# Patient Record
Sex: Male | Born: 1950 | Race: White | Hispanic: No | Marital: Married | Smoking: Former smoker
Health system: Southern US, Community
[De-identification: ages and names within clinical notes are randomized; demographics above are authoritative.]

## PROBLEM LIST (undated history)

## (undated) DIAGNOSIS — I2510 Atherosclerotic heart disease of native coronary artery without angina pectoris: Secondary | ICD-10-CM

## (undated) DIAGNOSIS — E279 Disorder of adrenal gland, unspecified: Secondary | ICD-10-CM

## (undated) DIAGNOSIS — I872 Venous insufficiency (chronic) (peripheral): Secondary | ICD-10-CM

## (undated) DIAGNOSIS — J45909 Unspecified asthma, uncomplicated: Secondary | ICD-10-CM

## (undated) DIAGNOSIS — I9589 Other hypotension: Secondary | ICD-10-CM

## (undated) DIAGNOSIS — G4733 Obstructive sleep apnea (adult) (pediatric): Secondary | ICD-10-CM

## (undated) DIAGNOSIS — K759 Inflammatory liver disease, unspecified: Secondary | ICD-10-CM

## (undated) DIAGNOSIS — J449 Chronic obstructive pulmonary disease, unspecified: Secondary | ICD-10-CM

## (undated) DIAGNOSIS — K279 Peptic ulcer, site unspecified, unspecified as acute or chronic, without hemorrhage or perforation: Secondary | ICD-10-CM

## (undated) DIAGNOSIS — E785 Hyperlipidemia, unspecified: Secondary | ICD-10-CM

## (undated) DIAGNOSIS — I219 Acute myocardial infarction, unspecified: Secondary | ICD-10-CM

## (undated) DIAGNOSIS — F5101 Primary insomnia: Secondary | ICD-10-CM

## (undated) DIAGNOSIS — I110 Hypertensive heart disease with heart failure: Secondary | ICD-10-CM

## (undated) DIAGNOSIS — R001 Bradycardia, unspecified: Secondary | ICD-10-CM

## (undated) DIAGNOSIS — E119 Type 2 diabetes mellitus without complications: Secondary | ICD-10-CM

## (undated) HISTORY — DX: Atherosclerotic heart disease of native coronary artery without angina pectoris: I25.10

## (undated) HISTORY — DX: Hypertensive heart disease with heart failure: I11.0

## (undated) HISTORY — DX: Type 2 diabetes mellitus without complications: E11.9

## (undated) HISTORY — PX: CARDIAC CATHETERIZATION: SHX172

## (undated) HISTORY — DX: Chronic obstructive pulmonary disease, unspecified: J44.9

## (undated) HISTORY — DX: Venous insufficiency (chronic) (peripheral): I87.2

## (undated) HISTORY — DX: Obstructive sleep apnea (adult) (pediatric): G47.33

## (undated) HISTORY — PX: CORONARY ARTERY BYPASS GRAFT: SHX141

## (undated) HISTORY — DX: Primary insomnia: F51.01

## (undated) HISTORY — DX: Other hypotension: I95.89

## (undated) HISTORY — DX: Peptic ulcer, site unspecified, unspecified as acute or chronic, without hemorrhage or perforation: K27.9

## (undated) HISTORY — DX: Hyperlipidemia, unspecified: E78.5

## (undated) HISTORY — DX: Disorder of adrenal gland, unspecified: E27.9

## (undated) HISTORY — DX: Acute myocardial infarction, unspecified: I21.9

## (undated) HISTORY — PX: CORONARY ANGIOPLASTY: SHX604

## (undated) HISTORY — PX: CATARACT EXTRACTION, BILATERAL: SHX1313

## (undated) HISTORY — DX: Unspecified asthma, uncomplicated: J45.909

## (undated) HISTORY — DX: Bradycardia, unspecified: R00.1

## (undated) HISTORY — PX: CARPAL TUNNEL RELEASE: SHX101

## (undated) HISTORY — DX: Inflammatory liver disease, unspecified: K75.9

---

## 2014-12-08 DIAGNOSIS — I2510 Atherosclerotic heart disease of native coronary artery without angina pectoris: Secondary | ICD-10-CM

## 2014-12-08 DIAGNOSIS — E785 Hyperlipidemia, unspecified: Secondary | ICD-10-CM

## 2014-12-08 HISTORY — DX: Atherosclerotic heart disease of native coronary artery without angina pectoris: I25.10

## 2014-12-08 HISTORY — DX: Hyperlipidemia, unspecified: E78.5

## 2015-03-03 DIAGNOSIS — I10 Essential (primary) hypertension: Secondary | ICD-10-CM

## 2015-03-03 HISTORY — DX: Essential (primary) hypertension: I10

## 2015-03-12 DIAGNOSIS — F419 Anxiety disorder, unspecified: Secondary | ICD-10-CM | POA: Diagnosis not present

## 2015-03-12 DIAGNOSIS — I110 Hypertensive heart disease with heart failure: Secondary | ICD-10-CM | POA: Diagnosis not present

## 2015-03-12 DIAGNOSIS — E785 Hyperlipidemia, unspecified: Secondary | ICD-10-CM | POA: Diagnosis not present

## 2015-03-12 DIAGNOSIS — R7303 Prediabetes: Secondary | ICD-10-CM | POA: Diagnosis not present

## 2015-03-12 DIAGNOSIS — I509 Heart failure, unspecified: Secondary | ICD-10-CM | POA: Diagnosis not present

## 2015-03-12 DIAGNOSIS — Z79899 Other long term (current) drug therapy: Secondary | ICD-10-CM | POA: Diagnosis not present

## 2015-03-12 DIAGNOSIS — F5105 Insomnia due to other mental disorder: Secondary | ICD-10-CM | POA: Diagnosis not present

## 2015-03-12 DIAGNOSIS — N401 Enlarged prostate with lower urinary tract symptoms: Secondary | ICD-10-CM | POA: Diagnosis not present

## 2015-03-12 DIAGNOSIS — E559 Vitamin D deficiency, unspecified: Secondary | ICD-10-CM | POA: Diagnosis not present

## 2015-06-30 DIAGNOSIS — Z0000 Encounter for general adult medical examination without abnormal findings: Secondary | ICD-10-CM | POA: Diagnosis not present

## 2015-06-30 DIAGNOSIS — Z6841 Body Mass Index (BMI) 40.0 and over, adult: Secondary | ICD-10-CM | POA: Diagnosis not present

## 2015-06-30 DIAGNOSIS — E785 Hyperlipidemia, unspecified: Secondary | ICD-10-CM | POA: Diagnosis not present

## 2015-06-30 DIAGNOSIS — Z1212 Encounter for screening for malignant neoplasm of rectum: Secondary | ICD-10-CM | POA: Diagnosis not present

## 2015-06-30 DIAGNOSIS — I10 Essential (primary) hypertension: Secondary | ICD-10-CM | POA: Diagnosis not present

## 2015-06-30 DIAGNOSIS — Z125 Encounter for screening for malignant neoplasm of prostate: Secondary | ICD-10-CM | POA: Diagnosis not present

## 2015-06-30 DIAGNOSIS — Z1211 Encounter for screening for malignant neoplasm of colon: Secondary | ICD-10-CM | POA: Diagnosis not present

## 2015-06-30 DIAGNOSIS — E559 Vitamin D deficiency, unspecified: Secondary | ICD-10-CM | POA: Diagnosis not present

## 2015-06-30 DIAGNOSIS — R7303 Prediabetes: Secondary | ICD-10-CM | POA: Diagnosis not present

## 2015-07-26 DIAGNOSIS — E785 Hyperlipidemia, unspecified: Secondary | ICD-10-CM | POA: Diagnosis not present

## 2015-07-26 DIAGNOSIS — Z79899 Other long term (current) drug therapy: Secondary | ICD-10-CM | POA: Diagnosis not present

## 2015-07-26 DIAGNOSIS — Z125 Encounter for screening for malignant neoplasm of prostate: Secondary | ICD-10-CM | POA: Diagnosis not present

## 2015-07-26 DIAGNOSIS — I509 Heart failure, unspecified: Secondary | ICD-10-CM | POA: Diagnosis not present

## 2015-07-26 DIAGNOSIS — Z1389 Encounter for screening for other disorder: Secondary | ICD-10-CM | POA: Diagnosis not present

## 2015-07-26 DIAGNOSIS — R7303 Prediabetes: Secondary | ICD-10-CM | POA: Diagnosis not present

## 2015-07-26 DIAGNOSIS — Z0000 Encounter for general adult medical examination without abnormal findings: Secondary | ICD-10-CM | POA: Diagnosis not present

## 2015-07-26 DIAGNOSIS — N401 Enlarged prostate with lower urinary tract symptoms: Secondary | ICD-10-CM | POA: Diagnosis not present

## 2015-07-26 DIAGNOSIS — I110 Hypertensive heart disease with heart failure: Secondary | ICD-10-CM | POA: Diagnosis not present

## 2015-07-26 DIAGNOSIS — Z6841 Body Mass Index (BMI) 40.0 and over, adult: Secondary | ICD-10-CM | POA: Diagnosis not present

## 2015-07-26 DIAGNOSIS — Z136 Encounter for screening for cardiovascular disorders: Secondary | ICD-10-CM | POA: Diagnosis not present

## 2015-07-26 DIAGNOSIS — Z9181 History of falling: Secondary | ICD-10-CM | POA: Diagnosis not present

## 2015-08-03 DIAGNOSIS — Z136 Encounter for screening for cardiovascular disorders: Secondary | ICD-10-CM | POA: Diagnosis not present

## 2015-08-03 DIAGNOSIS — I77811 Abdominal aortic ectasia: Secondary | ICD-10-CM | POA: Diagnosis not present

## 2015-08-03 DIAGNOSIS — Z87891 Personal history of nicotine dependence: Secondary | ICD-10-CM | POA: Diagnosis not present

## 2015-08-03 DIAGNOSIS — I700 Atherosclerosis of aorta: Secondary | ICD-10-CM | POA: Diagnosis not present

## 2015-08-16 DIAGNOSIS — Z961 Presence of intraocular lens: Secondary | ICD-10-CM | POA: Diagnosis not present

## 2015-11-11 DIAGNOSIS — Z6841 Body Mass Index (BMI) 40.0 and over, adult: Secondary | ICD-10-CM | POA: Diagnosis not present

## 2015-11-11 DIAGNOSIS — I2510 Atherosclerotic heart disease of native coronary artery without angina pectoris: Secondary | ICD-10-CM | POA: Diagnosis not present

## 2015-11-11 DIAGNOSIS — I10 Essential (primary) hypertension: Secondary | ICD-10-CM | POA: Diagnosis not present

## 2015-11-11 DIAGNOSIS — E785 Hyperlipidemia, unspecified: Secondary | ICD-10-CM | POA: Diagnosis not present

## 2016-02-03 DIAGNOSIS — I10 Essential (primary) hypertension: Secondary | ICD-10-CM | POA: Diagnosis not present

## 2016-02-03 DIAGNOSIS — Z87898 Personal history of other specified conditions: Secondary | ICD-10-CM | POA: Diagnosis not present

## 2016-02-03 DIAGNOSIS — F5105 Insomnia due to other mental disorder: Secondary | ICD-10-CM | POA: Diagnosis not present

## 2016-02-03 DIAGNOSIS — E785 Hyperlipidemia, unspecified: Secondary | ICD-10-CM | POA: Diagnosis not present

## 2016-02-03 DIAGNOSIS — F419 Anxiety disorder, unspecified: Secondary | ICD-10-CM | POA: Diagnosis not present

## 2016-02-03 DIAGNOSIS — R7303 Prediabetes: Secondary | ICD-10-CM | POA: Diagnosis not present

## 2016-02-03 DIAGNOSIS — N401 Enlarged prostate with lower urinary tract symptoms: Secondary | ICD-10-CM | POA: Diagnosis not present

## 2016-05-18 DIAGNOSIS — I10 Essential (primary) hypertension: Secondary | ICD-10-CM | POA: Diagnosis not present

## 2016-05-18 DIAGNOSIS — I2510 Atherosclerotic heart disease of native coronary artery without angina pectoris: Secondary | ICD-10-CM | POA: Diagnosis not present

## 2016-05-18 DIAGNOSIS — E785 Hyperlipidemia, unspecified: Secondary | ICD-10-CM | POA: Diagnosis not present

## 2016-05-24 DIAGNOSIS — I10 Essential (primary) hypertension: Secondary | ICD-10-CM | POA: Diagnosis not present

## 2016-05-24 DIAGNOSIS — E785 Hyperlipidemia, unspecified: Secondary | ICD-10-CM | POA: Diagnosis not present

## 2016-05-24 DIAGNOSIS — I2510 Atherosclerotic heart disease of native coronary artery without angina pectoris: Secondary | ICD-10-CM | POA: Diagnosis not present

## 2016-06-21 DIAGNOSIS — G4733 Obstructive sleep apnea (adult) (pediatric): Secondary | ICD-10-CM | POA: Diagnosis not present

## 2016-07-19 DIAGNOSIS — J301 Allergic rhinitis due to pollen: Secondary | ICD-10-CM | POA: Diagnosis not present

## 2016-07-19 DIAGNOSIS — G4733 Obstructive sleep apnea (adult) (pediatric): Secondary | ICD-10-CM | POA: Diagnosis not present

## 2016-07-19 DIAGNOSIS — R5383 Other fatigue: Secondary | ICD-10-CM | POA: Diagnosis not present

## 2016-07-19 DIAGNOSIS — J449 Chronic obstructive pulmonary disease, unspecified: Secondary | ICD-10-CM | POA: Diagnosis not present

## 2016-07-27 DIAGNOSIS — Z125 Encounter for screening for malignant neoplasm of prostate: Secondary | ICD-10-CM | POA: Diagnosis not present

## 2016-07-27 DIAGNOSIS — E669 Obesity, unspecified: Secondary | ICD-10-CM | POA: Diagnosis not present

## 2016-07-27 DIAGNOSIS — Z6841 Body Mass Index (BMI) 40.0 and over, adult: Secondary | ICD-10-CM | POA: Diagnosis not present

## 2016-07-27 DIAGNOSIS — I10 Essential (primary) hypertension: Secondary | ICD-10-CM | POA: Diagnosis not present

## 2016-07-27 DIAGNOSIS — E559 Vitamin D deficiency, unspecified: Secondary | ICD-10-CM | POA: Diagnosis not present

## 2016-07-27 DIAGNOSIS — Z136 Encounter for screening for cardiovascular disorders: Secondary | ICD-10-CM | POA: Diagnosis not present

## 2016-07-27 DIAGNOSIS — Z0000 Encounter for general adult medical examination without abnormal findings: Secondary | ICD-10-CM | POA: Diagnosis not present

## 2016-07-27 DIAGNOSIS — I2510 Atherosclerotic heart disease of native coronary artery without angina pectoris: Secondary | ICD-10-CM | POA: Diagnosis not present

## 2016-07-27 DIAGNOSIS — E785 Hyperlipidemia, unspecified: Secondary | ICD-10-CM | POA: Diagnosis not present

## 2016-08-15 DIAGNOSIS — I10 Essential (primary) hypertension: Secondary | ICD-10-CM | POA: Diagnosis not present

## 2016-08-15 DIAGNOSIS — E785 Hyperlipidemia, unspecified: Secondary | ICD-10-CM | POA: Diagnosis not present

## 2016-08-15 DIAGNOSIS — Z6841 Body Mass Index (BMI) 40.0 and over, adult: Secondary | ICD-10-CM | POA: Diagnosis not present

## 2016-08-15 DIAGNOSIS — I2510 Atherosclerotic heart disease of native coronary artery without angina pectoris: Secondary | ICD-10-CM | POA: Diagnosis not present

## 2016-08-23 DIAGNOSIS — G4733 Obstructive sleep apnea (adult) (pediatric): Secondary | ICD-10-CM | POA: Diagnosis not present

## 2016-08-30 DIAGNOSIS — J449 Chronic obstructive pulmonary disease, unspecified: Secondary | ICD-10-CM | POA: Diagnosis not present

## 2016-08-30 DIAGNOSIS — J301 Allergic rhinitis due to pollen: Secondary | ICD-10-CM | POA: Diagnosis not present

## 2016-08-30 DIAGNOSIS — R5383 Other fatigue: Secondary | ICD-10-CM | POA: Diagnosis not present

## 2016-08-30 DIAGNOSIS — G4733 Obstructive sleep apnea (adult) (pediatric): Secondary | ICD-10-CM | POA: Diagnosis not present

## 2016-09-04 DIAGNOSIS — F5105 Insomnia due to other mental disorder: Secondary | ICD-10-CM | POA: Diagnosis not present

## 2016-09-04 DIAGNOSIS — Z79899 Other long term (current) drug therapy: Secondary | ICD-10-CM | POA: Diagnosis not present

## 2016-09-04 DIAGNOSIS — G4733 Obstructive sleep apnea (adult) (pediatric): Secondary | ICD-10-CM | POA: Diagnosis not present

## 2016-09-04 DIAGNOSIS — F419 Anxiety disorder, unspecified: Secondary | ICD-10-CM | POA: Diagnosis not present

## 2016-09-04 DIAGNOSIS — E785 Hyperlipidemia, unspecified: Secondary | ICD-10-CM | POA: Diagnosis not present

## 2016-09-04 DIAGNOSIS — N401 Enlarged prostate with lower urinary tract symptoms: Secondary | ICD-10-CM | POA: Diagnosis not present

## 2016-09-04 DIAGNOSIS — R7303 Prediabetes: Secondary | ICD-10-CM | POA: Diagnosis not present

## 2016-09-04 DIAGNOSIS — I110 Hypertensive heart disease with heart failure: Secondary | ICD-10-CM | POA: Diagnosis not present

## 2016-09-05 DIAGNOSIS — G4733 Obstructive sleep apnea (adult) (pediatric): Secondary | ICD-10-CM | POA: Diagnosis not present

## 2016-09-05 DIAGNOSIS — J449 Chronic obstructive pulmonary disease, unspecified: Secondary | ICD-10-CM | POA: Diagnosis not present

## 2016-10-11 DIAGNOSIS — G4733 Obstructive sleep apnea (adult) (pediatric): Secondary | ICD-10-CM | POA: Diagnosis not present

## 2016-10-11 DIAGNOSIS — R5383 Other fatigue: Secondary | ICD-10-CM | POA: Diagnosis not present

## 2016-10-11 DIAGNOSIS — J449 Chronic obstructive pulmonary disease, unspecified: Secondary | ICD-10-CM | POA: Diagnosis not present

## 2016-10-11 DIAGNOSIS — J301 Allergic rhinitis due to pollen: Secondary | ICD-10-CM | POA: Diagnosis not present

## 2016-10-24 DIAGNOSIS — Z1211 Encounter for screening for malignant neoplasm of colon: Secondary | ICD-10-CM | POA: Diagnosis not present

## 2016-10-24 DIAGNOSIS — J301 Allergic rhinitis due to pollen: Secondary | ICD-10-CM | POA: Diagnosis not present

## 2016-10-31 DIAGNOSIS — G4733 Obstructive sleep apnea (adult) (pediatric): Secondary | ICD-10-CM | POA: Diagnosis not present

## 2016-11-20 ENCOUNTER — Other Ambulatory Visit: Payer: Self-pay | Admitting: Cardiology

## 2016-11-28 ENCOUNTER — Encounter (INDEPENDENT_AMBULATORY_CARE_PROVIDER_SITE_OTHER): Payer: Self-pay

## 2016-11-28 ENCOUNTER — Ambulatory Visit (INDEPENDENT_AMBULATORY_CARE_PROVIDER_SITE_OTHER): Payer: Medicare Other | Admitting: Cardiology

## 2016-11-28 ENCOUNTER — Encounter: Payer: Self-pay | Admitting: Cardiology

## 2016-11-28 VITALS — BP 110/60 | HR 64 | Resp 14 | Ht 67.0 in | Wt 256.1 lb

## 2016-11-28 DIAGNOSIS — I2510 Atherosclerotic heart disease of native coronary artery without angina pectoris: Secondary | ICD-10-CM

## 2016-11-28 DIAGNOSIS — G4733 Obstructive sleep apnea (adult) (pediatric): Secondary | ICD-10-CM | POA: Diagnosis not present

## 2016-11-28 DIAGNOSIS — E785 Hyperlipidemia, unspecified: Secondary | ICD-10-CM | POA: Diagnosis not present

## 2016-11-28 DIAGNOSIS — G4730 Sleep apnea, unspecified: Secondary | ICD-10-CM

## 2016-11-28 DIAGNOSIS — I10 Essential (primary) hypertension: Secondary | ICD-10-CM | POA: Diagnosis not present

## 2016-11-28 HISTORY — DX: Sleep apnea, unspecified: G47.30

## 2016-11-28 NOTE — Addendum Note (Signed)
Addended by: Kathyrn Sheriff on: 11/28/2016 12:05 PM   Modules accepted: Orders

## 2016-11-28 NOTE — Patient Instructions (Signed)
Medication Instructions:  Your physician recommends that you continue on your current medications as directed. Please refer to the Current Medication list given to you today.  Labwork: Your physician recommends that you have lab work today to check your cholesterol, kidney function, sodium and potassium level.   Testing/Procedures: None   Follow-Up: Your physician wants you to follow-up in: 6 months. You will receive a reminder letter in the mail two months in advance. If you don't receive a letter, please call our office to schedule the follow-up appointment.  Any Other Special Instructions Will Be Listed Below (If Applicable).  Please note that any paperwork needing to be filled out by the provider will need to be addressed at the front desk prior to seeing the provider. Please note that any paperwork FMLA, Disability or other documents regarding health condition is subject to a $25.00 charge that must be received prior to completion of paperwork in the form of a money order or check.    If you need a refill on your cardiac medications before your next appointment, please call your pharmacy.

## 2016-11-28 NOTE — Progress Notes (Signed)
Cardiology Office Note:    Date:  11/28/2016   ID:  Phillip Dunn, DOB 1951-02-18, MRN [id number]  PCP:  Melony Overly, MD  Cardiologist:  Jenne Campus, MD    Referring MD: Melony Overly, MD   Chief Complaint  Patient presents with  . Follow-up  Is doing well  History of Present Illness:    Phillip Dunn is a 66 y.o. male  with coronary artery disease. He is doing fine recently he initiated treatment for sleep apnea he feels significantly better. He does have more energy, he can work more. He consented for longer period of time. Is having any chest pain, tightness, squeezing, pressure, burning in the chest.  Past Medical History:  Diagnosis Date  . Adrenal disorder ([place])   . Asthma   . Benign hypertensive heart disease with CHF (congestive heart failure) ([place])   . Bradycardia   . COPD (chronic obstructive pulmonary disease) ([place])   . Coronary artery disease   . Diabetes mellitus without complication ([place])   . Hepatitis   . Hyperlipidemia   . Myocardial infarct ([place])   . OSA (obstructive sleep apnea)   . Peptic ulcer   . Primary insomnia   . Secondary hypotension   . Venous insufficiency of left lower extremity     Past Surgical History:  Procedure Laterality Date  . CATARACT EXTRACTION, BILATERAL    . CORONARY ARTERY BYPASS GRAFT      Current Medications: Current Meds  Medication Sig  . albuterol (VENTOLIN HFA) 108 (90 Base) MCG/ACT inhaler Inhale 2 puffs into the lungs every 4 (four) hours as needed for wheezing.  Marland Kitchen aspirin EC 81 MG tablet Take 81 mg by mouth daily.  Marland Kitchen atorvastatin (LIPITOR) 80 MG tablet Take 1 tablet by mouth daily.  . Coenzyme Q10 (COQ-10) 10 MG CAPS Take 10 mg by mouth daily.  . DOCOSAHEXAENOIC ACID PO Take 1 capsule by mouth daily.  . finasteride (PROSCAR) 5 MG tablet Take 1 tablet by mouth daily.  Marland Kitchen FLUoxetine (PROZAC) 20 MG tablet Take 20 mg by mouth daily.  . fluticasone (FLONASE) 50 MCG/ACT nasal spray Place 2 sprays  into both nostrils daily.  . fluticasone furoate-vilanterol (BREO ELLIPTA) 200-25 MCG/INH AEPB Inhale 1 puff into the lungs daily.  . furosemide (LASIX) 20 MG tablet Take 20 mg by mouth daily.  Marland Kitchen lisinopril (PRINIVIL,ZESTRIL) 2.5 MG tablet Take 1 tablet by mouth daily.  . metoprolol succinate (TOPROL-XL) 25 MG 24 hr tablet Take 12.5 mg by mouth daily.  . montelukast (SINGULAIR) 10 MG tablet Take 10 mg by mouth at bedtime.  . nitroGLYCERIN (NITROSTAT) 0.4 MG SL tablet Place 0.4 mg under the tongue as needed for chest pain.  . tamsulosin (FLOMAX) 0.4 MG CAPS capsule Take 0.4 mg by mouth daily.  . Vitamin D, Ergocalciferol, (DRISDOL) 50000 units CAPS capsule Take 1 capsule by mouth once a week.     Allergies:   Pollen extract   Social History   Social History  . Marital status: Married    Spouse name: N/A  . Number of children: N/A  . Years of education: N/A   Social History Main Topics  . Smoking status: Former Research scientist (life sciences)  . Smokeless tobacco: Never Used  . Alcohol use Yes  . Drug use: No  . Sexual activity: Not Asked   Other Topics Concern  . None   Social History Narrative  . None     Family History: The patient's family history includes Stroke in his  mother. ROS:   Please see the history of present illness.    All 14 point review of systems negative except as described per history of present illness  EKGs/Labs/Other Studies Reviewed:      Recent Labs: No results found for requested labs within last 8760 hours.  Recent Lipid Panel No results found for: CHOL, TRIG, HDL, CHOLHDL, VLDL, LDLCALC, LDLDIRECT  Physical Exam:    VS:  BP 110/60   Pulse 64   Resp 14   Ht 5\' 7"  (1.702 m)   Wt 256 lb 1.9 oz (116.2 kg)   BMI 40.11 kg/m     Wt Readings from Last 3 Encounters:  11/28/16 256 lb 1.9 oz (116.2 kg)     GEN:  Well nourished, well developed in no acute distress HEENT: Normal NECK: No JVD; No carotid bruits LYMPHATICS: No lymphadenopathy CARDIAC: RRR, no  murmurs, no rubs, no gallops RESPIRATORY:  Clear to auscultation without rales, wheezing or rhonchi  ABDOMEN: Soft, non-tender, non-distended MUSCULOSKELETAL:  No edema; No deformity  SKIN: Warm and dry LOWER EXTREMITIES: no swelling NEUROLOGIC:  Alert and oriented x 3 PSYCHIATRIC:  Normal affect   ASSESSMENT:    1. Coronary artery disease involving native coronary artery of native heart without angina pectoris   2. Dyslipidemia   3. Essential hypertension   4. Obstructive sleep apnea syndrome    PLAN:    In order of problems listed above:  1. Coronary artery disease: Asymptomatic, on appropriate medications which I will continue. 2. Dyslipidemia: I will continue present management I will check his fasting profile today. 3. Essential hypertension: Blood pressure well-controlled continue present management. 4. Obstructive sleep apnea on CPAP must that he use it religiously. He feels significantly better with it.  I will check his fasting lipid profile as well as Chem-7 today   Medication Adjustments/Labs and Tests Ordered: Current medicines are reviewed at length with the patient today.  Concerns regarding medicines are outlined above.  No orders of the defined types were placed in this encounter.  Medication changes: No orders of the defined types were placed in this encounter.   Signed, Park Liter, MD, [facility] 11/28/2016 11:57 AM    [place]

## 2016-11-29 LAB — BASIC METABOLIC PANEL
BUN/Creatinine Ratio: 13 (ref 10–24)
BUN: 10 mg/dL (ref 8–27)
CALCIUM: 9.6 mg/dL (ref 8.6–10.2)
CO2: 22 mmol/L (ref 20–29)
CREATININE: 0.75 mg/dL — AB (ref 0.76–1.27)
Chloride: 99 mmol/L (ref 96–106)
GFR calc Af Amer: 111 mL/min/{1.73_m2} (ref >59)
GFR, EST NON AFRICAN AMERICAN: 96 mL/min/{1.73_m2} (ref >59)
Glucose: 86 mg/dL (ref 65–99)
Potassium: 4.7 mmol/L (ref 3.5–5.2)
SODIUM: 135 mmol/L (ref 134–144)

## 2016-11-29 LAB — LIPID PANEL
CHOL/HDL RATIO: 2.7 ratio (ref 0.0–5.0)
Cholesterol, Total: 102 mg/dL (ref 100–199)
HDL: 38 mg/dL — AB (ref >39)
LDL Calculated: 49 mg/dL (ref 0–99)
Triglycerides: 75 mg/dL (ref 0–149)
VLDL Cholesterol Cal: 15 mg/dL (ref 5–40)

## 2016-12-13 ENCOUNTER — Other Ambulatory Visit: Payer: Self-pay | Admitting: Cardiology

## 2017-01-01 ENCOUNTER — Other Ambulatory Visit: Payer: Self-pay | Admitting: Cardiology

## 2017-01-12 DIAGNOSIS — G4733 Obstructive sleep apnea (adult) (pediatric): Secondary | ICD-10-CM | POA: Diagnosis not present

## 2017-01-15 DIAGNOSIS — G4733 Obstructive sleep apnea (adult) (pediatric): Secondary | ICD-10-CM | POA: Diagnosis not present

## 2017-01-15 DIAGNOSIS — R5383 Other fatigue: Secondary | ICD-10-CM | POA: Diagnosis not present

## 2017-01-15 DIAGNOSIS — J449 Chronic obstructive pulmonary disease, unspecified: Secondary | ICD-10-CM | POA: Diagnosis not present

## 2017-01-15 DIAGNOSIS — J301 Allergic rhinitis due to pollen: Secondary | ICD-10-CM | POA: Diagnosis not present

## 2017-02-02 DIAGNOSIS — K5730 Diverticulosis of large intestine without perforation or abscess without bleeding: Secondary | ICD-10-CM | POA: Diagnosis not present

## 2017-02-02 DIAGNOSIS — Z1211 Encounter for screening for malignant neoplasm of colon: Secondary | ICD-10-CM | POA: Diagnosis not present

## 2017-02-02 DIAGNOSIS — Z86010 Personal history of colonic polyps: Secondary | ICD-10-CM | POA: Diagnosis not present

## 2017-02-02 DIAGNOSIS — D126 Benign neoplasm of colon, unspecified: Secondary | ICD-10-CM | POA: Diagnosis not present

## 2017-02-02 DIAGNOSIS — D123 Benign neoplasm of transverse colon: Secondary | ICD-10-CM | POA: Diagnosis not present

## 2017-02-02 DIAGNOSIS — D128 Benign neoplasm of rectum: Secondary | ICD-10-CM | POA: Diagnosis not present

## 2017-03-13 DIAGNOSIS — Z79899 Other long term (current) drug therapy: Secondary | ICD-10-CM | POA: Diagnosis not present

## 2017-03-13 DIAGNOSIS — M25562 Pain in left knee: Secondary | ICD-10-CM | POA: Diagnosis not present

## 2017-03-13 DIAGNOSIS — F5105 Insomnia due to other mental disorder: Secondary | ICD-10-CM | POA: Diagnosis not present

## 2017-03-13 DIAGNOSIS — E559 Vitamin D deficiency, unspecified: Secondary | ICD-10-CM | POA: Diagnosis not present

## 2017-03-13 DIAGNOSIS — N401 Enlarged prostate with lower urinary tract symptoms: Secondary | ICD-10-CM | POA: Diagnosis not present

## 2017-03-13 DIAGNOSIS — F419 Anxiety disorder, unspecified: Secondary | ICD-10-CM | POA: Diagnosis not present

## 2017-03-13 DIAGNOSIS — E785 Hyperlipidemia, unspecified: Secondary | ICD-10-CM | POA: Diagnosis not present

## 2017-03-13 DIAGNOSIS — M179 Osteoarthritis of knee, unspecified: Secondary | ICD-10-CM | POA: Diagnosis not present

## 2017-03-13 DIAGNOSIS — M7731 Calcaneal spur, right foot: Secondary | ICD-10-CM | POA: Diagnosis not present

## 2017-03-13 DIAGNOSIS — J449 Chronic obstructive pulmonary disease, unspecified: Secondary | ICD-10-CM | POA: Diagnosis not present

## 2017-03-13 DIAGNOSIS — M25551 Pain in right hip: Secondary | ICD-10-CM | POA: Diagnosis not present

## 2017-03-13 DIAGNOSIS — M79604 Pain in right leg: Secondary | ICD-10-CM | POA: Diagnosis not present

## 2017-03-13 DIAGNOSIS — R7303 Prediabetes: Secondary | ICD-10-CM | POA: Diagnosis not present

## 2017-03-13 DIAGNOSIS — I110 Hypertensive heart disease with heart failure: Secondary | ICD-10-CM | POA: Diagnosis not present

## 2017-04-16 DIAGNOSIS — G4733 Obstructive sleep apnea (adult) (pediatric): Secondary | ICD-10-CM | POA: Diagnosis not present

## 2017-04-17 DIAGNOSIS — G4733 Obstructive sleep apnea (adult) (pediatric): Secondary | ICD-10-CM | POA: Diagnosis not present

## 2017-04-17 DIAGNOSIS — J449 Chronic obstructive pulmonary disease, unspecified: Secondary | ICD-10-CM | POA: Diagnosis not present

## 2017-04-30 ENCOUNTER — Other Ambulatory Visit: Payer: Self-pay | Admitting: Cardiology

## 2017-04-30 DIAGNOSIS — M5430 Sciatica, unspecified side: Secondary | ICD-10-CM | POA: Diagnosis not present

## 2017-04-30 DIAGNOSIS — M25559 Pain in unspecified hip: Secondary | ICD-10-CM | POA: Diagnosis not present

## 2017-04-30 DIAGNOSIS — M25569 Pain in unspecified knee: Secondary | ICD-10-CM | POA: Diagnosis not present

## 2017-05-01 DIAGNOSIS — M5416 Radiculopathy, lumbar region: Secondary | ICD-10-CM | POA: Diagnosis not present

## 2017-05-01 DIAGNOSIS — M79604 Pain in right leg: Secondary | ICD-10-CM | POA: Diagnosis not present

## 2017-05-04 DIAGNOSIS — M47816 Spondylosis without myelopathy or radiculopathy, lumbar region: Secondary | ICD-10-CM | POA: Diagnosis not present

## 2017-05-04 DIAGNOSIS — M545 Low back pain: Secondary | ICD-10-CM | POA: Diagnosis not present

## 2017-05-04 DIAGNOSIS — M5116 Intervertebral disc disorders with radiculopathy, lumbar region: Secondary | ICD-10-CM | POA: Diagnosis not present

## 2017-05-04 DIAGNOSIS — I77811 Abdominal aortic ectasia: Secondary | ICD-10-CM | POA: Diagnosis not present

## 2017-05-08 ENCOUNTER — Telehealth: Payer: Self-pay | Admitting: Cardiology

## 2017-05-08 DIAGNOSIS — M5416 Radiculopathy, lumbar region: Secondary | ICD-10-CM | POA: Diagnosis not present

## 2017-05-08 DIAGNOSIS — M5136 Other intervertebral disc degeneration, lumbar region: Secondary | ICD-10-CM | POA: Diagnosis not present

## 2017-05-08 NOTE — Telephone Encounter (Signed)
Patient states that he is having issues with his back.  He will either require epidural injections or surgery, and would like to know Dr Marthann Schiller thoughts on this.     Per Dr Raliegh Ip, pt was advised to start with epidural inections to see if this will be beneficial for the patient.  If injections are not successful, then proceed with surgery.  Pt verbalized understanding.    Pt advised he is due for routine 6 month flup appt, but he states he will call back to schedule at a later time.

## 2017-05-08 NOTE — Telephone Encounter (Signed)
Phillip Dunn would like a phone call regarding an upcoming spinal surgery.

## 2017-05-11 DIAGNOSIS — J439 Emphysema, unspecified: Secondary | ICD-10-CM | POA: Diagnosis not present

## 2017-05-11 DIAGNOSIS — I10 Essential (primary) hypertension: Secondary | ICD-10-CM | POA: Diagnosis not present

## 2017-05-11 DIAGNOSIS — Z87891 Personal history of nicotine dependence: Secondary | ICD-10-CM | POA: Diagnosis not present

## 2017-05-11 DIAGNOSIS — I2510 Atherosclerotic heart disease of native coronary artery without angina pectoris: Secondary | ICD-10-CM | POA: Diagnosis not present

## 2017-05-11 DIAGNOSIS — D3502 Benign neoplasm of left adrenal gland: Secondary | ICD-10-CM | POA: Diagnosis not present

## 2017-05-11 DIAGNOSIS — D3501 Benign neoplasm of right adrenal gland: Secondary | ICD-10-CM | POA: Diagnosis not present

## 2017-05-11 DIAGNOSIS — Z122 Encounter for screening for malignant neoplasm of respiratory organs: Secondary | ICD-10-CM | POA: Diagnosis not present

## 2017-05-11 DIAGNOSIS — K760 Fatty (change of) liver, not elsewhere classified: Secondary | ICD-10-CM | POA: Diagnosis not present

## 2017-05-21 ENCOUNTER — Other Ambulatory Visit: Payer: Self-pay | Admitting: Cardiology

## 2017-05-22 DIAGNOSIS — M5126 Other intervertebral disc displacement, lumbar region: Secondary | ICD-10-CM | POA: Diagnosis not present

## 2017-05-22 DIAGNOSIS — M5416 Radiculopathy, lumbar region: Secondary | ICD-10-CM | POA: Diagnosis not present

## 2017-05-30 DIAGNOSIS — J301 Allergic rhinitis due to pollen: Secondary | ICD-10-CM | POA: Diagnosis not present

## 2017-05-30 DIAGNOSIS — J449 Chronic obstructive pulmonary disease, unspecified: Secondary | ICD-10-CM | POA: Diagnosis not present

## 2017-05-30 DIAGNOSIS — G4733 Obstructive sleep apnea (adult) (pediatric): Secondary | ICD-10-CM | POA: Diagnosis not present

## 2017-06-19 ENCOUNTER — Ambulatory Visit (INDEPENDENT_AMBULATORY_CARE_PROVIDER_SITE_OTHER): Payer: Medicare Other | Admitting: Cardiology

## 2017-06-19 ENCOUNTER — Encounter: Payer: Self-pay | Admitting: Cardiology

## 2017-06-19 VITALS — BP 120/60 | HR 54 | Ht 67.0 in | Wt 257.4 lb

## 2017-06-19 DIAGNOSIS — I10 Essential (primary) hypertension: Secondary | ICD-10-CM

## 2017-06-19 DIAGNOSIS — I2510 Atherosclerotic heart disease of native coronary artery without angina pectoris: Secondary | ICD-10-CM

## 2017-06-19 DIAGNOSIS — E785 Hyperlipidemia, unspecified: Secondary | ICD-10-CM | POA: Diagnosis not present

## 2017-06-19 DIAGNOSIS — I2541 Coronary artery aneurysm: Secondary | ICD-10-CM | POA: Diagnosis not present

## 2017-06-19 DIAGNOSIS — G4733 Obstructive sleep apnea (adult) (pediatric): Secondary | ICD-10-CM

## 2017-06-19 HISTORY — DX: Coronary artery aneurysm: I25.41

## 2017-06-19 MED ORDER — CLOPIDOGREL BISULFATE 75 MG PO TABS: 75.0000 mg | ORAL_TABLET | Freq: Every day | ORAL | 3 refills | Status: DC

## 2017-06-19 NOTE — Progress Notes (Signed)
Cardiology Office Note:    Date:  06/19/2017   ID:  Phillip Dunn, DOB [birth year], MRN [id number]  PCP:  Melony Overly, MD  Cardiologist:  Jenne Campus, MD    Referring MD: Melony Overly, MD   Chief Complaint  Patient presents with  . Follow-up  Doing well  History of Present Illness:    Phillip Dunn is a 67 y.o. male with coronary artery disease overall doing well denies have any chest pain tightness squeezing pressure burning chest.  He did have a CT of his chest done for different reasons and there was a small measuring 40 mm aneurysm identified.  Denies having any symptoms still very active works fixing carburetor's.  Past Medical History:  Diagnosis Date  . Adrenal disorder ([place])   . Asthma   . Benign hypertensive heart disease with CHF (congestive heart failure) ([place])   . Bradycardia   . COPD (chronic obstructive pulmonary disease) ([place])   . Coronary artery disease   . Diabetes mellitus without complication ([place])   . Hepatitis   . Hyperlipidemia   . Myocardial infarct ([place])   . OSA (obstructive sleep apnea)   . Peptic ulcer   . Primary insomnia   . Secondary hypotension   . Venous insufficiency of left lower extremity     Past Surgical History:  Procedure Laterality Date  . CATARACT EXTRACTION, BILATERAL    . CORONARY ARTERY BYPASS GRAFT      Current Medications: Current Meds  Medication Sig  . albuterol (VENTOLIN HFA) 108 (90 Base) MCG/ACT inhaler Inhale 2 puffs into the lungs every 4 (four) hours as needed for wheezing.  Jearl Klinefelter ELLIPTA 62.5-25 MCG/INH AEPB Inhale 1 puff into the lungs daily.  Marland Kitchen aspirin EC 81 MG tablet Take 81 mg by mouth daily.  Marland Kitchen atorvastatin (LIPITOR) 80 MG tablet TAKE 1 TABLET EVERY DAY  . Coenzyme Q10 (COQ-10) 10 MG CAPS Take 10 mg by mouth daily.  . DOCOSAHEXAENOIC ACID PO Take 1 capsule by mouth daily.  . finasteride (PROSCAR) 5 MG tablet TAKE 1 TABLET EVERY DAY  . FLUoxetine (PROZAC) 20 MG tablet Take 20 mg  by mouth daily.  . fluticasone (FLONASE) 50 MCG/ACT nasal spray Place 2 sprays into both nostrils daily.  . furosemide (LASIX) 20 MG tablet Take 20 mg by mouth daily.  Marland Kitchen lisinopril (PRINIVIL,ZESTRIL) 2.5 MG tablet TAKE 1 TABLET EVERY DAY  . metoprolol succinate (TOPROL-XL) 25 MG 24 hr tablet Take 12.5 mg by mouth daily.  . montelukast (SINGULAIR) 10 MG tablet Take 10 mg by mouth at bedtime.  . nitroGLYCERIN (NITROSTAT) 0.4 MG SL tablet Place 0.4 mg under the tongue as needed for chest pain.  . tamsulosin (FLOMAX) 0.4 MG CAPS capsule Take 0.4 mg by mouth daily.  . Vitamin D, Ergocalciferol, (DRISDOL) 50000 units CAPS capsule Take 1 capsule by mouth once a week.     Allergies:   Pollen extract   Social History   Socioeconomic History  . Marital status: Married    Spouse name: None  . Number of children: None  . Years of education: None  . Highest education level: None  Social Needs  . Financial resource strain: None  . Food insecurity - worry: None  . Food insecurity - inability: None  . Transportation needs - medical: None  . Transportation needs - non-medical: None  Occupational History  . None  Tobacco Use  . Smoking status: Former Research scientist (life sciences)  . Smokeless tobacco: Never Used  Substance and  Sexual Activity  . Alcohol use: Yes  . Drug use: No  . Sexual activity: None  Other Topics Concern  . None  Social History Narrative  . None     Family History: The patient's family history includes Stroke in his mother. ROS:   Please see the history of present illness.    All 14 point review of systems negative except as described per history of present illness  EKGs/Labs/Other Studies Reviewed:      Recent Labs: 11/28/2016: BUN 10; Creatinine, Ser 0.75; Potassium 4.7; Sodium 135  Recent Lipid Panel    Component Value Date/Time   CHOL 102 11/28/2016 1211   TRIG 75 11/28/2016 1211   HDL 38 (L) 11/28/2016 1211   CHOLHDL 2.7 11/28/2016 1211   LDLCALC 49 11/28/2016 1211     Physical Exam:    VS:  BP 120/60   Pulse (!) 54   Ht 5\' 7"  (1.702 m)   Wt 257 lb 6.4 oz (116.8 kg)   SpO2 94%   BMI 40.31 kg/m     Wt Readings from Last 3 Encounters:  06/19/17 257 lb 6.4 oz (116.8 kg)  11/28/16 256 lb 1.9 oz (116.2 kg)     GEN:  Well nourished, well developed in no acute distress HEENT: Normal NECK: No JVD; No carotid bruits LYMPHATICS: No lymphadenopathy CARDIAC: RRR, no murmurs, no rubs, no gallops RESPIRATORY:  Clear to auscultation without rales, wheezing or rhonchi  ABDOMEN: Soft, non-tender, non-distended MUSCULOSKELETAL:  No edema; No deformity  SKIN: Warm and dry LOWER EXTREMITIES: no swelling NEUROLOGIC:  Alert and oriented x 3 PSYCHIATRIC:  Normal affect   ASSESSMENT:    1. Coronary artery disease involving native coronary artery of native heart without angina pectoris   2. Coronary artery aneurysm   3. Essential hypertension   4. Obstructive sleep apnea syndrome   5. Dyslipidemia    PLAN:    In order of problems listed above:  1. Coronary artery disease stable on appropriate medication which I will continue. 2. Coronary artery aneurysm.  I told him that there is no consensus about what to do with the situation however usual recommendation is to put him on dual antiplatelet agents.  Therefore I will add Plavix 75 mg daily to his medical regimen.  I will also repeat his CT in about 6 months after that was done which will be in 4 months from today. 3. Essential hypertension: Blood pressure well controlled continue present management. 4. Obstructive sleep apnea: Use mask on the regular basis. 5. Dyslipidemia: We will check his fasting lipid profile today.   Medication Adjustments/Labs and Tests Ordered: Current medicines are reviewed at length with the patient today.  Concerns regarding medicines are outlined above.  No orders of the defined types were placed in this encounter.  Medication changes: No orders of the defined types were  placed in this encounter.   Signed, Park Liter, MD, [facility] 06/19/2017 10:16 AM    [place]

## 2017-06-19 NOTE — Patient Instructions (Signed)
Medication Instructions:  Your physician has recommended you make the following change in your medication:  START clopidogrel (plavix) 75 mg daily  Labwork: Your physician recommends that you return for lab work in: lipid panel.  Testing/Procedures: None  Follow-Up: Your physician wants you to follow-up in: 4 months. You will receive a reminder letter in the mail two months in advance. If you don't receive a letter, please call our office to schedule the follow-up appointment.    Any Other Special Instructions Will Be Listed Below (If Applicable).     If you need a refill on your cardiac medications before your next appointment, please call your pharmacy.

## 2017-06-19 NOTE — Addendum Note (Signed)
Addended by: Austin Miles on: 06/19/2017 10:32 AM   Modules accepted: Orders

## 2017-06-20 LAB — LIPID PANEL
CHOLESTEROL TOTAL: 109 mg/dL (ref 100–199)
Chol/HDL Ratio: 2.5 ratio (ref 0.0–5.0)
HDL: 43 mg/dL (ref >39)
LDL Calculated: 53 mg/dL (ref 0–99)
TRIGLYCERIDES: 63 mg/dL (ref 0–149)
VLDL Cholesterol Cal: 13 mg/dL (ref 5–40)

## 2017-06-21 DIAGNOSIS — J441 Chronic obstructive pulmonary disease with (acute) exacerbation: Secondary | ICD-10-CM | POA: Diagnosis not present

## 2017-07-06 DIAGNOSIS — J449 Chronic obstructive pulmonary disease, unspecified: Secondary | ICD-10-CM | POA: Diagnosis not present

## 2017-07-06 DIAGNOSIS — J301 Allergic rhinitis due to pollen: Secondary | ICD-10-CM | POA: Diagnosis not present

## 2017-07-06 DIAGNOSIS — G4733 Obstructive sleep apnea (adult) (pediatric): Secondary | ICD-10-CM | POA: Diagnosis not present

## 2017-07-23 DIAGNOSIS — I10 Essential (primary) hypertension: Secondary | ICD-10-CM | POA: Diagnosis not present

## 2017-07-23 DIAGNOSIS — E785 Hyperlipidemia, unspecified: Secondary | ICD-10-CM | POA: Diagnosis not present

## 2017-07-23 DIAGNOSIS — R739 Hyperglycemia, unspecified: Secondary | ICD-10-CM | POA: Diagnosis not present

## 2017-07-23 DIAGNOSIS — E039 Hypothyroidism, unspecified: Secondary | ICD-10-CM | POA: Diagnosis not present

## 2017-07-23 DIAGNOSIS — N401 Enlarged prostate with lower urinary tract symptoms: Secondary | ICD-10-CM | POA: Diagnosis not present

## 2017-07-23 DIAGNOSIS — E559 Vitamin D deficiency, unspecified: Secondary | ICD-10-CM | POA: Diagnosis not present

## 2017-08-15 ENCOUNTER — Other Ambulatory Visit: Payer: Self-pay | Admitting: *Deleted

## 2017-08-15 MED ORDER — CLOPIDOGREL BISULFATE 75 MG PO TABS: 75.0000 mg | ORAL_TABLET | Freq: Every day | ORAL | 3 refills | Status: DC

## 2017-08-15 NOTE — Telephone Encounter (Signed)
Refill sent for clopidogrel (plavix) sent to [facility].

## 2017-08-21 DIAGNOSIS — M5416 Radiculopathy, lumbar region: Secondary | ICD-10-CM | POA: Diagnosis not present

## 2017-08-21 DIAGNOSIS — M5126 Other intervertebral disc displacement, lumbar region: Secondary | ICD-10-CM | POA: Diagnosis not present

## 2017-09-25 DIAGNOSIS — L03314 Cellulitis of groin: Secondary | ICD-10-CM | POA: Diagnosis not present

## 2017-09-25 DIAGNOSIS — S30861A Insect bite (nonvenomous) of abdominal wall, initial encounter: Secondary | ICD-10-CM | POA: Diagnosis not present

## 2017-10-03 ENCOUNTER — Encounter: Payer: Self-pay | Admitting: Cardiology

## 2017-10-03 ENCOUNTER — Ambulatory Visit (INDEPENDENT_AMBULATORY_CARE_PROVIDER_SITE_OTHER): Payer: Medicare Other | Admitting: Cardiology

## 2017-10-03 VITALS — BP 116/68 | HR 65 | Ht 67.0 in | Wt 259.0 lb

## 2017-10-03 DIAGNOSIS — I2510 Atherosclerotic heart disease of native coronary artery without angina pectoris: Secondary | ICD-10-CM | POA: Diagnosis not present

## 2017-10-03 DIAGNOSIS — G4733 Obstructive sleep apnea (adult) (pediatric): Secondary | ICD-10-CM | POA: Diagnosis not present

## 2017-10-03 DIAGNOSIS — E785 Hyperlipidemia, unspecified: Secondary | ICD-10-CM

## 2017-10-03 DIAGNOSIS — I10 Essential (primary) hypertension: Secondary | ICD-10-CM

## 2017-10-03 DIAGNOSIS — I2541 Coronary artery aneurysm: Secondary | ICD-10-CM | POA: Diagnosis not present

## 2017-10-03 NOTE — Patient Instructions (Addendum)
Medication Instructions:  Your physician recommends that you continue on your current medications as directed. Please refer to the Current Medication list given to you today.  Labwork: Your physician recommends that you have the following labs drawn: BMP  Testing/Procedures: Your physician has requested that you have cardiac CT. Cardiac computed tomography (CT) is a painless test that uses an x-ray machine to take clear, detailed pictures of your heart. For further information please visit [facility]tn. Please follow instruction sheet as given.  Please arrive at the [facility] main entrance of [facility] (you will be called with a date and time) (30-45 minutes prior to test start time)  [facility] [place], [place] [address] [phone]  Proceed to the [facility] Radiology Department (First Floor).  Please follow these instructions carefully (unless otherwise directed):  Hold all erectile dysfunction medications at least 48 hours prior to test.  On the Night Before the Test: . Drink plenty of water. . Do not consume any caffeinated/decaffeinated beverages or chocolate 12 hours prior to your test. . Do not take any antihistamines 12 hours prior to your test. . If you take Metformin do not take 24 hours prior to test.  On the Day of the Test: . Drink plenty of water. Do not drink any water within one hour of the test. . Do not eat any food 4 hours prior to the test. . You may take your regular medications prior to the test. . IF NOT ON A BETA BLOCKER - Take 50 mg of lopressor (metoprolol) one hour before the test. . HOLD Furosemide morning of the test.  After the Test: . Drink plenty of water. . After receiving IV contrast, you may experience a mild flushed feeling. This is normal. . On occasion, you may experience a mild rash up to 24 hours after the test. This is not dangerous. If this occurs, you can take Benadryl 25 mg and  increase your fluid intake. . If you experience trouble breathing, this can be serious. If it is severe call 911 IMMEDIATELY. If it is mild, please call our office. . If you take any of these medications: Glipizide/Metformin, Avandament, Glucavance, please do not take 48 hours after completing test.  Follow-Up: Your physician recommends that you schedule a follow-up appointment in: 5 months  Any Other Special Instructions Will Be Listed Below (If Applicable).   Someone from the scheduling department should be calling you within 2 weeks if you do not hear from their office please give [place] a call.  If you need a refill on your cardiac medications before your next appointment, please call your pharmacy.   [place], RN, BSN

## 2017-10-03 NOTE — Progress Notes (Signed)
Cardiology Office Note:    Date:  10/03/2017   ID:  Phillip Dunn, DOB 1950-05-07, MRN [id number]  PCP:  Melony Overly, MD  Cardiologist:  Jenne Campus, MD    Referring MD: Melony Overly, MD   No chief complaint on file. Doing well  History of Present Illness:    Phillip Dunn is a 67 y.o. male with coronary artery disease recently recognized coronary artery aneurysm.  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  Past Medical History:  Diagnosis Date  . Adrenal disorder ([place])   . Asthma   . Benign hypertensive heart disease with CHF (congestive heart failure) ([place])   . Bradycardia   . COPD (chronic obstructive pulmonary disease) ([place])   . Coronary artery disease   . Diabetes mellitus without complication ([place])   . Hepatitis   . Hyperlipidemia   . Myocardial infarct ([place])   . OSA (obstructive sleep apnea)   . Peptic ulcer   . Primary insomnia   . Secondary hypotension   . Venous insufficiency of left lower extremity     Past Surgical History:  Procedure Laterality Date  . CATARACT EXTRACTION, BILATERAL    . CORONARY ARTERY BYPASS GRAFT      Current Medications: Current Meds  Medication Sig  . albuterol (VENTOLIN HFA) 108 (90 Base) MCG/ACT inhaler Inhale 2 puffs into the lungs every 4 (four) hours as needed for wheezing.  Jearl Klinefelter ELLIPTA 62.5-25 MCG/INH AEPB Inhale 1 puff into the lungs daily.  Marland Kitchen aspirin EC 81 MG tablet Take 81 mg by mouth daily.  Marland Kitchen atorvastatin (LIPITOR) 80 MG tablet TAKE 1 TABLET EVERY DAY  . clopidogrel (PLAVIX) 75 MG tablet Take 1 tablet (75 mg total) by mouth daily.  . Coenzyme Q10 (COQ-10) 10 MG CAPS Take 10 mg by mouth daily.  . DOCOSAHEXAENOIC ACID PO Take 1 capsule by mouth daily.  . finasteride (PROSCAR) 5 MG tablet TAKE 1 TABLET EVERY DAY  .  FLUoxetine (PROZAC) 20 MG tablet Take 20 mg by mouth daily.  . fluticasone (FLONASE) 50 MCG/ACT nasal spray Place 2 sprays into both nostrils daily.  Marland Kitchen lisinopril (PRINIVIL,ZESTRIL) 2.5 MG tablet TAKE 1 TABLET EVERY DAY  . metoprolol succinate (TOPROL-XL) 25 MG 24 hr tablet Take 12.5 mg by mouth daily.  . montelukast (SINGULAIR) 10 MG tablet Take 10 mg by mouth at bedtime.  . nitroGLYCERIN (NITROSTAT) 0.4 MG SL tablet Place 0.4 mg under the tongue as needed for chest pain.  . tamsulosin (FLOMAX) 0.4 MG CAPS capsule Take 0.4 mg by mouth daily.  . Vitamin D, Ergocalciferol, (DRISDOL) 50000 units CAPS capsule Take 1 capsule by mouth once a week.     Allergies:   Pollen extract   Social History   Socioeconomic History  . Marital status: Married    Spouse name: Not on file  . Number of children: Not on file  . Years of education: Not on file  . Highest education level: Not on file  Occupational History  . Not on file  Social Needs  . Financial resource strain: Not on file  . Food insecurity:    Worry: Not on file    Inability: Not on file  . Transportation needs:    Medical: Not on file    Non-medical: Not on file  Tobacco Use  .  Smoking status: Former Research scientist (life sciences)  . Smokeless tobacco: Never Used  Substance and Sexual Activity  . Alcohol use: Yes  . Drug use: No  . Sexual activity: Not on file  Lifestyle  . Physical activity:    Days per week: Not on file    Minutes per session: Not on file  . Stress: Not on file  Relationships  . Social connections:    Talks on phone: Not on file    Gets together: Not on file    Attends religious service: Not on file    Active member of club or organization: Not on file    Attends meetings of clubs or organizations: Not on file    Relationship status: Not on file  Other Topics Concern  . Not on file  Social History Narrative  . Not on file     Family History: The patient's family history includes Stroke in his mother. ROS:   Please  see the history of present illness.    All 14 point review of systems negative except as described per history of present illness  EKGs/Labs/Other Studies Reviewed:      Recent Labs: 11/28/2016: BUN 10; Creatinine, Ser 0.75; Potassium 4.7; Sodium 135  Recent Lipid Panel    Component Value Date/Time   CHOL 109 06/19/2017 1040   TRIG 63 06/19/2017 1040   HDL 43 06/19/2017 1040   CHOLHDL 2.5 06/19/2017 1040   LDLCALC 53 06/19/2017 1040    Physical Exam:    VS:  BP 116/68 (BP Location: Right Arm, Patient Position: Sitting, Cuff Size: Normal)   Pulse 65   Ht 5\' 7"  (1.702 m)   Wt 259 lb (117.5 kg)   SpO2 92%   BMI 40.57 kg/m     Wt Readings from Last 3 Encounters:  10/03/17 259 lb (117.5 kg)  06/19/17 257 lb 6.4 oz (116.8 kg)  11/28/16 256 lb 1.9 oz (116.2 kg)     GEN:  Well nourished, well developed in no acute distress HEENT: Normal NECK: No JVD; No carotid bruits LYMPHATICS: No lymphadenopathy CARDIAC: RRR, no murmurs, no rubs, no gallops RESPIRATORY:  Clear to auscultation without rales, wheezing or rhonchi  ABDOMEN: Soft, non-tender, non-distended MUSCULOSKELETAL:  No edema; No deformity  SKIN: Warm and dry LOWER EXTREMITIES: no swelling NEUROLOGIC:  Alert and oriented x 3 PSYCHIATRIC:  Normal affect   ASSESSMENT:    1. Coronary artery aneurysm   2. Coronary artery disease involving native coronary artery of native heart without angina pectoris   3. Essential hypertension   4. Obstructive sleep apnea syndrome   5. Dyslipidemia    PLAN:    In order of problems listed above:  1. Coronary artery aneurysm in September we will repeat his CT it will be 6 months after first initial discovery.  We will continue dual antiplatelets therapy until that time 2. Coronary artery disease stable asymptomatic continue present management 3. Hypertension blood pressure well controlled continue present management obstructive sleep apnea use equipment on the regular  basis Dyslipidemia will call primary care physician to get fasting lipid profile   I see recheck in my office in about 5 months sooner if he get a problem we will do CT Angie of his heart in September   Medication Adjustments/Labs and Tests Ordered: Current medicines are reviewed at length with the patient today.  Concerns regarding medicines are outlined above.  No orders of the defined types were placed in this encounter.  Medication changes: No orders of the  defined types were placed in this encounter.   Signed, Park Liter, MD, [facility] 10/03/2017 4:04 PM    [facility]

## 2017-10-17 DIAGNOSIS — G4733 Obstructive sleep apnea (adult) (pediatric): Secondary | ICD-10-CM | POA: Diagnosis not present

## 2017-10-18 DIAGNOSIS — R5383 Other fatigue: Secondary | ICD-10-CM | POA: Diagnosis not present

## 2017-10-18 DIAGNOSIS — J449 Chronic obstructive pulmonary disease, unspecified: Secondary | ICD-10-CM | POA: Diagnosis not present

## 2017-10-18 DIAGNOSIS — J301 Allergic rhinitis due to pollen: Secondary | ICD-10-CM | POA: Diagnosis not present

## 2017-10-18 DIAGNOSIS — G4733 Obstructive sleep apnea (adult) (pediatric): Secondary | ICD-10-CM | POA: Diagnosis not present

## 2017-10-22 ENCOUNTER — Other Ambulatory Visit: Payer: Self-pay | Admitting: Cardiology

## 2017-10-24 DIAGNOSIS — J449 Chronic obstructive pulmonary disease, unspecified: Secondary | ICD-10-CM | POA: Diagnosis not present

## 2017-10-24 DIAGNOSIS — G4733 Obstructive sleep apnea (adult) (pediatric): Secondary | ICD-10-CM | POA: Diagnosis not present

## 2017-10-24 DIAGNOSIS — J301 Allergic rhinitis due to pollen: Secondary | ICD-10-CM | POA: Diagnosis not present

## 2017-11-02 ENCOUNTER — Other Ambulatory Visit: Payer: Self-pay

## 2017-11-22 DIAGNOSIS — E039 Hypothyroidism, unspecified: Secondary | ICD-10-CM | POA: Diagnosis not present

## 2017-11-22 DIAGNOSIS — N401 Enlarged prostate with lower urinary tract symptoms: Secondary | ICD-10-CM | POA: Diagnosis not present

## 2017-11-22 DIAGNOSIS — I10 Essential (primary) hypertension: Secondary | ICD-10-CM | POA: Diagnosis not present

## 2017-11-22 DIAGNOSIS — E785 Hyperlipidemia, unspecified: Secondary | ICD-10-CM | POA: Diagnosis not present

## 2017-11-22 DIAGNOSIS — R739 Hyperglycemia, unspecified: Secondary | ICD-10-CM | POA: Diagnosis not present

## 2017-11-23 DIAGNOSIS — G4733 Obstructive sleep apnea (adult) (pediatric): Secondary | ICD-10-CM | POA: Diagnosis not present

## 2017-11-23 DIAGNOSIS — J449 Chronic obstructive pulmonary disease, unspecified: Secondary | ICD-10-CM | POA: Diagnosis not present

## 2017-11-23 DIAGNOSIS — R0982 Postnasal drip: Secondary | ICD-10-CM | POA: Diagnosis not present

## 2017-11-23 DIAGNOSIS — J301 Allergic rhinitis due to pollen: Secondary | ICD-10-CM | POA: Diagnosis not present

## 2017-11-26 ENCOUNTER — Ambulatory Visit (HOSPITAL_COMMUNITY)
Admission: RE | Admit: 2017-11-26 | Discharge: 2017-11-26 | Disposition: A | Discharge status: Home / Self Care | Payer: Medicare Other | Source: Ambulatory Visit | Attending: Cardiology | Admitting: Cardiology

## 2017-11-26 DIAGNOSIS — I2510 Atherosclerotic heart disease of native coronary artery without angina pectoris: Secondary | ICD-10-CM | POA: Insufficient documentation

## 2017-11-26 DIAGNOSIS — E875 Hyperkalemia: Secondary | ICD-10-CM | POA: Diagnosis not present

## 2017-11-26 DIAGNOSIS — I2541 Coronary artery aneurysm: Secondary | ICD-10-CM | POA: Diagnosis not present

## 2017-11-26 DIAGNOSIS — Z955 Presence of coronary angioplasty implant and graft: Secondary | ICD-10-CM | POA: Diagnosis not present

## 2017-11-26 DIAGNOSIS — Z125 Encounter for screening for malignant neoplasm of prostate: Secondary | ICD-10-CM | POA: Diagnosis not present

## 2017-11-26 LAB — POCT I-STAT CREATININE: CREATININE: 0.7 mg/dL (ref 0.61–1.24)

## 2017-11-26 MED ORDER — IOPAMIDOL (ISOVUE-370) INJECTION 76%
80.0000 mL | Freq: Once | INTRAVENOUS | Status: AC | PRN
  Administered 2017-11-26: 80 mL via INTRAVENOUS

## 2017-11-26 MED ORDER — NITROGLYCERIN 0.4 MG SL SUBL
SUBLINGUAL_TABLET | SUBLINGUAL | Status: AC
  Filled 2017-11-26: qty 2

## 2017-11-26 MED ORDER — NITROGLYCERIN 0.4 MG SL SUBL
0.8000 mg | SUBLINGUAL_TABLET | SUBLINGUAL | Status: DC | PRN
  Administered 2017-11-26: 0.8 mg via SUBLINGUAL
  Filled 2017-11-26: qty 25

## 2017-11-29 DIAGNOSIS — Z125 Encounter for screening for malignant neoplasm of prostate: Secondary | ICD-10-CM | POA: Diagnosis not present

## 2017-11-29 DIAGNOSIS — Z1331 Encounter for screening for depression: Secondary | ICD-10-CM | POA: Diagnosis not present

## 2017-11-29 DIAGNOSIS — Z6841 Body Mass Index (BMI) 40.0 and over, adult: Secondary | ICD-10-CM | POA: Diagnosis not present

## 2017-11-29 DIAGNOSIS — E669 Obesity, unspecified: Secondary | ICD-10-CM | POA: Diagnosis not present

## 2017-11-29 DIAGNOSIS — Z9181 History of falling: Secondary | ICD-10-CM | POA: Diagnosis not present

## 2017-11-29 DIAGNOSIS — Z0000 Encounter for general adult medical examination without abnormal findings: Secondary | ICD-10-CM | POA: Diagnosis not present

## 2017-11-29 DIAGNOSIS — E785 Hyperlipidemia, unspecified: Secondary | ICD-10-CM | POA: Diagnosis not present

## 2017-11-29 DIAGNOSIS — Z136 Encounter for screening for cardiovascular disorders: Secondary | ICD-10-CM | POA: Diagnosis not present

## 2017-12-12 DIAGNOSIS — R600 Localized edema: Secondary | ICD-10-CM | POA: Diagnosis not present

## 2017-12-12 DIAGNOSIS — L0390 Cellulitis, unspecified: Secondary | ICD-10-CM | POA: Diagnosis not present

## 2017-12-12 DIAGNOSIS — M79662 Pain in left lower leg: Secondary | ICD-10-CM | POA: Diagnosis not present

## 2017-12-19 ENCOUNTER — Other Ambulatory Visit: Payer: Self-pay | Admitting: Cardiology

## 2017-12-25 DIAGNOSIS — Z961 Presence of intraocular lens: Secondary | ICD-10-CM | POA: Diagnosis not present

## 2018-02-18 DIAGNOSIS — G4733 Obstructive sleep apnea (adult) (pediatric): Secondary | ICD-10-CM | POA: Diagnosis not present

## 2018-02-19 DIAGNOSIS — R0982 Postnasal drip: Secondary | ICD-10-CM | POA: Diagnosis not present

## 2018-02-19 DIAGNOSIS — R0600 Dyspnea, unspecified: Secondary | ICD-10-CM | POA: Diagnosis not present

## 2018-02-19 DIAGNOSIS — I509 Heart failure, unspecified: Secondary | ICD-10-CM | POA: Diagnosis not present

## 2018-02-19 DIAGNOSIS — J301 Allergic rhinitis due to pollen: Secondary | ICD-10-CM | POA: Diagnosis not present

## 2018-02-19 DIAGNOSIS — J449 Chronic obstructive pulmonary disease, unspecified: Secondary | ICD-10-CM | POA: Diagnosis not present

## 2018-02-19 DIAGNOSIS — G4733 Obstructive sleep apnea (adult) (pediatric): Secondary | ICD-10-CM | POA: Diagnosis not present

## 2018-02-19 LAB — PULMONARY FUNCTION TEST

## 2018-02-20 ENCOUNTER — Encounter: Payer: Self-pay | Admitting: Cardiology

## 2018-02-20 ENCOUNTER — Other Ambulatory Visit: Payer: Self-pay | Admitting: Cardiology

## 2018-02-20 ENCOUNTER — Ambulatory Visit (INDEPENDENT_AMBULATORY_CARE_PROVIDER_SITE_OTHER): Payer: Medicare Other | Admitting: Cardiology

## 2018-02-20 VITALS — BP 100/58 | HR 55 | Ht 67.0 in | Wt 267.2 lb

## 2018-02-20 DIAGNOSIS — G4733 Obstructive sleep apnea (adult) (pediatric): Secondary | ICD-10-CM

## 2018-02-20 DIAGNOSIS — R0600 Dyspnea, unspecified: Secondary | ICD-10-CM | POA: Diagnosis not present

## 2018-02-20 DIAGNOSIS — I2541 Coronary artery aneurysm: Secondary | ICD-10-CM

## 2018-02-20 DIAGNOSIS — I10 Essential (primary) hypertension: Secondary | ICD-10-CM | POA: Diagnosis not present

## 2018-02-20 DIAGNOSIS — I2510 Atherosclerotic heart disease of native coronary artery without angina pectoris: Secondary | ICD-10-CM

## 2018-02-20 DIAGNOSIS — E785 Hyperlipidemia, unspecified: Secondary | ICD-10-CM

## 2018-02-20 MED ORDER — POTASSIUM CHLORIDE ER 10 MEQ PO TBCR: 10.0000 meq | EXTENDED_RELEASE_TABLET | Freq: Every day | ORAL | 0 refills | Status: DC

## 2018-02-20 NOTE — Patient Instructions (Signed)
Medication Instructions:  Your physician has recommended you make the following change in your medication:  START: Potassium 10 meq daily   If you need a refill on your cardiac medications before your next appointment, please call your pharmacy.   Lab work: Your physician recommends that you return for lab work today: BMP, CBC, BNP  If you have labs (blood work) drawn today and your tests are completely normal, you will receive your results only by: Marland Kitchen MyChart Message (if you have MyChart) OR . A paper copy in the mail If you have any lab test that is abnormal or we need to change your treatment, we will call you to review the results.  Testing/Procedures: None.   Follow-Up: At [facility], you and your health needs are our priority.  As part of our continuing mission to provide you with exceptional heart care, we have created designated Provider Care Teams.  These Care Teams include your primary Cardiologist (physician) and Advanced Practice Providers (APPs -  Physician Assistants and Nurse Practitioners) who all work together to provide you with the care you need, when you need it. You will need a follow up appointment in 1 weeks.  Please call our office 2 months in advance to schedule this appointment.  You may see No primary care provider on file. or another member of our [employer] Provider Team in : Shirlee More, MD . Jyl Heinz, MD  Any Other Special Instructions Will Be Listed Below (If Applicable).

## 2018-02-20 NOTE — Progress Notes (Signed)
Cardiology Office Note:    Date:  02/20/2018   ID:  Phillip Dunn, DOB 1950/12/03, MRN [id number]  PCP:  Melony Overly, MD  Cardiologist:  Jenne Campus, MD    Referring MD: Melony Overly, MD   Chief Complaint  Patient presents with  . Shortness of Breath  I am short of breath  History of Present Illness:    Phillip Dunn is a 67 y.o. male with coronary artery disease essential hypertension sleep apnea dyslipidemia comes today to office for follow-up for last 2 to 3 weeks he is being experiencing shortness of breath he gained at least 10 pounds he did see pulmonologist who recommended to start furosemide 40 mg twice daily no potassium.  He took 1 dose this morning and he urinated a lot.  Described to have some shortness of breath swelling of lower extremities he does have a large JVD he cannot sleep laying flat because of shortness of breath clearly look like he does have decompensated congestive heart failure.  No chest pain tightness squeezing pressure burning chest  Past Medical History:  Diagnosis Date  . Adrenal disorder ([place])   . Asthma   . Benign hypertensive heart disease with CHF (congestive heart failure) ([place])   . Bradycardia   . COPD (chronic obstructive pulmonary disease) ([place])   . Coronary artery disease   . Diabetes mellitus without complication ([place])   . Hepatitis   . Hyperlipidemia   . Myocardial infarct ([place])   . OSA (obstructive sleep apnea)   . Peptic ulcer   . Primary insomnia   . Secondary hypotension   . Venous insufficiency of left lower extremity     Past Surgical History:  Procedure Laterality Date  . CATARACT EXTRACTION, BILATERAL    . CORONARY ARTERY BYPASS GRAFT      Current Medications: Current Meds  Medication Sig  . albuterol (VENTOLIN HFA) 108 (90 Base) MCG/ACT inhaler Inhale 2 puffs into the lungs every 4 (four) hours as needed for wheezing.  Marland Kitchen aspirin EC 81 MG tablet Take 81 mg by mouth daily.  Marland Kitchen atorvastatin  (LIPITOR) 80 MG tablet TAKE 1 TABLET EVERY DAY  . clopidogrel (PLAVIX) 75 MG tablet Take 1 tablet (75 mg total) by mouth daily.  . Coenzyme Q10 (COQ-10) 10 MG CAPS Take 10 mg by mouth daily.  . DOCOSAHEXAENOIC ACID PO Take 1 capsule by mouth daily.  . finasteride (PROSCAR) 5 MG tablet TAKE 1 TABLET EVERY DAY  . FLUoxetine (PROZAC) 20 MG tablet Take 20 mg by mouth daily.  . fluticasone (FLONASE) 50 MCG/ACT nasal spray Place 2 sprays into both nostrils daily.  . Fluticasone Furoate-Vilanterol (BREO ELLIPTA IN) Inhale 1 puff into the lungs daily.  . furosemide (LASIX) 40 MG tablet Take 1 tablet by mouth 2 (two) times daily.  Marland Kitchen lisinopril (PRINIVIL,ZESTRIL) 2.5 MG tablet TAKE 1 TABLET EVERY DAY  . metoprolol succinate (TOPROL-XL) 25 MG 24 hr tablet Take 12.5 mg by mouth daily.  . nitroGLYCERIN (NITROSTAT) 0.4 MG SL tablet Place 0.4 mg under the tongue as needed for chest pain.  . tamsulosin (FLOMAX) 0.4 MG CAPS capsule Take 0.4 mg by mouth daily.  . Vitamin D, Ergocalciferol, (DRISDOL) 50000 units CAPS capsule Take 1 capsule by mouth once a week.     Allergies:   Pollen extract   Social History   Socioeconomic History  . Marital status: Married    Spouse name: Not on file  . Number of children: Not on file  .  Years of education: Not on file  . Highest education level: Not on file  Occupational History  . Not on file  Social Needs  . Financial resource strain: Not on file  . Food insecurity:    Worry: Not on file    Inability: Not on file  . Transportation needs:    Medical: Not on file    Non-medical: Not on file  Tobacco Use  . Smoking status: Former Research scientist (life sciences)  . Smokeless tobacco: Never Used  Substance and Sexual Activity  . Alcohol use: Yes  . Drug use: No  . Sexual activity: Not on file  Lifestyle  . Physical activity:    Days per week: Not on file    Minutes per session: Not on file  . Stress: Not on file  Relationships  . Social connections:    Talks on phone: Not on  file    Gets together: Not on file    Attends religious service: Not on file    Active member of club or organization: Not on file    Attends meetings of clubs or organizations: Not on file    Relationship status: Not on file  Other Topics Concern  . Not on file  Social History Narrative  . Not on file     Family History: The patient's family history includes Stroke in his mother. ROS:   Please see the history of present illness.    All 14 point review of systems negative except as described per history of present illness  EKGs/Labs/Other Studies Reviewed:      Recent Labs: 11/26/2017: Creatinine, Ser 0.70  Recent Lipid Panel    Component Value Date/Time   CHOL 109 06/19/2017 1040   TRIG 63 06/19/2017 1040   HDL 43 06/19/2017 1040   CHOLHDL 2.5 06/19/2017 1040   LDLCALC 53 06/19/2017 1040    Physical Exam:    VS:  BP (!) 100/58   Pulse (!) 55   Ht 5\' 7"  (1.702 m)   Wt 267 lb 3.2 oz (121.2 kg)   SpO2 90%   BMI 41.85 kg/m     Wt Readings from Last 3 Encounters:  02/20/18 267 lb 3.2 oz (121.2 kg)  10/03/17 259 lb (117.5 kg)  06/19/17 257 lb 6.4 oz (116.8 kg)     GEN:  Well nourished, well developed in no acute distress HEENT: Normal NECK: JVD bilaterally up to his earlobe even with sitting upright; No carotid bruits LYMPHATICS: No lymphadenopathy CARDIAC: RRR, no murmurs, no rubs, no gallops RESPIRATORY:  Clear to auscultation without rales, wheezing or rhonchi  ABDOMEN: Soft, non-tender, non-distended MUSCULOSKELETAL:  No edema; No deformity  SKIN: Warm and dry LOWER EXTREMITIES: 1+ swelling NEUROLOGIC:  Alert and oriented x 3 PSYCHIATRIC:  Normal affect   ASSESSMENT:    1. Coronary artery disease involving native coronary artery of native heart without angina pectoris   2. Coronary artery aneurysm   3. Essential hypertension   4. Obstructive sleep apnea syndrome   5. Dyslipidemia    PLAN:    In order of problems listed above:  1. Coronary  disease doing well from that point review. 2. Congestive heart failure which appears to be decompensated during the part of the problem he is the fact that he drinks 6 to 7 cups of coffee every single day plus some wine that he mixed with ice applied some tea I asked him to restrict his fluid he was given 40 mg of furosemide twice daily I will  reduce this to only 40 mg once a day I will give him 10 mg of potassium will check his Chem-7 as well as proBNP today I see him back in my office next week to see how he does. 3. Essential hypertension we have a present problem right now his blood pressure is low. 4. Sleep apnea he is using CPAP mask religiously. 5. Dyslipidemia he is on high intensity statin which I will continue.   Medication Adjustments/Labs and Tests Ordered: Current medicines are reviewed at length with the patient today.  Concerns regarding medicines are outlined above.  No orders of the defined types were placed in this encounter.  Medication changes: No orders of the defined types were placed in this encounter.   Signed, Park Liter, MD, [facility] 02/20/2018 2:05 PM    [place]

## 2018-02-21 LAB — BASIC METABOLIC PANEL
BUN / CREAT RATIO: 16 (ref 10–24)
BUN: 15 mg/dL (ref 8–27)
CHLORIDE: 103 mmol/L (ref 96–106)
CO2: 26 mmol/L (ref 20–29)
Calcium: 10.2 mg/dL (ref 8.6–10.2)
Creatinine, Ser: 0.93 mg/dL (ref 0.76–1.27)
GFR calc non Af Amer: 85 mL/min/{1.73_m2} (ref >59)
GFR, EST AFRICAN AMERICAN: 98 mL/min/{1.73_m2} (ref >59)
GLUCOSE: 88 mg/dL (ref 65–99)
Potassium: 5.5 mmol/L — ABNORMAL HIGH (ref 3.5–5.2)
SODIUM: 143 mmol/L (ref 134–144)

## 2018-02-21 LAB — CBC
HEMATOCRIT: 41.7 % (ref 37.5–51.0)
Hemoglobin: 13.5 g/dL (ref 13.0–17.7)
MCH: 27.1 pg (ref 26.6–33.0)
MCHC: 32.4 g/dL (ref 31.5–35.7)
MCV: 84 fL (ref 79–97)
PLATELETS: 172 10*3/uL (ref 150–450)
RBC: 4.98 x10E6/uL (ref 4.14–5.80)
RDW: 14.9 % (ref 12.3–15.4)
WBC: 6.8 10*3/uL (ref 3.4–10.8)

## 2018-02-21 LAB — PRO B NATRIURETIC PEPTIDE: NT-Pro BNP: 641 pg/mL — ABNORMAL HIGH (ref 0–376)

## 2018-02-22 ENCOUNTER — Ambulatory Visit: Payer: Medicare Other | Admitting: Cardiology

## 2018-02-25 ENCOUNTER — Telehealth: Payer: Self-pay

## 2018-02-25 ENCOUNTER — Other Ambulatory Visit: Payer: Self-pay

## 2018-02-25 DIAGNOSIS — Z5181 Encounter for therapeutic drug level monitoring: Secondary | ICD-10-CM

## 2018-02-25 DIAGNOSIS — Z79899 Other long term (current) drug therapy: Principal | ICD-10-CM

## 2018-02-25 NOTE — Telephone Encounter (Signed)
Left voicemail for the patient to call the office regarding results. 

## 2018-02-27 ENCOUNTER — Encounter: Payer: Self-pay | Admitting: Cardiology

## 2018-02-27 ENCOUNTER — Ambulatory Visit (INDEPENDENT_AMBULATORY_CARE_PROVIDER_SITE_OTHER): Payer: Medicare Other | Admitting: Cardiology

## 2018-02-27 VITALS — BP 124/64 | HR 51 | Ht 67.0 in | Wt 260.8 lb

## 2018-02-27 DIAGNOSIS — I10 Essential (primary) hypertension: Secondary | ICD-10-CM | POA: Diagnosis not present

## 2018-02-27 DIAGNOSIS — I2510 Atherosclerotic heart disease of native coronary artery without angina pectoris: Secondary | ICD-10-CM | POA: Diagnosis not present

## 2018-02-27 DIAGNOSIS — E785 Hyperlipidemia, unspecified: Secondary | ICD-10-CM

## 2018-02-27 NOTE — Progress Notes (Signed)
Cardiology Office Note:    Date:  02/27/2018   ID:  Phillip Dunn, DOB 1950-10-17, MRN [id number]  PCP:  Melony Overly, MD  Cardiologist:  Jenne Campus, MD    Referring MD: Melony Overly, MD   Chief Complaint  Patient presents with  . Follow-up    1 week  Doing better  History of Present Illness:    Phillip Dunn is a 67 y.o. male with coronary artery disease recently he showed up in my office being congested we discover a lot of dietary problems that he had he drinks plenty of fluid a lot of coffee eat salty food gradually he is getting adjusted to it and is modifying his diet with his wife she is very knowledgeable about salt and try to regulate it.  He lost 5 to 7 pounds since I seen him last time.  Complain about the fact that he pees a lot.  I will check his Chem-7 proBNP today and then will adjust medications as needed.  I am happy with the way he is progressing gradual weight loss would be appropriate for him.  Past Medical History:  Diagnosis Date  . 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 Asthma   . Benign hypertensive heart disease with CHF (congestive heart failure) ([place])   . Bradycardia   . COPD (chronic obstructive pulmonary disease) ([place])   . Coronary artery disease   . Diabetes mellitus without complication ([place])   . Hepatitis   . Hyperlipidemia   . Myocardial infarct ([place])   . OSA (obstructive sleep apnea)   . Peptic ulcer   . Primary insomnia   . Secondary hypotension   . Venous insufficiency of left lower extremity     Past Surgical History:  Procedure Laterality Date  . CATARACT EXTRACTION, BILATERAL    . CORONARY ARTERY BYPASS GRAFT      Current Medications: Current Meds  Medication Sig  . albuterol (VENTOLIN HFA) 108 (90 Base) MCG/ACT inhaler Inhale 2 puffs into the lungs every 4 (four) hours as needed for wheezing.  Marland Kitchen aspirin EC 81 MG tablet Take 81 mg by mouth daily.  Marland Kitchen atorvastatin (LIPITOR) 80 MG tablet TAKE 1 TABLET EVERY DAY  .  clopidogrel (PLAVIX) 75 MG tablet Take 1 tablet (75 mg total) by mouth daily.  . Coenzyme Q10 (COQ-10) 10 MG CAPS Take 10 mg by mouth daily.  . DOCOSAHEXAENOIC ACID PO Take 1 capsule by mouth daily.  . finasteride (PROSCAR) 5 MG tablet TAKE 1 TABLET EVERY DAY  . FLUoxetine (PROZAC) 20 MG tablet Take 20 mg by mouth daily.  . fluticasone (FLONASE) 50 MCG/ACT nasal spray Place 2 sprays into both nostrils daily.  . Fluticasone Furoate-Vilanterol (BREO ELLIPTA IN) Inhale 1 puff into the lungs daily.  . furosemide (LASIX) 40 MG tablet Take 1.5 tablets by mouth daily.  Marland Kitchen lisinopril (PRINIVIL,ZESTRIL) 2.5 MG tablet TAKE 1 TABLET EVERY DAY  . Melatonin 5 MG TABS Take 1 tablet by mouth as needed.  . metoprolol succinate (TOPROL-XL) 25 MG 24 hr tablet Take 12.5 mg by mouth daily.  . nitroGLYCERIN (NITROSTAT) 0.4 MG SL tablet Place 0.4 mg under the tongue as needed for chest pain.  . potassium chloride (K-DUR) 10 MEQ tablet Take 1 tablet (10 mEq total) by mouth daily.  . Probiotic Product (PROBIOTIC ADVANCED PO) Take by mouth.  . tamsulosin (FLOMAX) 0.4 MG CAPS capsule Take 0.4 mg by mouth daily.  . Vitamin D, Ergocalciferol, (DRISDOL) 50000 units CAPS capsule Take 1  capsule by mouth once a week.     Allergies:   Pollen extract   Social History   Socioeconomic History  . Marital status: Married    Spouse name: Not on file  . Number of children: Not on file  . Years of education: Not on file  . Highest education level: Not on file  Occupational History  . Not on file  Social Needs  . Financial resource strain: Not on file  . Food insecurity:    Worry: Not on file    Inability: Not on file  . Transportation needs:    Medical: Not on file    Non-medical: Not on file  Tobacco Use  . Smoking status: Former Research scientist (life sciences)  . Smokeless tobacco: Never Used  Substance and Sexual Activity  . Alcohol use: Yes  . Drug use: No  . Sexual activity: Not on file  Lifestyle  . Physical activity:    Days per  week: Not on file    Minutes per session: Not on file  . Stress: Not on file  Relationships  . Social connections:    Talks on phone: Not on file    Gets together: Not on file    Attends religious service: Not on file    Active member of club or organization: Not on file    Attends meetings of clubs or organizations: Not on file    Relationship status: Not on file  Other Topics Concern  . Not on file  Social History Narrative  . Not on file     Family History: The patient's family history includes Stroke in his mother. ROS:   Please see the history of present illness.    All 14 point review of systems negative except as described per history of present illness  EKGs/Labs/Other Studies Reviewed:      Recent Labs: 02/20/2018: BUN 15; Creatinine, Ser 0.93; Hemoglobin 13.5; NT-Pro BNP 641; Platelets 172; Potassium 5.5; Sodium 143  Recent Lipid Panel    Component Value Date/Time   CHOL 109 06/19/2017 1040   TRIG 63 06/19/2017 1040   HDL 43 06/19/2017 1040   CHOLHDL 2.5 06/19/2017 1040   LDLCALC 53 06/19/2017 1040    Physical Exam:    VS:  BP 124/64   Pulse (!) 51   Ht 5\' 7"  (1.702 m)   Wt 260 lb 12.8 oz (118.3 kg)   SpO2 91%   BMI 40.85 kg/m     Wt Readings from Last 3 Encounters:  02/27/18 260 lb 12.8 oz (118.3 kg)  02/20/18 267 lb 3.2 oz (121.2 kg)  10/03/17 259 lb (117.5 kg)     GEN:  Well nourished, well developed in no acute distress HEENT: Normal NECK: No JVD; No carotid bruits LYMPHATICS: No lymphadenopathy CARDIAC: RRR, no murmurs, no rubs, no gallops RESPIRATORY:  Clear to auscultation without rales, wheezing or rhonchi  ABDOMEN: Soft, non-tender, non-distended MUSCULOSKELETAL:  No edema; No deformity  SKIN: Warm and dry LOWER EXTREMITIES: 1+ swelling NEUROLOGIC:  Alert and oriented x 3 PSYCHIATRIC:  Normal affect   ASSESSMENT:    1. Coronary artery disease involving native coronary artery of native heart without angina pectoris   2.  Essential hypertension   3. Dyslipidemia    PLAN:    In order of problems listed above:  1. Coronary artery disease stable from that point review. 2. Diastolic congestive heart failure on diuresis which I will continue I will check Chem-7 proBNP to decide about possibly increasing dose of  furosemide 3. Essential hypertension blood pressure well controlled 4. Dyslipidemia on statin which I will continue   Medication Adjustments/Labs and Tests Ordered: Current medicines are reviewed at length with the patient today.  Concerns regarding medicines are outlined above.  No orders of the defined types were placed in this encounter.  Medication changes: No orders of the defined types were placed in this encounter.   Signed, Park Liter, MD, [facility] 02/27/2018 3:44 PM    [place]

## 2018-02-27 NOTE — Patient Instructions (Signed)
Medication Instructions:  Your physician recommends that you continue on your current medications as directed. Please refer to the Current Medication list given to you today.  If you need a refill on your cardiac medications before your next appointment, please call your pharmacy.   Lab work: Your physician recommends that you return for lab work today: BMP, BNP   If you have labs (blood work) drawn today and your tests are completely normal, you will receive your results only by: Marland Kitchen MyChart Message (if you have MyChart) OR . A paper copy in the mail If you have any lab test that is abnormal or we need to change your treatment, we will call you to review the results.  Testing/Procedures: None.    Follow-Up: At [facility], you and your health needs are our priority.  As part of our continuing mission to provide you with exceptional heart care, we have created designated Provider Care Teams.  These Care Teams include your primary Cardiologist (physician) and Advanced Practice Providers (APPs -  Physician Assistants and Nurse Practitioners) who all work together to provide you with the care you need, when you need it. You will need a follow up appointment in 1 months.  Please call our office 2 months in advance to schedule this appointment.  You may see No primary care provider on file. or another member of our [employer] Provider Team in [place]: Shirlee More, MD . Jyl Heinz, MD  Any Other Special Instructions Will Be Listed Below (If Applicable).

## 2018-02-28 LAB — BASIC METABOLIC PANEL
BUN / CREAT RATIO: 21 (ref 10–24)
BUN: 18 mg/dL (ref 8–27)
CO2: 25 mmol/L (ref 20–29)
Calcium: 9.9 mg/dL (ref 8.6–10.2)
Chloride: 102 mmol/L (ref 96–106)
Creatinine, Ser: 0.85 mg/dL (ref 0.76–1.27)
GFR, EST AFRICAN AMERICAN: 104 mL/min/{1.73_m2} (ref >59)
GFR, EST NON AFRICAN AMERICAN: 90 mL/min/{1.73_m2} (ref >59)
Glucose: 91 mg/dL (ref 65–99)
POTASSIUM: 4.7 mmol/L (ref 3.5–5.2)
Sodium: 142 mmol/L (ref 134–144)

## 2018-02-28 LAB — PRO B NATRIURETIC PEPTIDE: NT-Pro BNP: 557 pg/mL — ABNORMAL HIGH (ref 0–376)

## 2018-03-04 ENCOUNTER — Telehealth: Payer: Self-pay | Admitting: Emergency Medicine

## 2018-03-04 DIAGNOSIS — R0600 Dyspnea, unspecified: Secondary | ICD-10-CM

## 2018-03-04 DIAGNOSIS — I2510 Atherosclerotic heart disease of native coronary artery without angina pectoris: Secondary | ICD-10-CM

## 2018-03-04 DIAGNOSIS — I10 Essential (primary) hypertension: Secondary | ICD-10-CM

## 2018-03-04 MED ORDER — FUROSEMIDE 40 MG PO TABS: 80.0000 mg | ORAL_TABLET | Freq: Every day | ORAL | 3 refills | Status: DC

## 2018-03-04 NOTE — Telephone Encounter (Signed)
Patient informed of lab results and to increase furosemide to 80 mg daily and have labs drawn in 1 week. Patient verbally understands.

## 2018-03-05 DIAGNOSIS — G4733 Obstructive sleep apnea (adult) (pediatric): Secondary | ICD-10-CM | POA: Diagnosis not present

## 2018-03-05 DIAGNOSIS — R0982 Postnasal drip: Secondary | ICD-10-CM | POA: Diagnosis not present

## 2018-03-05 DIAGNOSIS — I509 Heart failure, unspecified: Secondary | ICD-10-CM | POA: Diagnosis not present

## 2018-03-05 DIAGNOSIS — M545 Low back pain: Secondary | ICD-10-CM | POA: Diagnosis not present

## 2018-03-05 DIAGNOSIS — R0600 Dyspnea, unspecified: Secondary | ICD-10-CM | POA: Diagnosis not present

## 2018-03-05 DIAGNOSIS — J301 Allergic rhinitis due to pollen: Secondary | ICD-10-CM | POA: Diagnosis not present

## 2018-03-05 DIAGNOSIS — J449 Chronic obstructive pulmonary disease, unspecified: Secondary | ICD-10-CM | POA: Diagnosis not present

## 2018-03-11 DIAGNOSIS — I10 Essential (primary) hypertension: Secondary | ICD-10-CM | POA: Diagnosis not present

## 2018-03-11 LAB — BASIC METABOLIC PANEL
BUN / CREAT RATIO: 21 (ref 10–24)
BUN: 18 mg/dL (ref 8–27)
CALCIUM: 9.7 mg/dL (ref 8.6–10.2)
CO2: 24 mmol/L (ref 20–29)
CREATININE: 0.87 mg/dL (ref 0.76–1.27)
Chloride: 97 mmol/L (ref 96–106)
GFR calc Af Amer: 103 mL/min/{1.73_m2} (ref >59)
GFR calc non Af Amer: 89 mL/min/{1.73_m2} (ref >59)
GLUCOSE: 63 mg/dL — AB (ref 65–99)
Potassium: 4.8 mmol/L (ref 3.5–5.2)
Sodium: 139 mmol/L (ref 134–144)

## 2018-03-18 ENCOUNTER — Ambulatory Visit: Payer: Medicare Other | Admitting: Cardiology

## 2018-03-29 ENCOUNTER — Ambulatory Visit (INDEPENDENT_AMBULATORY_CARE_PROVIDER_SITE_OTHER): Payer: Medicare Other | Admitting: Cardiology

## 2018-03-29 ENCOUNTER — Encounter: Payer: Self-pay | Admitting: Cardiology

## 2018-03-29 VITALS — BP 132/68 | HR 60 | Ht 67.0 in | Wt 256.4 lb

## 2018-03-29 DIAGNOSIS — I10 Essential (primary) hypertension: Secondary | ICD-10-CM | POA: Diagnosis not present

## 2018-03-29 DIAGNOSIS — E785 Hyperlipidemia, unspecified: Secondary | ICD-10-CM

## 2018-03-29 DIAGNOSIS — G4733 Obstructive sleep apnea (adult) (pediatric): Secondary | ICD-10-CM | POA: Diagnosis not present

## 2018-03-29 DIAGNOSIS — I2510 Atherosclerotic heart disease of native coronary artery without angina pectoris: Secondary | ICD-10-CM | POA: Diagnosis not present

## 2018-03-29 DIAGNOSIS — I2541 Coronary artery aneurysm: Secondary | ICD-10-CM

## 2018-03-29 NOTE — Addendum Note (Signed)
Addended by: Ashok Norris on: 03/29/2018 09:31 AM   Modules accepted: Orders

## 2018-03-29 NOTE — Progress Notes (Signed)
Cardiology Office Note:    Date:  03/29/2018   ID:  Phillip Dunn, DOB 11/21/1950, MRN [id number]  PCP:  Melony Overly, MD  Cardiologist:  Jenne Campus, MD    Referring MD: Melony Overly, MD   Chief Complaint  Patient presents with  . Follow-up  Doing much better  History of Present Illness:    Phillip Dunn is a 67 y.o. male with coronary artery disease, hypertension.  Recently he was having shortness of breath and weight gain a little bit more diuretic given also some instruction about diet and that seems to be helping shortness of but disappeared he is back to himself no chest pain tightness squeezing pressure burning chest.  I still think we need to repeat echocardiogram on him trying to find out why he got this episode of decompensation I suspect dietary discretion because the problem however will make sure his heart has preserved left ventricular ejection fraction  Past Medical History:  Diagnosis Date  . Adrenal disorder ([place])   . Asthma   . Benign hypertensive heart disease with CHF (congestive heart failure) ([place])   . Bradycardia   . COPD (chronic obstructive pulmonary disease) ([place])   . Coronary artery disease   . Diabetes mellitus without complication ([place])   . Hepatitis   . Hyperlipidemia   . Myocardial infarct ([place])   . OSA (obstructive sleep apnea)   . Peptic ulcer   . Primary insomnia   . Secondary hypotension   . Venous insufficiency of left lower extremity     Past Surgical History:  Procedure Laterality Date  . CATARACT EXTRACTION, BILATERAL    . CORONARY ARTERY BYPASS GRAFT      Current Medications: Current Meds  Medication Sig  . albuterol (VENTOLIN HFA) 108 (90 Base) MCG/ACT inhaler Inhale 2 puffs into the lungs every 4 (four) hours as needed for wheezing.  Marland Kitchen aspirin EC 81 MG tablet Take 81 mg by mouth daily.  Marland Kitchen atorvastatin (LIPITOR) 80 MG tablet TAKE 1 TABLET EVERY DAY  . clopidogrel (PLAVIX) 75 MG tablet Take 1 tablet  (75 mg total) by mouth daily.  . Coenzyme Q10 (COQ-10) 10 MG CAPS Take 10 mg by mouth daily.  . DOCOSAHEXAENOIC ACID PO Take 1 capsule by mouth daily.  . finasteride (PROSCAR) 5 MG tablet TAKE 1 TABLET EVERY DAY  . FLUoxetine (PROZAC) 20 MG tablet Take 20 mg by mouth daily.  . Fluticasone Furoate-Vilanterol (BREO ELLIPTA IN) Inhale 1 puff into the lungs daily.  . furosemide (LASIX) 40 MG tablet Take 2 tablets (80 mg total) by mouth daily.  Marland Kitchen ipratropium (ATROVENT) 0.03 % nasal spray Place 1 spray into both nostrils every 12 (twelve) hours.  Marland Kitchen lisinopril (PRINIVIL,ZESTRIL) 2.5 MG tablet TAKE 1 TABLET EVERY DAY  . Melatonin 5 MG TABS Take 1 tablet by mouth as needed.  . metoprolol succinate (TOPROL-XL) 25 MG 24 hr tablet Take 12.5 mg by mouth daily.  . nitroGLYCERIN (NITROSTAT) 0.4 MG SL tablet Place 0.4 mg under the tongue as needed for chest pain.  . potassium chloride (K-DUR) 10 MEQ tablet Take 1 tablet (10 mEq total) by mouth daily.  . Probiotic Product (PROBIOTIC ADVANCED PO) Take by mouth.  . tamsulosin (FLOMAX) 0.4 MG CAPS capsule Take 0.4 mg by mouth daily.  . Vitamin D, Ergocalciferol, (DRISDOL) 50000 units CAPS capsule Take 1 capsule by mouth once a week.     Allergies:   Pollen extract   Social History   Socioeconomic History  .  Marital status: Married    Spouse name: Not on file  . Number of children: Not on file  . Years of education: Not on file  . Highest education level: Not on file  Occupational History  . Not on file  Social Needs  . Financial resource strain: Not on file  . Food insecurity:    Worry: Not on file    Inability: Not on file  . Transportation needs:    Medical: Not on file    Non-medical: Not on file  Tobacco Use  . Smoking status: Former Research scientist (life sciences)  . Smokeless tobacco: Never Used  Substance and Sexual Activity  . Alcohol use: Yes  . Drug use: No  . Sexual activity: Not on file  Lifestyle  . Physical activity:    Days per week: Not on file     Minutes per session: Not on file  . Stress: Not on file  Relationships  . Social connections:    Talks on phone: Not on file    Gets together: Not on file    Attends religious service: Not on file    Active member of club or organization: Not on file    Attends meetings of clubs or organizations: Not on file    Relationship status: Not on file  Other Topics Concern  . Not on file  Social History Narrative  . Not on file     Family History: The patient's family history includes Stroke in his mother. ROS:   Please see the history of present illness.    All 14 point review of systems negative except as described per history of present illness  EKGs/Labs/Other Studies Reviewed:      Recent Labs: 02/20/2018: Hemoglobin 13.5; Platelets 172 02/27/2018: NT-Pro BNP 557 03/11/2018: BUN 18; Creatinine, Ser 0.87; Potassium 4.8; Sodium 139  Recent Lipid Panel    Component Value Date/Time   CHOL 109 06/19/2017 1040   TRIG 63 06/19/2017 1040   HDL 43 06/19/2017 1040   CHOLHDL 2.5 06/19/2017 1040   LDLCALC 53 06/19/2017 1040    Physical Exam:    VS:  BP 132/68   Pulse 60   Ht 5\' 7"  (1.702 m)   Wt 256 lb 6.4 oz (116.3 kg)   SpO2 96%   BMI 40.16 kg/m     Wt Readings from Last 3 Encounters:  03/29/18 256 lb 6.4 oz (116.3 kg)  02/27/18 260 lb 12.8 oz (118.3 kg)  02/20/18 267 lb 3.2 oz (121.2 kg)     GEN:  Well nourished, well developed in no acute distress HEENT: Normal NECK: No JVD; No carotid bruits LYMPHATICS: No lymphadenopathy CARDIAC: RRR, no murmurs, no rubs, no gallops RESPIRATORY:  Clear to auscultation without rales, wheezing or rhonchi  ABDOMEN: Soft, non-tender, non-distended MUSCULOSKELETAL:  No edema; No deformity  SKIN: Warm and dry LOWER EXTREMITIES: no swelling NEUROLOGIC:  Alert and oriented x 3 PSYCHIATRIC:  Normal affect   ASSESSMENT:    1. Coronary artery disease involving native coronary artery of native heart without angina pectoris   2.  Coronary artery aneurysm   3. Essential hypertension   4. Dyslipidemia   5. Obstructive sleep apnea syndrome    PLAN:    In order of problems listed above:  1. Coronary artery disease stable on appropriate medications which I will continue. 2. Coronary artery aneurysm asymptomatic 3. Essential hypertension blood pressure well controlled continue present management 4. Dyslipidemia we will check fasting lipid profile 5. Obstructive sleep apnea use CPAP  mask religiously.   Medication Adjustments/Labs and Tests Ordered: Current medicines are reviewed at length with the patient today.  Concerns regarding medicines are outlined above.  No orders of the defined types were placed in this encounter.  Medication changes: No orders of the defined types were placed in this encounter.   Signed, Park Liter, MD, [facility] 03/29/2018 9:18 AM    [place]

## 2018-03-29 NOTE — Patient Instructions (Signed)
Medication Instructions:  Your physician recommends that you continue on your current medications as directed. Please refer to the Current Medication list given to you today.  If you need a refill on your cardiac medications before your next appointment, please call your pharmacy.   Lab work: Your physician recommends that you return for lab work today: Lft, Lipids  If you have labs (blood work) drawn today and your tests are completely normal, you will receive your results only by: Marland Kitchen MyChart Message (if you have MyChart) OR . A paper copy in the mail If you have any lab test that is abnormal or we need to change your treatment, we will call you to review the results.  Testing/Procedures: Your physician has requested that you have an echocardiogram. Echocardiography is a painless test that uses sound waves to create images of your heart. It provides your doctor with information about the size and shape of your heart and how well your heart's chambers and valves are working. This procedure takes approximately one hour. There are no restrictions for this procedure.    Follow-Up: At [facility], you and your health needs are our priority.  As part of our continuing mission to provide you with exceptional heart care, we have created designated Provider Care Teams.  These Care Teams include your primary Cardiologist (physician) and Advanced Practice Providers (APPs -  Physician Assistants and Nurse Practitioners) who all work together to provide you with the care you need, when you need it. You will need a follow up appointment in 3 months.  Please call our office 2 months in advance to schedule this appointment.  You may see No primary care provider on file. or another member of our [employer] Provider Team in [place]: Shirlee More, MD . Jyl Heinz, MD  Any Other Special Instructions Will Be Listed Below (If Applicable).   Echocardiogram An echocardiogram is a procedure that uses  painless sound waves (ultrasound) to produce an image of the heart. Images from an echocardiogram can provide important information about:  Signs of coronary artery disease (CAD).  Aneurysm detection. An aneurysm is a weak or damaged part of an artery wall that bulges out from the normal force of blood pumping through the body.  Heart size and shape. Changes in the size or shape of the heart can be associated with certain conditions, including heart failure, aneurysm, and CAD.  Heart muscle function.  Heart valve function.  Signs of a past heart attack.  Fluid buildup around the heart.  Thickening of the heart muscle.  A tumor or infectious growth around the heart valves. Tell a health care provider about:  Any allergies you have.  All medicines you are taking, including vitamins, herbs, eye drops, creams, and over-the-counter medicines.  Any blood disorders you have.  Any surgeries you have had.  Any medical conditions you have.  Whether you are pregnant or may be pregnant. What are the risks? Generally, this is a safe procedure. However, problems may occur, including:  Allergic reaction to dye (contrast) that may be used during the procedure. What happens before the procedure? No specific preparation is needed. You may eat and drink normally. What happens during the procedure?   An IV tube may be inserted into one of your veins.  You may receive contrast through this tube. A contrast is an injection that improves the quality of the pictures from your heart.  A gel will be applied to your chest.  A wand-like tool (transducer)  will be moved over your chest. The gel will help to transmit the sound waves from the transducer.  The sound waves will harmlessly bounce off of your heart to allow the heart images to be captured in real-time motion. The images will be recorded on a computer. The procedure may vary among health care providers and hospitals. What happens after  the procedure?  You may return to your normal, everyday life, including diet, activities, and medicines, unless your health care provider tells you not to do that. Summary  An echocardiogram is a procedure that uses painless sound waves (ultrasound) to produce an image of the heart.  Images from an echocardiogram can provide important information about the size and shape of your heart, heart muscle function, heart valve function, and fluid buildup around your heart.  You do not need to do anything to prepare before this procedure. You may eat and drink normally.  After the echocardiogram is completed, you may return to your normal, everyday life, unless your health care provider tells you not to do that. This information is not intended to replace advice given to you by your health care provider. Make sure you discuss any questions you have with your health care provider. Document Released: 03/17/2000 Document Revised: 04/22/2016 Document Reviewed: 04/22/2016 Elsevier Interactive Patient Education  2019 [employer].

## 2018-03-30 LAB — HEPATIC FUNCTION PANEL
ALT: 38 IU/L (ref 0–44)
AST: 27 IU/L (ref 0–40)
Albumin: 4.2 g/dL (ref 3.6–4.8)
Alkaline Phosphatase: 97 IU/L (ref 39–117)
Bilirubin Total: 0.8 mg/dL (ref 0.0–1.2)
Bilirubin, Direct: 0.31 mg/dL (ref 0.00–0.40)
Total Protein: 6.7 g/dL (ref 6.0–8.5)

## 2018-03-30 LAB — LIPID PANEL
CHOLESTEROL TOTAL: 123 mg/dL (ref 100–199)
Chol/HDL Ratio: 2.2 ratio (ref 0.0–5.0)
HDL: 57 mg/dL (ref >39)
LDL Calculated: 57 mg/dL (ref 0–99)
TRIGLYCERIDES: 47 mg/dL (ref 0–149)
VLDL Cholesterol Cal: 9 mg/dL (ref 5–40)

## 2018-04-16 DIAGNOSIS — J301 Allergic rhinitis due to pollen: Secondary | ICD-10-CM | POA: Diagnosis not present

## 2018-04-16 DIAGNOSIS — G4733 Obstructive sleep apnea (adult) (pediatric): Secondary | ICD-10-CM | POA: Diagnosis not present

## 2018-04-16 DIAGNOSIS — J4540 Moderate persistent asthma, uncomplicated: Secondary | ICD-10-CM | POA: Diagnosis not present

## 2018-04-16 DIAGNOSIS — I509 Heart failure, unspecified: Secondary | ICD-10-CM | POA: Diagnosis not present

## 2018-04-16 DIAGNOSIS — R0982 Postnasal drip: Secondary | ICD-10-CM | POA: Diagnosis not present

## 2018-04-30 DIAGNOSIS — M5416 Radiculopathy, lumbar region: Secondary | ICD-10-CM | POA: Diagnosis not present

## 2018-04-30 DIAGNOSIS — M5126 Other intervertebral disc displacement, lumbar region: Secondary | ICD-10-CM | POA: Diagnosis not present

## 2018-05-09 ENCOUNTER — Ambulatory Visit (INDEPENDENT_AMBULATORY_CARE_PROVIDER_SITE_OTHER): Payer: Medicare Other

## 2018-05-09 DIAGNOSIS — I10 Essential (primary) hypertension: Secondary | ICD-10-CM | POA: Diagnosis not present

## 2018-05-09 DIAGNOSIS — I2541 Coronary artery aneurysm: Secondary | ICD-10-CM

## 2018-05-09 DIAGNOSIS — I2510 Atherosclerotic heart disease of native coronary artery without angina pectoris: Secondary | ICD-10-CM

## 2018-05-09 NOTE — Progress Notes (Signed)
Complete echocardiogram has been performed.  Jimmy Haivyn Oravec RDCS, RVT 

## 2018-05-22 ENCOUNTER — Other Ambulatory Visit: Payer: Self-pay | Admitting: Cardiology

## 2018-05-27 DIAGNOSIS — R739 Hyperglycemia, unspecified: Secondary | ICD-10-CM | POA: Diagnosis not present

## 2018-05-27 DIAGNOSIS — N401 Enlarged prostate with lower urinary tract symptoms: Secondary | ICD-10-CM | POA: Diagnosis not present

## 2018-05-27 DIAGNOSIS — E785 Hyperlipidemia, unspecified: Secondary | ICD-10-CM | POA: Diagnosis not present

## 2018-05-27 DIAGNOSIS — Z79899 Other long term (current) drug therapy: Secondary | ICD-10-CM | POA: Diagnosis not present

## 2018-05-27 DIAGNOSIS — I509 Heart failure, unspecified: Secondary | ICD-10-CM | POA: Diagnosis not present

## 2018-05-27 DIAGNOSIS — I10 Essential (primary) hypertension: Secondary | ICD-10-CM | POA: Diagnosis not present

## 2018-05-27 DIAGNOSIS — E039 Hypothyroidism, unspecified: Secondary | ICD-10-CM | POA: Diagnosis not present

## 2018-05-31 ENCOUNTER — Ambulatory Visit: Payer: Medicare Other | Admitting: Cardiology

## 2018-06-03 ENCOUNTER — Ambulatory Visit (INDEPENDENT_AMBULATORY_CARE_PROVIDER_SITE_OTHER): Payer: Medicare Other | Admitting: Cardiology

## 2018-06-03 ENCOUNTER — Encounter: Payer: Self-pay | Admitting: Cardiology

## 2018-06-03 VITALS — BP 114/64 | HR 65 | Ht 67.0 in | Wt 264.2 lb

## 2018-06-03 DIAGNOSIS — I10 Essential (primary) hypertension: Secondary | ICD-10-CM | POA: Diagnosis not present

## 2018-06-03 DIAGNOSIS — R601 Generalized edema: Secondary | ICD-10-CM | POA: Diagnosis not present

## 2018-06-03 DIAGNOSIS — R609 Edema, unspecified: Secondary | ICD-10-CM | POA: Diagnosis not present

## 2018-06-03 DIAGNOSIS — E785 Hyperlipidemia, unspecified: Secondary | ICD-10-CM

## 2018-06-03 DIAGNOSIS — G4733 Obstructive sleep apnea (adult) (pediatric): Secondary | ICD-10-CM | POA: Diagnosis not present

## 2018-06-03 DIAGNOSIS — I2541 Coronary artery aneurysm: Secondary | ICD-10-CM | POA: Diagnosis not present

## 2018-06-03 DIAGNOSIS — I2510 Atherosclerotic heart disease of native coronary artery without angina pectoris: Secondary | ICD-10-CM | POA: Diagnosis not present

## 2018-06-03 NOTE — Progress Notes (Signed)
Cardiology Office Note:    Date:  06/03/2018   ID:  [name], DOB 1950-10-22, MRN [id number]  PCP:  Renaldo Reel, PA  Cardiologist:  Jenne Campus, MD    Referring MD: Melony Overly, MD   Chief Complaint  Patient presents with  . Follow-up  Doing better  History of Present Illness:    Ajmal Kathan is a 68 y.o. male complex past medical history which include coronary artery disease essential hypertension coronary artery aneurysm sleep apnea dyslipidemia recently he noted some increased weight and he started feeling poorly.  His primary care physician did some Chem-7 apparently potassium was high and potassium p.o. was reduced.  70 chest pain tightness squeezing pressure burning chest he complained of being weak and tired.  Past Medical History:  Diagnosis Date  . 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 Asthma   . 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 Bradycardia   . COPD (chronic obstructive pulmonary disease) ([place])   . Coronary artery disease   . Diabetes mellitus without complication ([place])   . Hepatitis   . Hyperlipidemia   . Myocardial infarct ([place])   . OSA (obstructive sleep apnea)   . Peptic ulcer   . Primary insomnia   . Secondary hypotension   . Venous insufficiency of left lower extremity     Past Surgical History:  Procedure Laterality Date  . CATARACT EXTRACTION, BILATERAL    . CORONARY ARTERY BYPASS GRAFT      Current Medications: Current Meds  Medication Sig  . albuterol (VENTOLIN HFA) 108 (90 Base) MCG/ACT inhaler Inhale 2 puffs into the lungs every 4 (four) hours as needed for wheezing.  Marland Kitchen aspirin EC 81 MG tablet Take 81 mg by mouth daily.  Marland Kitchen atorvastatin (LIPITOR) 80 MG tablet TAKE 1 TABLET EVERY DAY  . clopidogrel (PLAVIX) 75 MG tablet Take 1 tablet (75 mg total) by mouth daily.  . Coenzyme Q10 (COQ-10) 10 MG CAPS Take 10 mg by mouth daily.  . DOCOSAHEXAENOIC ACID PO Take 1 capsule by mouth daily.    . finasteride (PROSCAR) 5 MG tablet TAKE 1 TABLET EVERY DAY  . FLUoxetine (PROZAC) 20 MG tablet Take 20 mg by mouth daily.  . Fluticasone Furoate-Vilanterol (BREO ELLIPTA IN) Inhale 1 puff into the lungs daily.  . furosemide (LASIX) 40 MG tablet Take 2 tablets (80 mg total) by mouth daily.  Marland Kitchen ipratropium (ATROVENT) 0.03 % nasal spray Place 1 spray into both nostrils every 12 (twelve) hours.  Marland Kitchen lisinopril (PRINIVIL,ZESTRIL) 2.5 MG tablet TAKE 1 TABLET EVERY DAY  . Melatonin 5 MG TABS Take 1 tablet by mouth as needed.  . metoprolol succinate (TOPROL-XL) 25 MG 24 hr tablet Take 12.5 mg by mouth daily.  . nitroGLYCERIN (NITROSTAT) 0.4 MG SL tablet Place 0.4 mg under the tongue as needed for chest pain.  . potassium chloride (K-DUR) 10 MEQ tablet TAKE 1 TABLET BY MOUTH ONCE DAILY (Patient taking differently: Take 5 mEq by mouth daily. )  . Probiotic Product (PROBIOTIC ADVANCED PO) Take by mouth.  . tamsulosin (FLOMAX) 0.4 MG CAPS capsule Take 0.4 mg by mouth daily.  . Vitamin D, Ergocalciferol, (DRISDOL) 50000 units CAPS capsule Take 1 capsule by mouth once a week.     Allergies:   Pollen extract   Social History   Socioeconomic History  . Marital status: Married    Spouse name: Not on file  . Number of children: Not on file  .  Years of education: Not on file  . Highest education level: Not on file  Occupational History  . Not on file  Social Needs  . Financial resource strain: Not on file  . Food insecurity:    Worry: Not on file    Inability: Not on file  . Transportation needs:    Medical: Not on file    Non-medical: Not on file  Tobacco Use  . Smoking status: Former Research scientist (life sciences)  . Smokeless tobacco: Never Used  Substance and Sexual Activity  . Alcohol use: Yes  . Drug use: No  . Sexual activity: Not on file  Lifestyle  . Physical activity:    Days per week: Not on file    Minutes per session: Not on file  . Stress: Not on file  Relationships  . Social connections:     Talks on phone: Not on file    Gets together: Not on file    Attends religious service: Not on file    Active member of club or organization: Not on file    Attends meetings of clubs or organizations: Not on file    Relationship status: Not on file  Other Topics Concern  . Not on file  Social History Narrative  . Not on file     Family History: The patient's family history includes Stroke in his mother. ROS:   Please see the history of present illness.    All 14 point review of systems negative except as described per history of present illness  EKGs/Labs/Other Studies Reviewed:      Recent Labs: 02/20/2018: Hemoglobin 13.5; Platelets 172 02/27/2018: NT-Pro BNP 557 03/11/2018: BUN 18; Creatinine, Ser 0.87; Potassium 4.8; Sodium 139 03/29/2018: ALT 38  Recent Lipid Panel    Component Value Date/Time   CHOL 123 03/29/2018 0933   TRIG 47 03/29/2018 0933   HDL 57 03/29/2018 0933   CHOLHDL 2.2 03/29/2018 0933   LDLCALC 57 03/29/2018 0933    Physical Exam:    VS:  BP 114/64   Pulse 65   Ht 5\' 7"  (1.702 m)   Wt 264 lb 3.2 oz (119.8 kg)   SpO2 95%   BMI 41.38 kg/m     Wt Readings from Last 3 Encounters:  06/03/18 264 lb 3.2 oz (119.8 kg)  03/29/18 256 lb 6.4 oz (116.3 kg)  02/27/18 260 lb 12.8 oz (118.3 kg)     GEN:  Well nourished, well developed in no acute distress HEENT: Normal NECK: No JVD; No carotid bruits LYMPHATICS: No lymphadenopathy CARDIAC: RRR, no murmurs, no rubs, no gallops RESPIRATORY:  Clear to auscultation without rales, wheezing or rhonchi  ABDOMEN: Soft, non-tender, non-distended MUSCULOSKELETAL:  No edema; No deformity  SKIN: Warm and dry LOWER EXTREMITIES: no swelling NEUROLOGIC:  Alert and oriented x 3 PSYCHIATRIC:  Normal affect   ASSESSMENT:    1. Essential hypertension   2. Coronary artery disease involving native coronary artery of native heart without angina pectoris   3. Coronary artery aneurysm   4. Obstructive sleep apnea  syndrome   5. Dyslipidemia    PLAN:    In order of problems listed above:  1. Essential hypertension blood pressure well controlled continue present management. 2. Coronary artery disease doing well from that point review. 3. Obstructive sleep apnea he uses CPAP mask religiously. 4. Dyslipidemia he is at high intensity statin which I will continue. 5. Diastolic congestive heart failure.  Will check Chem-7 as well as proBNP today.  I anticipate need  to increase dose of furosemide.   Medication Adjustments/Labs and Tests Ordered: Current medicines are reviewed at length with the patient today.  Concerns regarding medicines are outlined above.  No orders of the defined types were placed in this encounter.  Medication changes: No orders of the defined types were placed in this encounter.   Signed, Park Liter, MD, [facility] 06/03/2018 9:50 AM    [place]

## 2018-06-03 NOTE — Patient Instructions (Signed)
Medication Instructions:  Your physician recommends that you continue on your current medications as directed. Please refer to the Current Medication list given to you today.  If you need a refill on your cardiac medications before your next appointment, please call your pharmacy.   Lab work: Your physician recommends that you have a BMP and ProBNP drawn today.  If you have labs (blood work) drawn today and your tests are completely normal, you will receive your results only by: Marland Kitchen MyChart Message (if you have MyChart) OR . A paper copy in the mail If you have any lab test that is abnormal or we need to change your treatment, we will call you to review the results.  Testing/Procedures: NONE  Follow-Up: At [facility], you and your health needs are our priority.  As part of our continuing mission to provide you with exceptional heart care, we have created designated Provider Care Teams.  These Care Teams include your primary Cardiologist (physician) and Advanced Practice Providers (APPs -  Physician Assistants and Nurse Practitioners) who all work together to provide you with the care you need, when you need it. You will need a follow up appointment in 1 months.

## 2018-06-04 LAB — BASIC METABOLIC PANEL
BUN/Creatinine Ratio: 16 (ref 10–24)
BUN: 14 mg/dL (ref 8–27)
CHLORIDE: 101 mmol/L (ref 96–106)
CO2: 23 mmol/L (ref 20–29)
CREATININE: 0.87 mg/dL (ref 0.76–1.27)
Calcium: 9.4 mg/dL (ref 8.6–10.2)
GFR calc Af Amer: 103 mL/min/{1.73_m2} (ref >59)
GFR calc non Af Amer: 89 mL/min/{1.73_m2} (ref >59)
GLUCOSE: 88 mg/dL (ref 65–99)
Potassium: 4.7 mmol/L (ref 3.5–5.2)
Sodium: 142 mmol/L (ref 134–144)

## 2018-06-04 LAB — PRO B NATRIURETIC PEPTIDE: NT-Pro BNP: 397 pg/mL — ABNORMAL HIGH (ref 0–376)

## 2018-06-05 ENCOUNTER — Telehealth: Payer: Self-pay | Admitting: Emergency Medicine

## 2018-06-05 DIAGNOSIS — I10 Essential (primary) hypertension: Secondary | ICD-10-CM

## 2018-06-05 DIAGNOSIS — I2510 Atherosclerotic heart disease of native coronary artery without angina pectoris: Secondary | ICD-10-CM

## 2018-06-05 DIAGNOSIS — R0600 Dyspnea, unspecified: Secondary | ICD-10-CM

## 2018-06-05 MED ORDER — FUROSEMIDE 40 MG PO TABS: 120.0000 mg | ORAL_TABLET | Freq: Every day | ORAL | 2 refills | Status: DC

## 2018-06-05 NOTE — Telephone Encounter (Signed)
Patient informed of lab results. Patient advised to increase lasix to 120 mg daily and have labs redrawn in 1 week. Patient verbally understands all information and has not further questions.

## 2018-06-12 ENCOUNTER — Other Ambulatory Visit: Payer: Self-pay | Admitting: Emergency Medicine

## 2018-06-12 MED ORDER — POTASSIUM CHLORIDE ER 10 MEQ PO TBCR: 10.0000 meq | EXTENDED_RELEASE_TABLET | Freq: Every day | ORAL | 1 refills | Status: DC

## 2018-06-13 DIAGNOSIS — R0600 Dyspnea, unspecified: Secondary | ICD-10-CM | POA: Diagnosis not present

## 2018-06-13 DIAGNOSIS — I10 Essential (primary) hypertension: Secondary | ICD-10-CM | POA: Diagnosis not present

## 2018-06-13 DIAGNOSIS — I2510 Atherosclerotic heart disease of native coronary artery without angina pectoris: Secondary | ICD-10-CM | POA: Diagnosis not present

## 2018-06-14 LAB — BASIC METABOLIC PANEL
BUN / CREAT RATIO: 16 (ref 10–24)
BUN: 16 mg/dL (ref 8–27)
CO2: 24 mmol/L (ref 20–29)
CREATININE: 1.01 mg/dL (ref 0.76–1.27)
Calcium: 9.6 mg/dL (ref 8.6–10.2)
Chloride: 100 mmol/L (ref 96–106)
GFR calc non Af Amer: 76 mL/min/{1.73_m2} (ref >59)
GFR, EST AFRICAN AMERICAN: 88 mL/min/{1.73_m2} (ref >59)
Glucose: 83 mg/dL (ref 65–99)
Potassium: 4.6 mmol/L (ref 3.5–5.2)
SODIUM: 140 mmol/L (ref 134–144)

## 2018-06-14 LAB — PRO B NATRIURETIC PEPTIDE: NT-Pro BNP: 451 pg/mL — ABNORMAL HIGH (ref 0–376)

## 2018-06-17 NOTE — Telephone Encounter (Signed)
Rn relayed results to patient. He is currently not having any SOB, fatigue and overall feels well. Informed to continue on current daily regimen and contact office, if anything changes.

## 2018-06-24 ENCOUNTER — Other Ambulatory Visit: Payer: Self-pay | Admitting: Cardiology

## 2018-07-05 ENCOUNTER — Telehealth (INDEPENDENT_AMBULATORY_CARE_PROVIDER_SITE_OTHER): Payer: Medicare Other | Admitting: Cardiology

## 2018-07-05 ENCOUNTER — Other Ambulatory Visit: Payer: Self-pay

## 2018-07-05 ENCOUNTER — Encounter: Payer: Self-pay | Admitting: Cardiology

## 2018-07-05 VITALS — BP 119/71 | HR 51 | Wt 255.0 lb

## 2018-07-05 DIAGNOSIS — I2510 Atherosclerotic heart disease of native coronary artery without angina pectoris: Secondary | ICD-10-CM

## 2018-07-05 DIAGNOSIS — I10 Essential (primary) hypertension: Secondary | ICD-10-CM

## 2018-07-05 DIAGNOSIS — I2541 Coronary artery aneurysm: Secondary | ICD-10-CM

## 2018-07-05 DIAGNOSIS — G4733 Obstructive sleep apnea (adult) (pediatric): Secondary | ICD-10-CM

## 2018-07-05 DIAGNOSIS — E785 Hyperlipidemia, unspecified: Secondary | ICD-10-CM

## 2018-07-05 NOTE — Patient Instructions (Signed)
Medication Instructions:  Your physician recommends that you continue on your current medications as directed. Please refer to the Current Medication list given to you today.  If you need a refill on your cardiac medications before your next appointment, please call your pharmacy.   Lab work: Your physician recommends that you return for lab work in 2 weeks: Bmp  If you have labs (blood work) drawn today and your tests are completely normal, you will receive your results only by: . MyChart Message (if you have MyChart) OR . A paper copy in the mail If you have any lab test that is abnormal or we need to change your treatment, we will call you to review the results.  Testing/Procedures: None.   Follow-Up: At [facility], you and your health needs are our priority.  As part of our continuing mission to provide you with exceptional heart care, we have created designated Provider Care Teams.  These Care Teams include your primary Cardiologist (physician) and Advanced Practice Providers (APPs -  Physician Assistants and Nurse Practitioners) who all work together to provide you with the care you need, when you need it. You will need a follow up appointment in 1 months.  Please call our office 2 months in advance to schedule this appointment.  You may see No primary care provider on file. or another member of our [facility] Provider Team in [place]: Brian Munley, MD . Rajan Revankar, MD  Any Other Special Instructions Will Be Listed Below (If Applicable).     

## 2018-07-05 NOTE — Progress Notes (Signed)
Virtual Visit via Video Note    Evaluation Performed:  Follow-up visit  This visit type was conducted due to national recommendations for restrictions regarding the COVID-19 Pandemic (e.g. social distancing).  This format is felt to be most appropriate for this patient at this time.  All issues noted in this document were discussed and addressed.  No physical exam was performed (except for noted visual exam findings with Video Visits).  Please refer to the patient's chart (MyChart message for video visits and phone note for telephone visits) for the patient's consent to telehealth for [facility].  Date:  07/05/2018  ID: Phillip Dunn, DOB [birth year], MRN [id number]   Patient Location:  [place] [address]   Provider location:   [place] Office  PCP:  Renaldo Reel, PA  Cardiologist:  Jenne Campus, MD     Chief Complaint: Doing well  History of Present Illness:    Phillip Dunn is a 68 y.o. male  who presents via audio/video conferencing for a telehealth visit today.  With coronary artery disease, status post coronary artery bypass graft, essential hypertension, coronary artery aneurysm, sleep apnea, dyslipidemia.  I have seen him last time a few weeks ago when he noted increased weight we increase the dose of diuretic and he seems to be getting better he is weight is actually down by 10 pounds he said his legs are not swollen he said he is feeling fine and he is able to keep working.  He repairs jeeps carburetor's.  He is fairly busy and doing well from that point of view overall he is fine denies have any chest pain tightness squeezing pressure been chest no dizziness no passing out.   The patient does not have symptoms concerning for COVID-19 infection (fever, chills, cough, or new SHORTNESS OF BREATH).    Prior CV studies:   The following studies were reviewed today:       Past Medical History:  Diagnosis Date  . Adrenal  disorder ([place])   . Asthma   . 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 Bradycardia   . COPD (chronic obstructive pulmonary disease) ([place])   . Coronary artery disease   . Diabetes mellitus without complication ([place])   . Hepatitis   . Hyperlipidemia   . Myocardial infarct ([place])   . OSA (obstructive sleep apnea)   . Peptic ulcer   . Primary insomnia   . Secondary hypotension   . Venous insufficiency of left lower extremity     Past Surgical History:  Procedure Laterality Date  . CATARACT EXTRACTION, BILATERAL    . CORONARY ARTERY BYPASS GRAFT       Current Meds  Medication Sig  . albuterol (VENTOLIN HFA) 108 (90 Base) MCG/ACT inhaler Inhale 2 puffs into the lungs every 4 (four) hours as needed for wheezing.  Marland Kitchen aspirin EC 81 MG tablet Take 81 mg by mouth daily.  Marland Kitchen atorvastatin (LIPITOR) 80 MG tablet TAKE 1 TABLET EVERY DAY  . b complex vitamins tablet Take 1 tablet by mouth daily.  . budesonide-formoterol (SYMBICORT) 160-4.5 MCG/ACT inhaler Inhale 2 puffs into the lungs 2 (two) times daily.  . clopidogrel (PLAVIX) 75 MG tablet TAKE 1 TABLET EVERY DAY  . Coenzyme Q10 (COQ-10) 10 MG CAPS Take 10 mg by mouth daily.  . DOCOSAHEXAENOIC ACID PO Take 1 capsule by mouth daily.  . finasteride (PROSCAR) 5 MG tablet TAKE 1 TABLET EVERY DAY  .  FLUoxetine (PROZAC) 20 MG tablet Take 20 mg by mouth daily.  . furosemide (LASIX) 40 MG tablet TAKE 3 TABLETS EVERY DAY  . ipratropium (ATROVENT) 0.03 % nasal spray Place 1 spray into both nostrils every 12 (twelve) hours.  Marland Kitchen lisinopril (PRINIVIL,ZESTRIL) 2.5 MG tablet TAKE 1 TABLET EVERY DAY  . Melatonin 5 MG TABS Take 1 tablet by mouth as needed.  . metoprolol succinate (TOPROL-XL) 25 MG 24 hr tablet Take 12.5 mg by mouth daily.  . nitroGLYCERIN (NITROSTAT) 0.4 MG SL tablet Place 0.4 mg under the tongue as needed for chest pain.  . potassium chloride (K-DUR) 10 MEQ tablet Take 1 tablet (10 mEq total) by mouth  daily.  . Probiotic Product (PROBIOTIC ADVANCED PO) Take by mouth.  . tamsulosin (FLOMAX) 0.4 MG CAPS capsule Take 0.4 mg by mouth daily.  . vitamin C (ASCORBIC ACID) 500 MG tablet Take 500 mg by mouth daily.  . Vitamin D, Ergocalciferol, (DRISDOL) 50000 units CAPS capsule Take 1 capsule by mouth once a week.      Family History: The patient's family history includes Stroke in his mother.   ROS:   Please see the history of present illness.     All other systems reviewed and are negative.   Labs/Other Tests and Data Reviewed:     Recent Labs: 02/20/2018: Hemoglobin 13.5; Platelets 172 03/29/2018: ALT 38 06/13/2018: BUN 16; Creatinine, Ser 1.01; NT-Pro BNP 451; Potassium 4.6; Sodium 140  Recent Lipid Panel    Component Value Date/Time   CHOL 123 03/29/2018 0933   TRIG 47 03/29/2018 0933   HDL 57 03/29/2018 0933   CHOLHDL 2.2 03/29/2018 0933   LDLCALC 57 03/29/2018 0933      Exam:    Vital Signs:  BP 119/71   Pulse (!) 51   Wt 255 lb (115.7 kg)   SpO2 97%   BMI 39.94 kg/m     Wt Readings from Last 3 Encounters:  07/05/18 255 lb (115.7 kg)  06/03/18 264 lb 3.2 oz (119.8 kg)  03/29/18 256 lb 6.4 oz (116.3 kg)     Well nourished, well developed male in no acute distress. Is alert awake oriented x3, quite tearful on the phone he is very happy to be able to talk to me does not have any JVD he showed me his legs does not swollen.  Diagnosis for this visit:   1. Coronary artery disease involving native coronary artery of native heart without angina pectoris   2. Essential hypertension   3. Coronary artery aneurysm   4. Dyslipidemia   5. Obstructive sleep apnea syndrome      ASSESSMENT & PLAN:    1.  Coronary disease doing well from that point review asymptomatic. 2.  Essential hypertension blood pressure well controlled continue present management. 3.  History of coronary artery aneurysm.  He supposed to have CT of his chest to look at the aneurysm however  because of coronavirus situation that being postponed.  This is a test that we will do 1 situation would stabilize.  Luckily he is asymptomatic. 4.  Dyslipidemia he is taking appropriate medications.  That include atorvastatin 80 mg daily continue. 5.  Obstructive sleep apnea using CPAP mask on the regular basis.  COVID-19 Education: The signs and symptoms of COVID-19 were discussed with the patient and how to seek care for testing (follow up with PCP or arrange E-visit).  The importance of social distancing was discussed today.  Patient Risk:   After full review  of this patients clinical status, I feel that they are at least moderate risk at this time.  Time:   Today, I have spent 14 minutes with the patient with telehealth technology discussing pt health issues. Visit was finished at 9:00 AM.    Medication Adjustments/Labs and Tests Ordered: Current medicines are reviewed at length with the patient today.  Concerns regarding medicines are outlined above.  No orders of the defined types were placed in this encounter.  Medication changes: No orders of the defined types were placed in this encounter.    Disposition: Follow-up through the video visit in about a month 2 weeks from now we will do Chem-7  Signed, Park Liter, MD, [facility] 07/05/2018 8:56 AM    [place]

## 2018-07-08 ENCOUNTER — Ambulatory Visit: Payer: Medicare Other | Admitting: Cardiology

## 2018-07-16 DIAGNOSIS — J4540 Moderate persistent asthma, uncomplicated: Secondary | ICD-10-CM | POA: Diagnosis not present

## 2018-07-16 DIAGNOSIS — G4733 Obstructive sleep apnea (adult) (pediatric): Secondary | ICD-10-CM | POA: Diagnosis not present

## 2018-07-16 DIAGNOSIS — J301 Allergic rhinitis due to pollen: Secondary | ICD-10-CM | POA: Diagnosis not present

## 2018-07-16 DIAGNOSIS — I509 Heart failure, unspecified: Secondary | ICD-10-CM | POA: Diagnosis not present

## 2018-07-16 DIAGNOSIS — R0982 Postnasal drip: Secondary | ICD-10-CM | POA: Diagnosis not present

## 2018-07-19 ENCOUNTER — Telehealth: Payer: Self-pay | Admitting: Emergency Medicine

## 2018-07-19 DIAGNOSIS — I10 Essential (primary) hypertension: Secondary | ICD-10-CM | POA: Diagnosis not present

## 2018-07-19 DIAGNOSIS — M545 Low back pain: Secondary | ICD-10-CM | POA: Diagnosis not present

## 2018-07-19 LAB — BASIC METABOLIC PANEL
BUN/Creatinine Ratio: 16 (ref 10–24)
BUN: 14 mg/dL (ref 8–27)
CO2: 24 mmol/L (ref 20–29)
Calcium: 9.5 mg/dL (ref 8.6–10.2)
Chloride: 100 mmol/L (ref 96–106)
Creatinine, Ser: 0.87 mg/dL (ref 0.76–1.27)
GFR calc Af Amer: 103 mL/min/{1.73_m2} (ref >59)
GFR calc non Af Amer: 89 mL/min/{1.73_m2} (ref >59)
Glucose: 94 mg/dL (ref 65–99)
Potassium: 5.4 mmol/L — ABNORMAL HIGH (ref 3.5–5.2)
Sodium: 142 mmol/L (ref 134–144)

## 2018-07-19 NOTE — Telephone Encounter (Signed)
Patient informed of lab results and informed to stop potassium. Patient verbally understands.

## 2018-08-16 ENCOUNTER — Encounter: Payer: Self-pay | Admitting: Cardiology

## 2018-08-16 ENCOUNTER — Telehealth (INDEPENDENT_AMBULATORY_CARE_PROVIDER_SITE_OTHER): Payer: Medicare Other | Admitting: Cardiology

## 2018-08-16 ENCOUNTER — Other Ambulatory Visit: Payer: Self-pay

## 2018-08-16 VITALS — BP 109/70 | HR 57 | Wt 257.0 lb

## 2018-08-16 DIAGNOSIS — R0600 Dyspnea, unspecified: Secondary | ICD-10-CM

## 2018-08-16 DIAGNOSIS — G4733 Obstructive sleep apnea (adult) (pediatric): Secondary | ICD-10-CM

## 2018-08-16 DIAGNOSIS — I10 Essential (primary) hypertension: Secondary | ICD-10-CM

## 2018-08-16 DIAGNOSIS — I2510 Atherosclerotic heart disease of native coronary artery without angina pectoris: Secondary | ICD-10-CM | POA: Diagnosis not present

## 2018-08-16 DIAGNOSIS — I2541 Coronary artery aneurysm: Secondary | ICD-10-CM

## 2018-08-16 NOTE — Addendum Note (Signed)
Addended by: Ashok Norris on: 08/16/2018 09:18 AM   Modules accepted: Orders

## 2018-08-16 NOTE — Patient Instructions (Signed)
Medication Instructions:  Your physician recommends that you continue on your current medications as directed. Please refer to the Current Medication list given to you today.  If you need a refill on your cardiac medications before your next appointment, please call your pharmacy.   Lab work: Your physician recommends that you return for lab work in 1 week: bmp ,bnp  If you have labs (blood work) drawn today and your tests are completely normal, you will receive your results only by: Marland Kitchen MyChart Message (if you have MyChart) OR . A paper copy in the mail If you have any lab test that is abnormal or we need to change your treatment, we will call you to review the results.  Testing/Procedures: None.   Follow-Up: At [facility], you and your health needs are our priority.  As part of our continuing mission to provide you with exceptional heart care, we have created designated Provider Care Teams.  These Care Teams include your primary Cardiologist (physician) and Advanced Practice Providers (APPs -  Physician Assistants and Nurse Practitioners) who all work together to provide you with the care you need, when you need it. You will need a follow up appointment in 3 months.  Please call our office 2 months in advance to schedule this appointment.  You may see No primary care provider on file. or another member of our [employer] Provider Team in [place]: Shirlee More, MD . Jyl Heinz, MD  Any Other Special Instructions Will Be Listed Below (If Applicable).

## 2018-08-16 NOTE — Progress Notes (Signed)
Virtual Visit via Video Note   This visit type was conducted due to national recommendations for restrictions regarding the COVID-19 Pandemic (e.g. social distancing) in an effort to limit this patient's exposure and mitigate transmission in our community.  Due to his co-morbid illnesses, this patient is at least at moderate risk for complications without adequate follow up.  This format is felt to be most appropriate for this patient at this time.  All issues noted in this document were discussed and addressed.  A limited physical exam was performed with this format.  Please refer to the patient's chart for his consent to telehealth for [facility].  Evaluation Performed:  Follow-up visit  This visit type was conducted due to national recommendations for restrictions regarding the COVID-19 Pandemic (e.g. social distancing).  This format is felt to be most appropriate for this patient at this time.  All issues noted in this document were discussed and addressed.  No physical exam was performed (except for noted visual exam findings with Video Visits).  Please refer to the patient's chart (MyChart message for video visits and phone note for telephone visits) for the patient's consent to telehealth for [facility].  Date:  08/16/2018  ID: [name], DOB 22-Jan-1951, MRN [id number]   Patient Location: [place] [address]   Provider location:   [place] Office  PCP:  Renaldo Reel, PA  Cardiologist:  Jenne Campus, MD     Chief Complaint: Doing better  History of Present Illness:    Phillip Dunn is a 68 y.o. male  who presents via audio/video conferencing for a telehealth visit today.  Past medical history significant for coronary artery disease, arteries of the circumflex artery which is stented, diabetes, hypertension talked to him over the video link.  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  He does have a lot of orders on his feet being very busy.  Denies having any chest pain tightness squeezing pressure burning chest.  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 heart failure) (HCC)    Bradycardia    COPD (chronic obstructive pulmonary disease) (HCC)    Coronary artery disease    Diabetes mellitus without complication (HCC)    Hepatitis    Hyperlipidemia    Myocardial infarct (HCC)    OSA (obstructive sleep apnea)    Peptic ulcer    Primary insomnia    Secondary hypotension    Venous insufficiency of left lower extremity     Past Surgical History:  Procedure Laterality Date   CATARACT EXTRACTION, BILATERAL     CORONARY ARTERY BYPASS GRAFT       Current Meds  Medication Sig   albuterol (VENTOLIN HFA) 108 (90 Base) MCG/ACT inhaler Inhale 2 puffs into the lungs every 4 (four) hours as needed for wheezing.   aspirin EC  81 MG tablet Take 81 mg by mouth daily.   atorvastatin (LIPITOR) 80 MG tablet TAKE 1 TABLET EVERY DAY   b complex vitamins tablet Take 1 tablet by mouth daily.   budesonide-formoterol (SYMBICORT) 160-4.5 MCG/ACT inhaler Inhale 2 puffs into the lungs 2 (two) times daily.   clopidogrel (PLAVIX) 75 MG tablet TAKE 1 TABLET EVERY DAY   Coenzyme Q10 (COQ-10) 10 MG CAPS Take 10 mg by mouth daily.   DOCOSAHEXAENOIC ACID PO Take 1 capsule by mouth daily.   finasteride (PROSCAR) 5 MG tablet TAKE 1 TABLET EVERY DAY   FLUoxetine  (PROZAC) 20 MG tablet Take 20 mg by mouth daily.   furosemide (LASIX) 40 MG tablet TAKE 3 TABLETS EVERY DAY   ipratropium (ATROVENT) 0.03 % nasal spray Place 1 spray into both nostrils every 12 (twelve) hours.   lisinopril (PRINIVIL,ZESTRIL) 2.5 MG tablet TAKE 1 TABLET EVERY DAY   Melatonin 5 MG TABS Take 1 tablet by mouth as needed.   metoprolol succinate (TOPROL-XL) 25 MG 24 hr tablet Take 12.5 mg by mouth daily.   nitroGLYCERIN (NITROSTAT) 0.4 MG SL tablet Place 0.4 mg under the tongue as needed for chest pain.   Probiotic Product (PROBIOTIC ADVANCED PO) Take by mouth.   tamsulosin (FLOMAX) 0.4 MG CAPS capsule Take 0.4 mg by mouth daily.   vitamin C (ASCORBIC ACID) 500 MG tablet Take 500 mg by mouth daily.   Vitamin D, Ergocalciferol, (DRISDOL) 50000 units CAPS capsule Take 1 capsule by mouth once a week.      Family History: The patient's family history includes Stroke in his mother.   ROS:   Please see the history of present illness.     All other systems reviewed and are negative.   Labs/Other Tests and Data Reviewed:     Recent Labs: 02/20/2018: Hemoglobin 13.5; Platelets 172 03/29/2018: ALT 38 06/13/2018: NT-Pro BNP 451 07/19/2018: BUN 14; Creatinine, Ser 0.87; Potassium 5.4; Sodium 142  Recent Lipid Panel    Component Value Date/Time   CHOL 123 03/29/2018 0933   TRIG 47 03/29/2018 0933   HDL 57 03/29/2018 0933   CHOLHDL 2.2 03/29/2018 0933   LDLCALC 57 03/29/2018 0933      Exam:    Vital Signs:  BP 109/70    Pulse (!) 57    Wt 257 lb (116.6 kg)    SpO2 93%    BMI 40.25 kg/m     Wt Readings from Last 3 Encounters:  08/16/18 257 lb (116.6 kg)  07/05/18 255 lb (115.7 kg)  06/03/18 264 lb 3.2 oz (119.8 kg)     Well nourished, well developed in no acute distress. Alert awake oriented x3 not in any distress happy to be able to talk to me.  Diagnosis for this visit:   1. Coronary artery disease involving native coronary artery of native heart  without angina pectoris   2. Essential hypertension   3. Coronary artery aneurysm   4. Obstructive sleep apnea syndrome      ASSESSMENT & PLAN:    1.  Coronary artery disease doing well from that point of view.  Asymptomatic.  We will continue present management. 2.  Essential hypertension blood pressure well controlled continue present management.  I will ask him to have Chem-7 done to recheck on his kidney function potassium last time when we did his potassium was elevated I asked him to stop it.  We will check it. 3.  Obstructive sleep apnea followed by internal medicine team. 4.  Diabetes mellitus apparently stable. 5.  Dyslipidemia on statin with last cholesterol profile being good.  COVID-19 Education: The signs and symptoms of COVID-19 were discussed with the patient and how to seek care for testing (follow up with PCP or arrange E-visit).  The importance of social distancing was discussed today.  Patient Risk:   After full review of this patients clinical status, I feel that they are at least moderate risk at this time.  Time:   Today, I have spent 18 minutes with the patient with telehealth technology discussing pt health issues.  I spent 5 minutes reviewing her chart before the visit.  Visit was finished at 9:04 AM.    Medication Adjustments/Labs and Tests Ordered: Current medicines are reviewed at length with the patient today.  Concerns regarding medicines are outlined above.  No orders of the defined types were placed in this encounter.  Medication changes: No orders of the defined types were placed in this encounter.    Disposition: Follow-up 3 months will do Chem-7.  Signed, Park Liter, MD, [facility] 08/16/2018 9:03 AM    [place]

## 2018-08-21 DIAGNOSIS — R0600 Dyspnea, unspecified: Secondary | ICD-10-CM | POA: Diagnosis not present

## 2018-08-21 DIAGNOSIS — I2510 Atherosclerotic heart disease of native coronary artery without angina pectoris: Secondary | ICD-10-CM | POA: Diagnosis not present

## 2018-08-21 DIAGNOSIS — I10 Essential (primary) hypertension: Secondary | ICD-10-CM | POA: Diagnosis not present

## 2018-08-21 LAB — BASIC METABOLIC PANEL
BUN/Creatinine Ratio: 15 (ref 10–24)
BUN: 14 mg/dL (ref 8–27)
CO2: 26 mmol/L (ref 20–29)
Calcium: 9.8 mg/dL (ref 8.6–10.2)
Chloride: 100 mmol/L (ref 96–106)
Creatinine, Ser: 0.91 mg/dL (ref 0.76–1.27)
GFR calc Af Amer: 100 mL/min/{1.73_m2} (ref >59)
GFR calc non Af Amer: 86 mL/min/{1.73_m2} (ref >59)
Glucose: 112 mg/dL — ABNORMAL HIGH (ref 65–99)
Potassium: 4.9 mmol/L (ref 3.5–5.2)
Sodium: 140 mmol/L (ref 134–144)

## 2018-08-21 LAB — PRO B NATRIURETIC PEPTIDE: NT-Pro BNP: 653 pg/mL — ABNORMAL HIGH (ref 0–376)

## 2018-09-07 ENCOUNTER — Other Ambulatory Visit: Payer: Self-pay | Admitting: Cardiology

## 2018-09-26 DIAGNOSIS — E039 Hypothyroidism, unspecified: Secondary | ICD-10-CM | POA: Diagnosis not present

## 2018-09-26 DIAGNOSIS — Z79899 Other long term (current) drug therapy: Secondary | ICD-10-CM | POA: Diagnosis not present

## 2018-09-26 DIAGNOSIS — E785 Hyperlipidemia, unspecified: Secondary | ICD-10-CM | POA: Diagnosis not present

## 2018-09-26 DIAGNOSIS — R739 Hyperglycemia, unspecified: Secondary | ICD-10-CM | POA: Diagnosis not present

## 2018-09-26 DIAGNOSIS — N401 Enlarged prostate with lower urinary tract symptoms: Secondary | ICD-10-CM | POA: Diagnosis not present

## 2018-09-26 DIAGNOSIS — I10 Essential (primary) hypertension: Secondary | ICD-10-CM | POA: Diagnosis not present

## 2018-09-26 DIAGNOSIS — E559 Vitamin D deficiency, unspecified: Secondary | ICD-10-CM | POA: Diagnosis not present

## 2018-10-08 DIAGNOSIS — J301 Allergic rhinitis due to pollen: Secondary | ICD-10-CM | POA: Diagnosis not present

## 2018-10-08 DIAGNOSIS — G4733 Obstructive sleep apnea (adult) (pediatric): Secondary | ICD-10-CM | POA: Diagnosis not present

## 2018-10-08 DIAGNOSIS — J4540 Moderate persistent asthma, uncomplicated: Secondary | ICD-10-CM | POA: Diagnosis not present

## 2018-10-08 DIAGNOSIS — I509 Heart failure, unspecified: Secondary | ICD-10-CM | POA: Diagnosis not present

## 2018-10-08 DIAGNOSIS — R0982 Postnasal drip: Secondary | ICD-10-CM | POA: Diagnosis not present

## 2018-11-03 ENCOUNTER — Other Ambulatory Visit: Payer: Self-pay | Admitting: Cardiology

## 2018-11-21 ENCOUNTER — Encounter: Payer: Self-pay | Admitting: Cardiology

## 2018-11-21 ENCOUNTER — Telehealth (INDEPENDENT_AMBULATORY_CARE_PROVIDER_SITE_OTHER): Payer: Medicare Other | Admitting: Cardiology

## 2018-11-21 ENCOUNTER — Ambulatory Visit (INDEPENDENT_AMBULATORY_CARE_PROVIDER_SITE_OTHER): Payer: Medicare Other | Admitting: Cardiology

## 2018-11-21 ENCOUNTER — Telehealth: Payer: Self-pay | Admitting: Cardiology

## 2018-11-21 ENCOUNTER — Other Ambulatory Visit: Payer: Self-pay

## 2018-11-21 VITALS — BP 112/76 | HR 60 | Ht 67.0 in | Wt 268.0 lb

## 2018-11-21 VITALS — BP 107/62 | HR 39 | Wt 267.0 lb

## 2018-11-21 DIAGNOSIS — I2510 Atherosclerotic heart disease of native coronary artery without angina pectoris: Secondary | ICD-10-CM | POA: Diagnosis not present

## 2018-11-21 DIAGNOSIS — I493 Ventricular premature depolarization: Secondary | ICD-10-CM | POA: Diagnosis not present

## 2018-11-21 DIAGNOSIS — I10 Essential (primary) hypertension: Secondary | ICD-10-CM

## 2018-11-21 DIAGNOSIS — R008 Other abnormalities of heart beat: Secondary | ICD-10-CM

## 2018-11-21 DIAGNOSIS — R001 Bradycardia, unspecified: Secondary | ICD-10-CM | POA: Diagnosis not present

## 2018-11-21 DIAGNOSIS — I2541 Coronary artery aneurysm: Secondary | ICD-10-CM

## 2018-11-21 DIAGNOSIS — E785 Hyperlipidemia, unspecified: Secondary | ICD-10-CM

## 2018-11-21 NOTE — Addendum Note (Signed)
Addended by: Polly Cobia A on: 11/21/2018 08:43 AM   Modules accepted: Orders

## 2018-11-21 NOTE — Patient Instructions (Signed)
Medication Instructions:  Your physician has recommended you make the following change in your medication: STOP METOPROLOL  If you need a refill on your cardiac medications before your next appointment, please call your pharmacy.   Lab work: NONE  If you have labs (blood work) drawn today and your tests are completely normal, you will receive your results only by: Marland Kitchen MyChart Message (if you have MyChart) OR . A paper copy in the mail If you have any lab test that is abnormal or we need to change your treatment, we will call you to review the results.  Testing/Procedures: Your physician has recommended that you wear a holter monitor. Holter monitors are medical devices that record the heart's electrical activity. Doctors most often use these monitors to diagnose arrhythmias. Arrhythmias are problems with the speed or rhythm of the heartbeat. The monitor is a small, portable device. You can wear one while you do your normal daily activities. This is usually used to diagnose what is causing palpitations/syncope (passing out).  WEAR FOR 7 DAYS  Follow-Up: At [facility], you and your health needs are our priority.  As part of our continuing mission to provide you with exceptional heart care, we have created designated Provider Care Teams.  These Care Teams include your primary Cardiologist (physician) and Advanced Practice Providers (APPs -  Physician Assistants and Nurse Practitioners) who all work together to provide you with the care you need, when you need it. You will need a follow up appointment in 2 months.  Please call our office 2 months in advance to schedule this appointment.  You may see No primary care provider on file. or another member of our [employer] Provider Team in [place]: Shirlee More, MD . Jyl Heinz, MD  Any Other Special Instructions Will Be Listed Below (If Applicable).

## 2018-11-21 NOTE — Telephone Encounter (Signed)
Virtual Visit Pre-Appointment Phone Call  "(Name), I am calling you today to discuss your upcoming appointment. We are currently trying to limit exposure to the virus that causes COVID-19 by seeing patients at home rather than in the office."  1. "What is the BEST phone number to call the day of the visit?" - include this in appointment notes  2. Do you have or have access to (through a family member/friend) a smartphone with video capability that we can use for your visit?" a. If yes - list this number in appt notes as cell (if different from BEST phone #) and list the appointment type as a VIDEO visit in appointment notes b. If no - list the appointment type as a PHONE visit in appointment notes  3. Confirm consent - "In the setting of the current Covid19 crisis, you are scheduled for a (phone or video) visit with your provider on (date) at (time).  Just as we do with many in-office visits, in order for you to participate in this visit, we must obtain consent.  If you'd like, I can send this to your mychart (if signed up) or email for you to review.  Otherwise, I can obtain your verbal consent now.  All virtual visits are billed to your insurance company just like a normal visit would be.  By agreeing to a virtual visit, we'd like you to understand that the technology does not allow for your provider to perform an examination, and thus may limit your provider's ability to fully assess your condition. If your provider identifies any concerns that need to be evaluated in person, we will make arrangements to do so.  Finally, though the technology is pretty good, we cannot assure that it will always work on either your or our end, and in the setting of a video visit, we may have to convert it to a phone-only visit.  In either situation, we cannot ensure that we have a secure connection.  Are you willing to proceed?" STAFF: Did the patient verbally acknowledge consent to telehealth visit? Document  YES/NO here: yes  4. Advise patient to be prepared - "Two hours prior to your appointment, go ahead and check your blood pressure, pulse, oxygen saturation, and your weight (if you have the equipment to check those) and write them all down. When your visit starts, your provider will ask you for this information. If you have an Apple Watch or Kardia device, please plan to have heart rate information ready on the day of your appointment. Please have a pen and paper handy nearby the day of the visit as well."  5. Give patient instructions for MyChart download to smartphone OR [email or web address] as below if video visit (depending on what platform provider is using)  6. Inform patient they will receive a phone call 15 minutes prior to their appointment time (may be from unknown caller ID) so they should be prepared to answer    TELEPHONE CALL NOTE  Hallie Ishida has been deemed a candidate for a follow-up tele-health visit to limit community exposure during the Covid-19 pandemic. I spoke with the patient via phone to ensure availability of phone/video source, confirm preferred email & phone number, and discuss instructions and expectations.  I reminded Phillip Dunn to be prepared with any vital sign and/or heart rhythm information that could potentially be obtained via home monitoring, at the time of his visit. I reminded Phillip Dunn to expect a phone call prior to his visit.  Calla Kicks 11/21/2018 8:06 AM   INSTRUCTIONS FOR DOWNLOADING THE MYCHART APP TO SMARTPHONE  - The patient must first make sure to have activated MyChart and know their login information - If Apple, go to [employer] and type in MyChart in the search bar and download the app. If Android, ask patient to go to [employer] and type in [place] in the search bar and download the app. The app is free but as with any other app downloads, their phone may require them to verify saved payment information or  Apple/Android password.  - The patient will need to then log into the app with their MyChart username and password, and select [facility] as their healthcare provider to link the account. When it is time for your visit, go to the MyChart app, find appointments, and click Begin Video Visit. Be sure to Select Allow for your device to access the Microphone and Camera for your visit. You will then be connected, and your provider will be with you shortly.  **If they have any issues connecting, or need assistance please contact MyChart service desk [phone])**  **If using a computer, in order to ensure the best quality for their visit they will need to use either of the following Internet Browsers: [employer], or Google Chrome**  IF USING DOXIMITY or DOXY.ME - The patient will receive a link just prior to their visit by text.     FULL LENGTH CONSENT FOR TELE-HEALTH VISIT   I hereby voluntarily request, consent and authorize [facility] and its employed or contracted physicians, physician assistants, nurse practitioners or other licensed health care professionals (the Practitioner), to provide me with telemedicine health care services (the Services") as deemed necessary by the treating Practitioner. I acknowledge and consent to receive the Services by the Practitioner via telemedicine. I understand that the telemedicine visit will involve communicating with the Practitioner through live audiovisual communication technology and the disclosure of certain medical information by electronic transmission. I acknowledge that I have been given the opportunity to request an in-person assessment or other available alternative prior to the telemedicine visit and am voluntarily participating in the telemedicine visit.  I understand that I have the right to withhold or withdraw my consent to the use of telemedicine in the course of my care at any time, without affecting my right to future care  or treatment, and that the Practitioner or I may terminate the telemedicine visit at any time. I understand that I have the right to inspect all information obtained and/or recorded in the course of the telemedicine visit and may receive copies of available information for a reasonable fee.  I understand that some of the potential risks of receiving the Services via telemedicine include:   Delay or interruption in medical evaluation due to technological equipment failure or disruption;  Information transmitted may not be sufficient (e.g. poor resolution of images) to allow for appropriate medical decision making by the Practitioner; and/or   In rare instances, security protocols could fail, causing a breach of personal health information.  Furthermore, I acknowledge that it is my responsibility to provide information about my medical history, conditions and care that is complete and accurate to the best of my ability. I acknowledge that Practitioner's advice, recommendations, and/or decision may be based on factors not within their control, such as incomplete or inaccurate data provided by me or distortions of diagnostic images or specimens that may result from electronic transmissions. I understand that the  practice of medicine is not an Chief Strategy Officer and that Practitioner makes no warranties or guarantees regarding treatment outcomes. I acknowledge that I will receive a copy of this consent concurrently upon execution via email to the email address I last provided but may also request a printed copy by calling the office of [facility].    I understand that my insurance will be billed for this visit.   I have read or had this consent read to me.  I understand the contents of this consent, which adequately explains the benefits and risks of the Services being provided via telemedicine.   I have been provided ample opportunity to ask questions regarding this consent and the Services and have had  my questions answered to my satisfaction.  I give my informed consent for the services to be provided through the use of telemedicine in my medical care  By participating in this telemedicine visit I agree to the above.

## 2018-11-21 NOTE — Progress Notes (Signed)
Virtual Visit via Video Note   This visit type was conducted due to national recommendations for restrictions regarding the COVID-19 Pandemic (e.g. social distancing) in an effort to limit this patient's exposure and mitigate transmission in our community.  Due to his co-morbid illnesses, this patient is at least at moderate risk for complications without adequate follow up.  This format is felt to be most appropriate for this patient at this time.  All issues noted in this document were discussed and addressed.  A limited physical exam was performed with this format.  Please refer to the patient's chart for his consent to telehealth for [facility].  Evaluation Performed:  Follow-up visit  This visit type was conducted due to national recommendations for restrictions regarding the COVID-19 Pandemic (e.g. social distancing).  This format is felt to be most appropriate for this patient at this time.  All issues noted in this document were discussed and addressed.  No physical exam was performed (except for noted visual exam findings with Video Visits).  Please refer to the patient's chart (MyChart message for video visits and phone note for telephone visits) for the patient's consent to telehealth for [facility].  Date:  11/21/2018  ID: Phillip Dunn, DOB 1951-01-05, MRN [id number]   Patient Location[place] [address]   Provider location:   [place] Office  PCP:  Renaldo Reel, PA  Cardiologist:  Jenne Campus, MD     Chief Complaint: I am awake  History of Present Illness:    Phillip Dunn is a 68 y.o. male  who presents via audio/video conferencing for a telehealth visit today.  With coronary artery disease also coronary artery aneurysm, hypertension, diabetes, dyslipidemia.  He does have regular visits with me today.  We use video link.  He complained of being weak and tired recheck his heart rate today is 39 he check it with  pulse oximetry as well as with blood pressure monitor.  Denies having any dizziness or passing out.  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  Overall does not have much cardiac complaints.   The patient does not have symptoms concerning for COVID-19 infection (fever, chills, cough, or new SHORTNESS OF BREATH).    Prior CV studies:   The following studies were reviewed today:       Past Medical History:  Diagnosis Date  . 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 Asthma   . Benign hypertensive heart disease with CHF (congestive heart failure) ([place])   . Bradycardia   . COPD (chronic obstructive pulmonary disease) ([place])   . Coronary artery disease   . Diabetes mellitus without complication ([place])   . Hepatitis   . Hyperlipidemia   . Myocardial infarct ([place])   . OSA (obstructive sleep apnea)   . Peptic ulcer   . Primary insomnia   . Secondary hypotension   . Venous insufficiency of left lower extremity     Past Surgical History:  Procedure Laterality Date  . CATARACT EXTRACTION, BILATERAL    . CORONARY ARTERY BYPASS GRAFT       Current Meds  Medication Sig  . albuterol (VENTOLIN HFA) 108 (90 Base) MCG/ACT inhaler Inhale 2 puffs into the lungs every 4 (four) hours as needed for wheezing.  Marland Kitchen aspirin EC 81 MG tablet Take 81 mg by mouth daily.  Marland Kitchen atorvastatin (LIPITOR) 80 MG tablet TAKE 1 TABLET EVERY DAY  . budesonide-formoterol (SYMBICORT) 160-4.5 MCG/ACT inhaler Inhale 2 puffs into the lungs  2 (two) times daily.  . clopidogrel (PLAVIX) 75 MG tablet TAKE 1 TABLET EVERY DAY  . Coenzyme Q10 (COQ-10) 10 MG CAPS Take 10 mg by mouth daily.  . DOCOSAHEXAENOIC ACID PO Take 1 capsule by mouth daily.  . finasteride (PROSCAR) 5 MG tablet TAKE 1 TABLET EVERY DAY  . furosemide (LASIX) 40 MG tablet TAKE 3 TABLETS EVERY DAY  . ipratropium (ATROVENT) 0.03 % nasal spray Place 1 spray into both nostrils every 12 (twelve) hours.  Marland Kitchen lisinopril (PRINIVIL,ZESTRIL) 2.5 MG tablet TAKE 1 TABLET EVERY DAY  .  Melatonin 5 MG TABS Take 1 tablet by mouth as needed.  . metoprolol succinate (TOPROL-XL) 25 MG 24 hr tablet Take 12.5 mg by mouth daily.  . nitroGLYCERIN (NITROSTAT) 0.4 MG SL tablet Place 0.4 mg under the tongue as needed for chest pain.  . Probiotic Product (PROBIOTIC ADVANCED PO) Take by mouth.  . tamsulosin (FLOMAX) 0.4 MG CAPS capsule Take 0.4 mg by mouth daily.  . Vitamin D, Ergocalciferol, (DRISDOL) 50000 units CAPS capsule Take 1 capsule by mouth once a week.      Family History: The patient's family history includes Stroke in his mother.   ROS:   Please see the history of present illness.     All other systems reviewed and are negative.   Labs/Other Tests and Data Reviewed:     Recent Labs: 02/20/2018: Hemoglobin 13.5; Platelets 172 03/29/2018: ALT 38 08/21/2018: BUN 14; Creatinine, Ser 0.91; NT-Pro BNP 653; Potassium 4.9; Sodium 140  Recent Lipid Panel    Component Value Date/Time   CHOL 123 03/29/2018 0933   TRIG 47 03/29/2018 0933   HDL 57 03/29/2018 0933   CHOLHDL 2.2 03/29/2018 0933   LDLCALC 57 03/29/2018 0933      Exam:    Vital Signs:  BP 107/62   Pulse (!) 39   Wt 267 lb (121.1 kg)   SpO2 98%   BMI 41.82 kg/m     Wt Readings from Last 3 Encounters:  11/21/18 267 lb (121.1 kg)  08/16/18 257 lb (116.6 kg)  07/05/18 255 lb (115.7 kg)     Well nourished, well developed in no acute distress. Alert awake and then 3 quite cheerful being able to talk to me over the phone.  He sitting in his living room at home I am in our office in [place].  Not in any distress  Diagnosis for this visit:   1. Coronary artery disease involving native coronary artery of native heart without angina pectoris   2. Essential hypertension   3. Coronary artery aneurysm   4. Dyslipidemia      ASSESSMENT & PLAN:    1.  Bradycardia concerning asking to stop metoprolol.  I will invite him to our office in [place] today to do EKG.  In about a week we will put event  recorder for about a week.  I told him he must let me know if he will have dizzy spell or passing out 2.  Essential hypertension well-controlled continue present management. 4.  Coronary arteries.  We will schedule him to have a CT Angie of his chest.  It he did have low fractional flow reserve however for some reason was not done. 5.  Dyslipidemia will contact primary care physician to get fasting lipid profile.  Last cholesterol to have is from June which showed LDL of 52 and HDL 41.  This is acceptable results.  COVID-19 Education: The signs and symptoms of COVID-19 were discussed with  the patient and how to seek care for testing (follow up with PCP or arrange E-visit).  The importance of social distancing was discussed today.  Patient Risk:   After full review of this patients clinical status, I feel that they are at least moderate risk at this time.  Time:   Today, I have spent 15 minutes with the patient with telehealth technology discussing pt health issues.  I spent 5 minutes reviewing her chart before the visit.  Visit was finished at 821.    Medication Adjustments/Labs and Tests Ordered: Current medicines are reviewed at length with the patient today.  Concerns regarding medicines are outlined above.  No orders of the defined types were placed in this encounter.  Medication changes: No orders of the defined types were placed in this encounter.    Disposition: Follow-up 6 weeks  Signed, Park Liter, MD, [facility] 11/21/2018 8:20 AM    [place]

## 2018-11-21 NOTE — Patient Instructions (Signed)
Medication Instructions:  Your physician has recommended you make the following change in your medication:   STOP  Metoprolol  If you need a refill on your cardiac medications before your next appointment, please call your pharmacy.   Lab work: None today.  If you have labs (blood work) drawn today and your tests are completely normal, you will receive your results only by: Marland Kitchen MyChart Message (if you have MyChart) OR . A paper copy in the mail If you have any lab test that is abnormal or we need to change your treatment, we will call you to review the results.  Testing/Procedures:  A ZIO telemetry monitor has been ordered for you today. Expect this to arrive in the mail in the next couple days.  Follow-Up: At [facility], you and your health needs are our priority.  As part of our continuing mission to provide you with exceptional heart care, we have created designated Provider Care Teams.  These Care Teams include your primary Cardiologist (physician) and Advanced Practice Providers (APPs -  Physician Assistants and Nurse Practitioners) who all work together to provide you with the care you need, when you need it. You will need a follow up appointment in 2 months.    Any Other Special Instructions Will Be Listed Below (If Applicable).   Premature Ventricular Contraction  A premature ventricular contraction (PVC) is a common kind of irregular heartbeat (arrhythmia). These contractions are extra heartbeats that start in the ventricles of the heart and occur too early in the normal sequence. During the PVC, the heart's normal electrical pathway is not used, so the beat is shorter and less effective. In most cases, these contractions come and go and do not require treatment. What are the causes? Common causes of the condition include:  Smoking.  Drinking alcohol.  Certain medicines.  Some illegal drugs.  Stress.  Caffeine. Certain medical conditions can also cause PVCs:   Heart failure.  Heart attack, or coronary artery disease.  Heart valve problems.  Changes in minerals in the blood (electrolytes).  Low blood oxygen levels or high carbon dioxide levels. In many cases, the cause of this condition is not known. What are the signs or symptoms? The main symptom of this condition is fast or skipped heartbeats (palpitations). Other symptoms include:  Chest pain.  Shortness of breath.  Feeling tired.  Dizziness.  Difficulty exercising. In some cases, there are no symptoms. How is this diagnosed? This condition may be diagnosed based on:  Your medical history.  A physical exam. During the exam, the health care provider will check for irregular heartbeats.  Tests, such as: ? An ECG (electrocardiogram) to monitor the electrical activity of your heart. ? An ambulatory cardiac monitor. This device records your heartbeats for 24 hours or more. ? Stress tests to see how exercise affects your heart rhythm and blood supply. ? An echocardiogram. This test uses sound waves (ultrasound) to produce an image of your heart. ? An electrophysiology study (EPS). This test checks for electrical problems in your heart. How is this treated? Treatment for this condition depends on any underlying conditions, the type of PVCs that you are having, and how much the symptoms are interfering with your daily life. Possible treatments include:  Avoiding things that cause premature contractions (triggers). These include caffeine and alcohol.  Taking medicines if symptoms are severe or if the extra heartbeats are frequent.  Getting treatment for underlying conditions that cause PVCs.  Having an implantable cardioverter defibrillator (ICD),  if you are at risk for a serious arrhythmia. The ICD is a small device that is inserted into your chest to monitor your heartbeat. When it senses an irregular heartbeat, it sends a shock to bring the heartbeat back to normal.  Having a  procedure to destroy the portion of the heart tissue that sends out abnormal signals (catheter ablation). In some cases, no treatment is required. Follow these instructions at home: Lifestyle  Do not use any products that contain nicotine or tobacco, such as cigarettes, e-cigarettes, and chewing tobacco. If you need help quitting, ask your health care provider.  Do not use illegal drugs.  Exercise regularly. Ask your health care provider what type of exercise is safe for you.  Try to get at least 7-9 hours of sleep each night, or as much as recommended by your health care provider.  Find healthy ways to manage stress. Avoid stressful situations when possible. Alcohol use  Do not drink alcohol if: ? Your health care provider tells you not to drink. ? You are pregnant, may be pregnant, or are planning to become pregnant. ? Alcohol triggers your episodes.  If you drink alcohol: ? Limit how much you use to:  0-1 drink a day for women.  0-2 drinks a day for men.  Be aware of how much alcohol is in your drink. In the [place] one drink equals one 12 oz bottle of beer (355 mL), one 5 oz glass of wine (148 mL), or one 1 oz glass of hard liquor (44 mL). General instructions  Take over-the-counter and prescription medicines only as told by your health care provider.  If caffeine triggers episodes of PVC, do not eat, drink, or use anything with caffeine in it.  Keep all follow-up visits as told by your health care provider. This is important. Contact a health care provider if you:  Feel palpitations. Get help right away if you:  Have chest pain.  Have shortness of breath.  Have sweating for no reason.  Have nausea and vomiting.  Become light-headed or you faint. Summary  A premature ventricular contraction (PVC) is a common kind of irregular heartbeat (arrhythmia).  In most cases, these contractions come and go and do not require treatment.  You may need to wear an  ambulatory cardiac monitor. This records your heartbeats for 24 hours or more.  Treatment depends on any underlying conditions, the type of PVCs that you are having, and how much the symptoms are interfering with your daily life. This information is not intended to replace advice given to you by your health care provider. Make sure you discuss any questions you have with your health care provider. Document Released: 11/05/2003 Document Revised: 12/13/2017 Document Reviewed: 12/13/2017 Elsevier Patient Education  2020 [employer].

## 2018-11-21 NOTE — Progress Notes (Signed)
Cardiology Office Note:    Date:  11/21/2018   ID:  Phillip Dunn, DOB 12-04-1950, MRN [id number]  PCP:  Renaldo Reel, PA  Cardiologist:  Dr. Agustin Cree  ASSESSMENT:    1. Trigeminy   2. Bradycardia   3. Frequent PVCs    PLAN:    In order of problems listed above:  1. Trigeminy/Frequent PVCs - Noted on EKG today. Education provided on PVCs. Anticipate this is the source of his light headedness. Metoprolol discontinued by Dr. Raliegh Ip this morning. He verbalizes understanding that a 7 day Zio monitor will be mailed to his home. He is agreeable to keep a record of any symptoms. Educated him that his BP cuff will likely continue to give low heart rate readings due to the irregularity of PVCs. 2. CAD - Stable. No anginal symptoms. No indication for ischemic evaluation at this time.  3. HTN - BP well controlled today on exam.  4. LE edema, bilateral - Chronic per patient report. Tells me he elevates his LE. Recommended compression stockings or ACE bandages for increased compression.    Next appointment: 2 month with Dr. Raliegh Ip as previously scheduled   Medication Adjustments/Labs and Tests Ordered: Current medicines are reviewed at length with the patient today.  Concerns regarding medicines are outlined above.  Orders Placed This Encounter  Procedures   EKG 12-Lead   No orders of the defined types were placed in this encounter.  Chief Complaint  Patient presents with   Follow-up   Bradycardia    History of Present Illness:    Phillip Dunn is a 68 y.o. male with a hx of CAD, coronary artery aneurysm, HTN, DM2, DLD last seen by Dr. Agustin Cree via telemedicine this morning. Reported weakness, fatigue - no dizziness nor syncope - HR on his pulse oximetry and BP monitor was 39. Presents today in the office for EKG for evaluation of bradycardia.   He denies chest pain, shortness of breath. Reports his chronic lower extremity weakness is stable at his baseline. Tells me he feels  lightheaded with positional changes such as sitting to standing.  Denies use of pro-arrthymic medications. Tells me he does not monitor his sodium intake.   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[place])    Asthma    Benign hypertensive heart disease with CHF (congestive heart failure) ([facility])    Bradycardia    COPD (chronic obstructive pulmonary disease) ([facility])    Coronary artery disease    Diabetes mellitus without complication ([facility])    Hepatitis    Hyperlipidemia    Myocardial infarct ([facility])    OSA (obstructive sleep apnea)    Peptic ulcer    Primary insomnia    Secondary hypotension    Venous insufficiency of left lower extremity     Past Surgical History:  Procedure Laterality Date   CARDIAC CATHETERIZATION     CARPAL TUNNEL RELEASE Left    CATARACT EXTRACTION, BILATERAL     CORONARY ANGIOPLASTY     CORONARY ARTERY BYPASS GRAFT      Current Medications: Current Meds  Medication Sig   albuterol (VENTOLIN HFA) 108 (90 Base) MCG/ACT inhaler Inhale 2 puffs into the lungs every 4 (four) hours as needed for wheezing.   aspirin EC 81 MG tablet Take 81 mg by mouth daily.   atorvastatin (LIPITOR) 80 MG tablet TAKE 1 TABLET EVERY DAY   budesonide-formoterol (SYMBICORT) 160-4.5 MCG/ACT inhaler Inhale 2 puffs into the lungs 2 (two) times daily.  clopidogrel (PLAVIX) 75 MG tablet TAKE 1 TABLET EVERY DAY   Coenzyme Q10 (COQ-10) 10 MG CAPS Take 10 mg by mouth daily.   DOCOSAHEXAENOIC ACID PO Take 1 capsule by mouth daily.   finasteride (PROSCAR) 5 MG tablet TAKE 1 TABLET EVERY DAY   furosemide (LASIX) 40 MG tablet TAKE 3 TABLETS EVERY DAY   ipratropium (ATROVENT) 0.03 % nasal spray Place 1 spray into both nostrils every 12 (twelve) hours.   lisinopril (PRINIVIL,ZESTRIL) 2.5 MG tablet TAKE 1 TABLET EVERY DAY   Melatonin 5 MG TABS Take 1 tablet by mouth as needed.   montelukast (SINGULAIR) 10 MG tablet Take 10 mg by mouth daily.    nitroGLYCERIN (NITROSTAT) 0.4 MG SL tablet Place 0.4 mg under the tongue every 5 (five) minutes as needed for chest pain.    Probiotic Product (PROBIOTIC ADVANCED PO) Take 2 capsules by mouth daily.    tamsulosin (FLOMAX) 0.4 MG CAPS capsule Take 0.4 mg by mouth daily.   Vitamin D, Ergocalciferol, (DRISDOL) 50000 units CAPS capsule Take 1 capsule by mouth once a week.     Allergies:   Pollen extract   Social History   Socioeconomic History   Marital status: Married    Spouse name: Not on file   Number of children: Not on file   Years of education: Not on file   Highest education level: Not on file  Occupational History   Not on file  Social Needs   Financial resource strain: Not on file   Food insecurity    Worry: Not on file    Inability: Not on file   Transportation needs    Medical: Not on file    Non-medical: Not on file  Tobacco Use   Smoking status: Former Smoker    Types: Cigarettes   Smokeless tobacco: Never Used  Substance and Sexual Activity   Alcohol use: Yes    Alcohol/week: 2.0 - 3.0 standard drinks    Types: 2 - 3 Glasses of wine per week    Comment: daily   Drug use: No   Sexual activity: Not on file  Lifestyle   Physical activity    Days per week: Not on file    Minutes per session: Not on file   Stress: Not on file  Relationships   Social connections    Talks on phone: Not on file    Gets together: Not on file    Attends religious service: Not on file    Active member of club or organization: Not on file    Attends meetings of clubs or organizations: Not on file    Relationship status: Not on file  Other Topics Concern   Not on file  Social History Narrative   Not on file     Family History: The patient's family history includes Stroke in his mother. ROS:   Review of Systems  Constitution: Negative for chills, fever and malaise/fatigue.  Cardiovascular: Positive for leg swelling (LE edema at baseline). Negative for  chest pain, dyspnea on exertion, near-syncope, orthopnea, palpitations and syncope.  Respiratory: Negative for cough, shortness of breath and wheezing.   Neurological: Positive for light-headedness. Negative for dizziness and weakness.   Please see the history of present illness.    All other systems reviewed and are negative.  EKGs/Labs/Other Studies Reviewed:    The following studies were reviewed today:  EKG:  EKG ordered today and personally reviewed.  The ekg ordered today demonstrates trigeminy - SR with frequent  PVC's - rate 60. No acute ST/T wave changes.   Recent Labs: 02/20/2018: Hemoglobin 13.5; Platelets 172 03/29/2018: ALT 38 08/21/2018: BUN 14; Creatinine, Ser 0.91; NT-Pro BNP 653; Potassium 4.9; Sodium 140  Recent Lipid Panel    Component Value Date/Time   CHOL 123 03/29/2018 0933   TRIG 47 03/29/2018 0933   HDL 57 03/29/2018 0933   CHOLHDL 2.2 03/29/2018 0933   LDLCALC 57 03/29/2018 0933    Physical Exam:    VS:  BP 112/76 (BP Location: Right Arm, Patient Position: Sitting, Cuff Size: Large)    Pulse 60    Ht 5\' 7"  (1.702 m)    Wt 268 lb (121.6 kg)    SpO2 95%    BMI 41.97 kg/m     Wt Readings from Last 3 Encounters:  11/21/18 268 lb (121.6 kg)  11/21/18 267 lb (121.1 kg)  08/16/18 257 lb (116.6 kg)     GEN:  Well nourished, overweight,  well developed in no acute distress HEENT: Normal NECK: No JVD; No carotid bruits LYMPHATICS: No lymphadenopathy CARDIAC: irregular, no murmurs, rubs, gallops RESPIRATORY:  Clear to auscultation without rales, wheezing or rhonchi  ABDOMEN: Soft, non-tender, non-distended MUSCULOSKELETAL:  Bilateral lower extremity non pitting edema; No deformity  SKIN: Warm and dry NEUROLOGIC:  Alert and oriented x 3 PSYCHIATRIC:  Normal affect    Signed, Shirlee More, MD  11/21/2018 1:05 PM    [facility]

## 2018-11-25 ENCOUNTER — Other Ambulatory Visit (INDEPENDENT_AMBULATORY_CARE_PROVIDER_SITE_OTHER): Payer: Medicare Other

## 2018-11-25 DIAGNOSIS — R001 Bradycardia, unspecified: Secondary | ICD-10-CM | POA: Diagnosis not present

## 2018-12-04 DIAGNOSIS — E785 Hyperlipidemia, unspecified: Secondary | ICD-10-CM | POA: Diagnosis not present

## 2018-12-04 DIAGNOSIS — Z139 Encounter for screening, unspecified: Secondary | ICD-10-CM | POA: Diagnosis not present

## 2018-12-04 DIAGNOSIS — Z1331 Encounter for screening for depression: Secondary | ICD-10-CM | POA: Diagnosis not present

## 2018-12-04 DIAGNOSIS — Z125 Encounter for screening for malignant neoplasm of prostate: Secondary | ICD-10-CM | POA: Diagnosis not present

## 2018-12-04 DIAGNOSIS — Z0000 Encounter for general adult medical examination without abnormal findings: Secondary | ICD-10-CM | POA: Diagnosis not present

## 2018-12-04 DIAGNOSIS — Z9181 History of falling: Secondary | ICD-10-CM | POA: Diagnosis not present

## 2018-12-09 ENCOUNTER — Other Ambulatory Visit: Payer: Self-pay | Admitting: Cardiology

## 2018-12-09 DIAGNOSIS — R001 Bradycardia, unspecified: Secondary | ICD-10-CM | POA: Diagnosis not present

## 2019-01-14 DIAGNOSIS — G4733 Obstructive sleep apnea (adult) (pediatric): Secondary | ICD-10-CM | POA: Diagnosis not present

## 2019-01-14 DIAGNOSIS — J301 Allergic rhinitis due to pollen: Secondary | ICD-10-CM | POA: Diagnosis not present

## 2019-01-14 DIAGNOSIS — I509 Heart failure, unspecified: Secondary | ICD-10-CM | POA: Diagnosis not present

## 2019-01-14 DIAGNOSIS — R0982 Postnasal drip: Secondary | ICD-10-CM | POA: Diagnosis not present

## 2019-01-14 DIAGNOSIS — J4540 Moderate persistent asthma, uncomplicated: Secondary | ICD-10-CM | POA: Diagnosis not present

## 2019-01-24 ENCOUNTER — Encounter: Payer: Self-pay | Admitting: Cardiology

## 2019-01-24 ENCOUNTER — Ambulatory Visit (INDEPENDENT_AMBULATORY_CARE_PROVIDER_SITE_OTHER): Payer: Medicare Other | Admitting: Cardiology

## 2019-01-24 ENCOUNTER — Other Ambulatory Visit: Payer: Self-pay

## 2019-01-24 VITALS — BP 124/64 | HR 73 | Ht 67.0 in | Wt 267.0 lb

## 2019-01-24 DIAGNOSIS — E785 Hyperlipidemia, unspecified: Secondary | ICD-10-CM

## 2019-01-24 DIAGNOSIS — I2541 Coronary artery aneurysm: Secondary | ICD-10-CM | POA: Diagnosis not present

## 2019-01-24 DIAGNOSIS — G4733 Obstructive sleep apnea (adult) (pediatric): Secondary | ICD-10-CM

## 2019-01-24 DIAGNOSIS — I10 Essential (primary) hypertension: Secondary | ICD-10-CM

## 2019-01-24 DIAGNOSIS — I2510 Atherosclerotic heart disease of native coronary artery without angina pectoris: Secondary | ICD-10-CM

## 2019-01-24 NOTE — Addendum Note (Signed)
Addended by: Ashok Norris on: 01/24/2019 11:33 AM   Modules accepted: Orders

## 2019-01-24 NOTE — Progress Notes (Signed)
Cardiology Office Note:    Date:  01/24/2019   ID:  Phillip Dunn, DOB 07-23-1950, MRN [id number]  PCP:  Renaldo Reel, PA  Cardiologist:  Jenne Campus, MD    Referring MD: Renaldo Reel, [place]   Chief Complaint  Patient presents with  . Follow up on monitor  Doing well  History of Present Illness:    Phillip Dunn is a 68 y.o. male with coronary disease also coronary artery aneurysm, bradycardia, diabetes mellitus comes today to my for follow-up he did wear monitor which showed 6% burden of PVCs, there were no sustained arrhythmia he is completely asymptomatic.  Denies have any chest pain, tightness, pressure, burning in the chest.  Recently his beta-blocker has been withdrawn because of bradycardia overall he is doing well  Past Medical History:  Diagnosis Date  . 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 Asthma   . 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 Bradycardia   . COPD (chronic obstructive pulmonary disease) ([place])   . Coronary artery disease   . Diabetes mellitus without complication ([place])   . Hepatitis   . Hyperlipidemia   . Myocardial infarct ([place])   . OSA (obstructive sleep apnea)   . Peptic ulcer   . Primary insomnia   . Secondary hypotension   . Venous insufficiency of left lower extremity     Past Surgical History:  Procedure Laterality Date  . CARDIAC CATHETERIZATION    . CARPAL TUNNEL RELEASE Left   . CATARACT EXTRACTION, BILATERAL    . CORONARY ANGIOPLASTY    . CORONARY ARTERY BYPASS GRAFT      Current Medications: Current Meds  Medication Sig  . albuterol (VENTOLIN HFA) 108 (90 Base) MCG/ACT inhaler Inhale 2 puffs into the lungs every 4 (four) hours as needed for wheezing.  Marland Kitchen aspirin EC 81 MG tablet Take 81 mg by mouth daily.  Marland Kitchen atorvastatin (LIPITOR) 80 MG tablet TAKE 1 TABLET EVERY DAY  . budesonide-formoterol (SYMBICORT) 160-4.5 MCG/ACT inhaler Inhale 2 puffs into the lungs 2 (two) times daily.  .  clopidogrel (PLAVIX) 75 MG tablet TAKE 1 TABLET EVERY DAY  . Coenzyme Q10 (COQ-10) 10 MG CAPS Take 10 mg by mouth daily.  . DOCOSAHEXAENOIC ACID PO Take 1 capsule by mouth daily.  . finasteride (PROSCAR) 5 MG tablet TAKE 1 TABLET EVERY DAY  . furosemide (LASIX) 40 MG tablet TAKE 3 TABLETS EVERY DAY  . ipratropium (ATROVENT) 0.03 % nasal spray Place 1 spray into both nostrils every 12 (twelve) hours.  Marland Kitchen lisinopril (ZESTRIL) 2.5 MG tablet TAKE 1 TABLET EVERY DAY  . Melatonin 5 MG TABS Take 1 tablet by mouth as needed.  . montelukast (SINGULAIR) 10 MG tablet Take 10 mg by mouth daily.  . nitroGLYCERIN (NITROSTAT) 0.4 MG SL tablet Place 0.4 mg under the tongue every 5 (five) minutes as needed for chest pain.   . tamsulosin (FLOMAX) 0.4 MG CAPS capsule Take 0.4 mg by mouth daily.  . Vitamin D, Ergocalciferol, (DRISDOL) 50000 units CAPS capsule Take 1 capsule by mouth once a week.     Allergies:   Pollen extract   Social History   Socioeconomic History  . Marital status: Married    Spouse name: Not on file  . Number of children: Not on file  . Years of education: Not on file  . Highest education level: Not on file  Occupational History  . Not on file  Social Needs  . Emergency planning/management officer  strain: Not on file  . Food insecurity    Worry: Not on file    Inability: Not on file  . Transportation needs    Medical: Not on file    Non-medical: Not on file  Tobacco Use  . Smoking status: Former Smoker    Types: Cigarettes  . Smokeless tobacco: Never Used  Substance and Sexual Activity  . Alcohol use: Yes    Alcohol/week: 2.0 - 3.0 standard drinks    Types: 2 - 3 Glasses of wine per week    Comment: daily  . Drug use: No  . Sexual activity: Not on file  Lifestyle  . Physical activity    Days per week: Not on file    Minutes per session: Not on file  . Stress: Not on file  Relationships  . Social Herbalist on phone: Not on file    Gets together: Not on file    Attends  religious service: Not on file    Active member of club or organization: Not on file    Attends meetings of clubs or organizations: Not on file    Relationship status: Not on file  Other Topics Concern  . Not on file  Social History Narrative  . Not on file     Family History: The patient's family history includes Stroke in his mother. ROS:   Please see the history of present illness.    All 14 point review of systems negative except as described per history of present illness  EKGs/Labs/Other Studies Reviewed:      Recent Labs: 02/20/2018: Hemoglobin 13.5; Platelets 172 03/29/2018: ALT 38 08/21/2018: BUN 14; Creatinine, Ser 0.91; NT-Pro BNP 653; Potassium 4.9; Sodium 140  Recent Lipid Panel    Component Value Date/Time   CHOL 123 03/29/2018 0933   TRIG 47 03/29/2018 0933   HDL 57 03/29/2018 0933   CHOLHDL 2.2 03/29/2018 0933   LDLCALC 57 03/29/2018 0933    Physical Exam:    VS:  BP 124/64   Pulse 73   Ht 5\' 7"  (1.702 m)   Wt 267 lb (121.1 kg)   SpO2 94%   BMI 41.82 kg/m     Wt Readings from Last 3 Encounters:  01/24/19 267 lb (121.1 kg)  11/21/18 268 lb (121.6 kg)  11/21/18 267 lb (121.1 kg)     GEN:  Well nourished, well developed in no acute distress HEENT: Normal NECK: No JVD; No carotid bruits LYMPHATICS: No lymphadenopathy CARDIAC: RRR, no murmurs, no rubs, no gallops RESPIRATORY:  Clear to auscultation without rales, wheezing or rhonchi  ABDOMEN: Soft, non-tender, non-distended MUSCULOSKELETAL:  No edema; No deformity  SKIN: Warm and dry LOWER EXTREMITIES: no swelling NEUROLOGIC:  Alert and oriented x 3 PSYCHIATRIC:  Normal affect   ASSESSMENT:    1. Coronary artery disease involving native coronary artery of native heart without angina pectoris   2. Coronary artery aneurysm   3. Essential hypertension   4. Obstructive sleep apnea syndrome   5. Dyslipidemia    PLAN:    In order of problems listed above:  1. Coronary artery disease  stable from that point review 2. Coronary artery aneurysm next year we will recheck his CT angio of coronary arteries to look at the size of the arteries so far has been stable 3. Essential hypertension blood pressure well controlled continue present management 4. Obstructive sleep apnea followed by 10 medicine team 5. Dyslipidemia we will check his fasting lipid profile today  Schedule him to have another echocardiogram in February I see him back in March and at that time we will do CT of his chest   Medication Adjustments/Labs and Tests Ordered: Current medicines are reviewed at length with the patient today.  Concerns regarding medicines are outlined above.  No orders of the defined types were placed in this encounter.  Medication changes: No orders of the defined types were placed in this encounter.   Signed, Park Liter, MD, [facility] 01/24/2019 11:25 AM    [place]

## 2019-01-24 NOTE — Addendum Note (Signed)
Addended by: Ashok Norris on: 01/24/2019 11:31 AM   Modules accepted: Orders

## 2019-01-24 NOTE — Patient Instructions (Signed)
Medication Instructions:  Your physician recommends that you continue on your current medications as directed. Please refer to the Current Medication list given to you today.  *If you need a refill on your cardiac medications before your next appointment, please call your pharmacy*  Lab Work: None.  If you have labs (blood work) drawn today and your tests are completely normal, you will receive your results only by: . MyChart Message (if you have MyChart) OR . A paper copy in the mail If you have any lab test that is abnormal or we need to change your treatment, we will call you to review the results.  Testing/Procedures: Your physician has requested that you have an echocardiogram. Echocardiography is a painless test that uses sound waves to create images of your heart. It provides your doctor with information about the size and shape of your heart and how well your heart's chambers and valves are working. This procedure takes approximately one hour. There are no restrictions for this procedure.    Follow-Up: At [facility], you and your health needs are our priority.  As part of our continuing mission to provide you with exceptional heart care, we have created designated Provider Care Teams.  These Care Teams include your primary Cardiologist (physician) and Advanced Practice Providers (APPs -  Physician Assistants and Nurse Practitioners) who all work together to provide you with the care you need, when you need it.  Your next appointment:   5 months  The format for your next appointment:   In Person  Provider:   Robert Krasowski, MD  Other Instructions   Echocardiogram An echocardiogram is a procedure that uses painless sound waves (ultrasound) to produce an image of the heart. Images from an echocardiogram can provide important information about:  Signs of coronary artery disease (CAD).  Aneurysm detection. An aneurysm is a weak or damaged part of an artery wall that  bulges out from the normal force of blood pumping through the body.  Heart size and shape. Changes in the size or shape of the heart can be associated with certain conditions, including heart failure, aneurysm, and CAD.  Heart muscle function.  Heart valve function.  Signs of a past heart attack.  Fluid buildup around the heart.  Thickening of the heart muscle.  A tumor or infectious growth around the heart valves. Tell a health care provider about:  Any allergies you have.  All medicines you are taking, including vitamins, herbs, eye drops, creams, and over-the-counter medicines.  Any blood disorders you have.  Any surgeries you have had.  Any medical conditions you have.  Whether you are pregnant or may be pregnant. What are the risks? Generally, this is a safe procedure. However, problems may occur, including:  Allergic reaction to dye (contrast) that may be used during the procedure. What happens before the procedure? No specific preparation is needed. You may eat and drink normally. What happens during the procedure?   An IV tube may be inserted into one of your veins.  You may receive contrast through this tube. A contrast is an injection that improves the quality of the pictures from your heart.  A gel will be applied to your chest.  A wand-like tool (transducer) will be moved over your chest. The gel will help to transmit the sound waves from the transducer.  The sound waves will harmlessly bounce off of your heart to allow the heart images to be captured in real-time motion. The images will be recorded   on a computer. The procedure may vary among health care providers and hospitals. What happens after the procedure?  You may return to your normal, everyday life, including diet, activities, and medicines, unless your health care provider tells you not to do that. Summary  An echocardiogram is a procedure that uses painless sound waves (ultrasound) to produce  an image of the heart.  Images from an echocardiogram can provide important information about the size and shape of your heart, heart muscle function, heart valve function, and fluid buildup around your heart.  You do not need to do anything to prepare before this procedure. You may eat and drink normally.  After the echocardiogram is completed, you may return to your normal, everyday life, unless your health care provider tells you not to do that. This information is not intended to replace advice given to you by your health care provider. Make sure you discuss any questions you have with your health care provider. Document Released: 03/17/2000 Document Revised: 07/11/2018 Document Reviewed: 04/22/2016 Elsevier Patient Education  2020 [employer]   

## 2019-01-25 LAB — LIPID PANEL
Chol/HDL Ratio: 2.9 ratio (ref 0.0–5.0)
Cholesterol, Total: 122 mg/dL (ref 100–199)
HDL: 42 mg/dL (ref >39)
LDL Chol Calc (NIH): 58 mg/dL (ref 0–99)
Triglycerides: 123 mg/dL (ref 0–149)
VLDL Cholesterol Cal: 22 mg/dL (ref 5–40)

## 2019-01-28 DIAGNOSIS — M79601 Pain in right arm: Secondary | ICD-10-CM | POA: Diagnosis not present

## 2019-01-28 DIAGNOSIS — M542 Cervicalgia: Secondary | ICD-10-CM | POA: Diagnosis not present

## 2019-01-28 DIAGNOSIS — M792 Neuralgia and neuritis, unspecified: Secondary | ICD-10-CM | POA: Diagnosis not present

## 2019-02-03 DIAGNOSIS — M542 Cervicalgia: Secondary | ICD-10-CM | POA: Diagnosis not present

## 2019-02-03 DIAGNOSIS — M5412 Radiculopathy, cervical region: Secondary | ICD-10-CM | POA: Diagnosis not present

## 2019-02-14 DIAGNOSIS — M542 Cervicalgia: Secondary | ICD-10-CM | POA: Diagnosis not present

## 2019-02-24 ENCOUNTER — Other Ambulatory Visit: Payer: Self-pay | Admitting: Cardiology

## 2019-02-25 NOTE — Telephone Encounter (Signed)
Lasix refill sent to [facility]

## 2019-02-26 ENCOUNTER — Other Ambulatory Visit: Payer: Medicare Other

## 2019-03-03 DIAGNOSIS — R739 Hyperglycemia, unspecified: Secondary | ICD-10-CM | POA: Diagnosis not present

## 2019-03-03 DIAGNOSIS — Z2821 Immunization not carried out because of patient refusal: Secondary | ICD-10-CM | POA: Diagnosis not present

## 2019-03-03 DIAGNOSIS — Z79899 Other long term (current) drug therapy: Secondary | ICD-10-CM | POA: Diagnosis not present

## 2019-03-03 DIAGNOSIS — E785 Hyperlipidemia, unspecified: Secondary | ICD-10-CM | POA: Diagnosis not present

## 2019-03-03 DIAGNOSIS — I10 Essential (primary) hypertension: Secondary | ICD-10-CM | POA: Diagnosis not present

## 2019-03-04 DIAGNOSIS — M5412 Radiculopathy, cervical region: Secondary | ICD-10-CM | POA: Diagnosis not present

## 2019-03-04 DIAGNOSIS — M79641 Pain in right hand: Secondary | ICD-10-CM | POA: Diagnosis not present

## 2019-03-04 DIAGNOSIS — G5601 Carpal tunnel syndrome, right upper limb: Secondary | ICD-10-CM | POA: Diagnosis not present

## 2019-03-05 ENCOUNTER — Other Ambulatory Visit: Payer: Self-pay | Admitting: Cardiology

## 2019-03-12 ENCOUNTER — Other Ambulatory Visit: Payer: Self-pay

## 2019-03-20 ENCOUNTER — Other Ambulatory Visit: Payer: Self-pay

## 2019-03-20 MED ORDER — FINASTERIDE 5 MG PO TABS: 5.0000 mg | ORAL_TABLET | Freq: Every day | ORAL | 3 refills | Status: DC

## 2019-03-25 ENCOUNTER — Telehealth: Payer: Self-pay | Admitting: Cardiology

## 2019-03-25 DIAGNOSIS — R000 Tachycardia, unspecified: Secondary | ICD-10-CM | POA: Diagnosis not present

## 2019-03-25 DIAGNOSIS — N400 Enlarged prostate without lower urinary tract symptoms: Secondary | ICD-10-CM | POA: Diagnosis present

## 2019-03-25 DIAGNOSIS — I499 Cardiac arrhythmia, unspecified: Secondary | ICD-10-CM | POA: Diagnosis not present

## 2019-03-25 DIAGNOSIS — I5032 Chronic diastolic (congestive) heart failure: Secondary | ICD-10-CM | POA: Diagnosis present

## 2019-03-25 DIAGNOSIS — R0789 Other chest pain: Secondary | ICD-10-CM | POA: Diagnosis not present

## 2019-03-25 DIAGNOSIS — I213 ST elevation (STEMI) myocardial infarction of unspecified site: Secondary | ICD-10-CM | POA: Diagnosis not present

## 2019-03-25 DIAGNOSIS — Z823 Family history of stroke: Secondary | ICD-10-CM | POA: Diagnosis not present

## 2019-03-25 DIAGNOSIS — I872 Venous insufficiency (chronic) (peripheral): Secondary | ICD-10-CM | POA: Diagnosis present

## 2019-03-25 DIAGNOSIS — E785 Hyperlipidemia, unspecified: Secondary | ICD-10-CM | POA: Diagnosis present

## 2019-03-25 DIAGNOSIS — R9431 Abnormal electrocardiogram [ECG] [EKG]: Secondary | ICD-10-CM | POA: Diagnosis not present

## 2019-03-25 DIAGNOSIS — R079 Chest pain, unspecified: Secondary | ICD-10-CM | POA: Diagnosis not present

## 2019-03-25 DIAGNOSIS — Z951 Presence of aortocoronary bypass graft: Secondary | ICD-10-CM | POA: Diagnosis not present

## 2019-03-25 DIAGNOSIS — Z7902 Long term (current) use of antithrombotics/antiplatelets: Secondary | ICD-10-CM | POA: Diagnosis not present

## 2019-03-25 DIAGNOSIS — Z7951 Long term (current) use of inhaled steroids: Secondary | ICD-10-CM | POA: Diagnosis not present

## 2019-03-25 DIAGNOSIS — I10 Essential (primary) hypertension: Secondary | ICD-10-CM | POA: Diagnosis not present

## 2019-03-25 DIAGNOSIS — I2510 Atherosclerotic heart disease of native coronary artery without angina pectoris: Secondary | ICD-10-CM | POA: Diagnosis present

## 2019-03-25 DIAGNOSIS — R001 Bradycardia, unspecified: Secondary | ICD-10-CM | POA: Diagnosis not present

## 2019-03-25 DIAGNOSIS — I2119 ST elevation (STEMI) myocardial infarction involving other coronary artery of inferior wall: Secondary | ICD-10-CM | POA: Diagnosis present

## 2019-03-25 DIAGNOSIS — Z6830 Body mass index (BMI) 30.0-30.9, adult: Secondary | ICD-10-CM | POA: Diagnosis not present

## 2019-03-25 DIAGNOSIS — I110 Hypertensive heart disease with heart failure: Secondary | ICD-10-CM | POA: Diagnosis present

## 2019-03-25 DIAGNOSIS — R0602 Shortness of breath: Secondary | ICD-10-CM | POA: Diagnosis not present

## 2019-03-25 DIAGNOSIS — J449 Chronic obstructive pulmonary disease, unspecified: Secondary | ICD-10-CM | POA: Diagnosis present

## 2019-03-25 DIAGNOSIS — Z87891 Personal history of nicotine dependence: Secondary | ICD-10-CM | POA: Diagnosis not present

## 2019-03-25 DIAGNOSIS — E669 Obesity, unspecified: Secondary | ICD-10-CM | POA: Diagnosis present

## 2019-03-25 NOTE — Telephone Encounter (Signed)
Patient is in the hospital at [facility] was transferred from [facility] to there and went straight to cath lab. He is in the hospital still and wanted you to know. They tried to unblock stent but couldn't and started him on Brilinta, just FYI. If you need to call him feel free to at [phone]

## 2019-04-03 DIAGNOSIS — Z961 Presence of intraocular lens: Secondary | ICD-10-CM | POA: Diagnosis not present

## 2019-04-15 ENCOUNTER — Encounter: Payer: Self-pay | Admitting: Cardiology

## 2019-04-15 ENCOUNTER — Ambulatory Visit (INDEPENDENT_AMBULATORY_CARE_PROVIDER_SITE_OTHER): Payer: Medicare Other | Admitting: Cardiology

## 2019-04-15 ENCOUNTER — Other Ambulatory Visit: Payer: Self-pay

## 2019-04-15 VITALS — BP 132/82 | HR 72 | Ht 67.0 in | Wt 274.0 lb

## 2019-04-15 DIAGNOSIS — I509 Heart failure, unspecified: Secondary | ICD-10-CM | POA: Diagnosis not present

## 2019-04-15 DIAGNOSIS — I10 Essential (primary) hypertension: Secondary | ICD-10-CM

## 2019-04-15 DIAGNOSIS — I2541 Coronary artery aneurysm: Secondary | ICD-10-CM | POA: Diagnosis not present

## 2019-04-15 DIAGNOSIS — E785 Hyperlipidemia, unspecified: Secondary | ICD-10-CM | POA: Diagnosis not present

## 2019-04-15 DIAGNOSIS — J301 Allergic rhinitis due to pollen: Secondary | ICD-10-CM | POA: Diagnosis not present

## 2019-04-15 DIAGNOSIS — G4733 Obstructive sleep apnea (adult) (pediatric): Secondary | ICD-10-CM | POA: Diagnosis not present

## 2019-04-15 DIAGNOSIS — J4540 Moderate persistent asthma, uncomplicated: Secondary | ICD-10-CM | POA: Diagnosis not present

## 2019-04-15 DIAGNOSIS — R0982 Postnasal drip: Secondary | ICD-10-CM | POA: Diagnosis not present

## 2019-04-15 DIAGNOSIS — I2510 Atherosclerotic heart disease of native coronary artery without angina pectoris: Secondary | ICD-10-CM

## 2019-04-15 LAB — LIPID PANEL
Chol/HDL Ratio: 2.7 ratio (ref 0.0–5.0)
Cholesterol, Total: 102 mg/dL (ref 100–199)
HDL: 38 mg/dL — ABNORMAL LOW (ref >39)
LDL Chol Calc (NIH): 49 mg/dL (ref 0–99)
Triglycerides: 72 mg/dL (ref 0–149)
VLDL Cholesterol Cal: 15 mg/dL (ref 5–40)

## 2019-04-15 NOTE — Patient Instructions (Signed)
Medication Instructions:  Your physician recommends that you continue on your current medications as directed. Please refer to the Current Medication list given to you today.  *If you need a refill on your cardiac medications before your next appointment, please call your pharmacy*  Lab Work: Your physician recommends that you return for lab work today: lipids   If you have labs (blood work) drawn today and your tests are completely normal, you will receive your results only by: Marland Kitchen MyChart Message (if you have MyChart) OR . A paper copy in the mail If you have any lab test that is abnormal or we need to change your treatment, we will call you to review the results.  Testing/Procedures: Your physician has requested that you have an echocardiogram. Echocardiography is a painless test that uses sound waves to create images of your heart. It provides your doctor with information about the size and shape of your heart and how well your heart's chambers and valves are working. This procedure takes approximately one hour. There are no restrictions for this procedure.    Follow-Up: At [facility], you and your health needs are our priority.  As part of our continuing mission to provide you with exceptional heart care, we have created designated Provider Care Teams.  These Care Teams include your primary Cardiologist (physician) and Advanced Practice Providers (APPs -  Physician Assistants and Nurse Practitioners) who all work together to provide you with the care you need, when you need it.  Your next appointment:   2 month(s)  The format for your next appointment:   In Person  Provider:   Jenne Campus, MD  Other Instructions   Echocardiogram An echocardiogram is a procedure that uses painless sound waves (ultrasound) to produce an image of the heart. Images from an echocardiogram can provide important information about:  Signs of coronary artery disease (CAD).  Aneurysm  detection. An aneurysm is a weak or damaged part of an artery wall that bulges out from the normal force of blood pumping through the body.  Heart size and shape. Changes in the size or shape of the heart can be associated with certain conditions, including heart failure, aneurysm, and CAD.  Heart muscle function.  Heart valve function.  Signs of a past heart attack.  Fluid buildup around the heart.  Thickening of the heart muscle.  A tumor or infectious growth around the heart valves. Tell a health care provider about:  Any allergies you have.  All medicines you are taking, including vitamins, herbs, eye drops, creams, and over-the-counter medicines.  Any blood disorders you have.  Any surgeries you have had.  Any medical conditions you have.  Whether you are pregnant or may be pregnant. What are the risks? Generally, this is a safe procedure. However, problems may occur, including:  Allergic reaction to dye (contrast) that may be used during the procedure. What happens before the procedure? No specific preparation is needed. You may eat and drink normally. What happens during the procedure?   An IV tube may be inserted into one of your veins.  You may receive contrast through this tube. A contrast is an injection that improves the quality of the pictures from your heart.  A gel will be applied to your chest.  A wand-like tool (transducer) will be moved over your chest. The gel will help to transmit the sound waves from the transducer.  The sound waves will harmlessly bounce off of your heart to allow the heart images  to be captured in real-time motion. The images will be recorded on a computer. The procedure may vary among health care providers and hospitals. What happens after the procedure?  You may return to your normal, everyday life, including diet, activities, and medicines, unless your health care provider tells you not to do that. Summary  An  echocardiogram is a procedure that uses painless sound waves (ultrasound) to produce an image of the heart.  Images from an echocardiogram can provide important information about the size and shape of your heart, heart muscle function, heart valve function, and fluid buildup around your heart.  You do not need to do anything to prepare before this procedure. You may eat and drink normally.  After the echocardiogram is completed, you may return to your normal, everyday life, unless your health care provider tells you not to do that. This information is not intended to replace advice given to you by your health care provider. Make sure you discuss any questions you have with your health care provider. Document Revised: 07/11/2018 Document Reviewed: 04/22/2016 Elsevier Patient Education  [place].

## 2019-04-15 NOTE — Progress Notes (Signed)
Cardiology Office Note:    Date:  04/15/2019   ID:  Phillip Dunn, DOB [birth year], MRN [id number]  PCP:  Renaldo Reel, PA  Cardiologist:  Jenne Campus, MD    Referring MD: Renaldo Reel, [place]   Chief Complaint  Patient presents with  . Follow-up  Having a hospital  History of Present Illness:    Phillip Dunn is a 69 y.o. male with complex past medical history, coronary artery disease with multiple interventions, COPD, diabetes, dyslipidemia.  He is being on dual antiplatelet therapy for long time however just few weeks ago that therapy has been held for surgery for his back.  Within days he started having some problems and eventually end up having acute STEMI which involves obtuse marginal 1 branch.  He ended up being urgently transferred to [facility], cardiac catheterization has been done, drug-eluting stent was implanted into the obtuse marginal branch.  He recovered quite nicely he said he feels 75 first percent back to its normal.  He complained of having some fatigue tiredness and shortness of breath but overall seems to be doing quite okay.  Past Medical History:  Diagnosis Date  . 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 Asthma   . Benign hypertensive heart disease with CHF (congestive heart failure) ([place])   . Bradycardia   . COPD (chronic obstructive pulmonary disease) ([place])   . Coronary artery disease   . Diabetes mellitus without complication ([place])   . Hepatitis   . Hyperlipidemia   . Myocardial infarct ([place])   . OSA (obstructive sleep apnea)   . Peptic ulcer   . Primary insomnia   . Secondary hypotension   . Venous insufficiency of left lower extremity     Past Surgical History:  Procedure Laterality Date  . CARDIAC CATHETERIZATION    . CARPAL TUNNEL RELEASE Left   . CATARACT EXTRACTION, BILATERAL    . CORONARY ANGIOPLASTY    . CORONARY ARTERY BYPASS GRAFT      Current Medications: Current Meds  Medication Sig  . albuterol (VENTOLIN HFA)  108 (90 Base) MCG/ACT inhaler Inhale 2 puffs into the lungs every 4 (four) hours as needed for wheezing.  Marland Kitchen aspirin EC 81 MG tablet Take 81 mg by mouth daily.  Marland Kitchen atorvastatin (LIPITOR) 80 MG tablet TAKE 1 TABLET EVERY DAY  . clopidogrel (PLAVIX) 75 MG tablet TAKE 1 TABLET EVERY DAY  . Coenzyme Q10 (COQ-10) 10 MG CAPS Take 10 mg by mouth daily.  . DOCOSAHEXAENOIC ACID PO Take 1 capsule by mouth daily.  . finasteride (PROSCAR) 5 MG tablet Take 1 tablet (5 mg total) by mouth daily.  . furosemide (LASIX) 40 MG tablet TAKE 3 TABLETS EVERY DAY  . ipratropium (ATROVENT) 0.03 % nasal spray Place 1 spray into both nostrils every 12 (twelve) hours.  Marland Kitchen lisinopril (ZESTRIL) 2.5 MG tablet TAKE 1 TABLET EVERY DAY  . Melatonin 5 MG TABS Take 1 tablet by mouth as needed.  . nitroGLYCERIN (NITROSTAT) 0.4 MG SL tablet Place 0.4 mg under the tongue every 5 (five) minutes as needed for chest pain.   . tamsulosin (FLOMAX) 0.4 MG CAPS capsule Take 0.4 mg by mouth daily.     Allergies:   Pollen extract   Social History   Socioeconomic History  . Marital status: Married    Spouse name: Not on file  . Number of children: Not on file  . Years of education: Not on file  . Highest education level: Not on file  Occupational History  . Not on file  Tobacco Use  . Smoking status: Former Smoker    Types: Cigarettes  . Smokeless tobacco: Never Used  Substance and Sexual Activity  . Alcohol use: Yes    Alcohol/week: 2.0 - 3.0 standard drinks    Types: 2 - 3 Glasses of wine per week    Comment: daily  . Drug use: No  . Sexual activity: Not on file  Other Topics Concern  . Not on file  Social History Narrative  . Not on file   Social Determinants of Health   Financial Resource Strain:   . Difficulty of Paying Living Expenses: Not on file  Food Insecurity:   . Worried About Charity fundraiser in the Last Year: Not on file  . Ran Out of Food in the Last Year: Not on file  Transportation Needs:   . Lack  of Transportation (Medical): Not on file  . Lack of Transportation (Non-Medical): Not on file  Physical Activity:   . Days of Exercise per Week: Not on file  . Minutes of Exercise per Session: Not on file  Stress:   . Feeling of Stress : Not on file  Social Connections:   . Frequency of Communication with Friends and Family: Not on file  . Frequency of Social Gatherings with Friends and Family: Not on file  . Attends Religious Services: Not on file  . Active Member of Clubs or Organizations: Not on file  . Attends Archivist Meetings: Not on file  . Marital Status: Not on file     Family History: The patient's family history includes Stroke in his mother. ROS:   Please see the history of present illness.    All 14 point review of systems negative except as described per history of present illness  EKGs/Labs/Other Studies Reviewed:    Cardiac catheterization from 22 December showed:  Cardiac Arteries and Lesion Findings LMCA: 0% and Normal. LAD: Chronic occlusion. LCx: Single stenosis and Acute occlusion.  Lesion on 1st Ob Marg: Proximal subsection.100% stenosis 20 mm length  reduced to 100%. Pre procedure TIMI 0 flow was noted. Post Procedure TIMI I  flow was present. Poor run off was present. The lesion was diagnosed as  High Risk (C). The lesion was tubular.The lesion showed evidence of present  thrombus, mild angulation and unfavorable total occlusion.Lesion plaque is  ruptured.  Devices used  - Abbott 0.014" x 190cm BMW J-Tip PTCI Guidewire. Number of passes: 1.  - BS 0.014"x182cm ChoicePT Extra Support-J PTCI wire. Number of passes:  2.  - 2.5x15 Euphora. 1 inflation(s) to a max pressure of: 8 atm.  - ASAHI 0.014" X 190cm FielderR XT PTCI guidewire. Number of passes: 1.  - 2.5x15 Euphora. 4 inflation(s) to a max pressure of: 10 atm.  - Abbott 0.014" x 190cm BMW J-Tip PTCI Guidewire. Number of passes: 1. RCA: Chronic occlusion. Cardiac  Collaterals  - Moderatecollateral flow from the Dist CX to the Dist RCA. Procedure Data Procedure Date Date: 03/25/2019 Start: 08:14  Recent Labs: 08/21/2018: BUN 14; Creatinine, Ser 0.91; NT-Pro BNP 653; Potassium 4.9; Sodium 140  Recent Lipid Panel    Component Value Date/Time   CHOL 122 01/24/2019 1143   TRIG 123 01/24/2019 1143   HDL 42 01/24/2019 1143   CHOLHDL 2.9 01/24/2019 1143   LDLCALC 58 01/24/2019 1143    Physical Exam:    VS:  BP 132/82   Pulse 72   Ht _0  (  1.702 m)   Wt 274 lb (124.3 kg)   SpO2 94%   BMI 42.91 kg/m     Wt Readings from Last 3 Encounters:  04/15/19 274 lb (124.3 kg)  01/24/19 267 lb (121.1 kg)  11/21/18 268 lb (121.6 kg)     GEN:  Well nourished, well developed in no acute distress HEENT: Normal NECK: No JVD; No carotid bruits LYMPHATICS: No lymphadenopathy CARDIAC: RRR, no murmurs, no rubs, no gallops RESPIRATORY:  Clear to auscultation without rales, wheezing or rhonchi  ABDOMEN: Soft, non-tender, non-distended MUSCULOSKELETAL:  No edema; No deformity  SKIN: Warm and dry LOWER EXTREMITIES: no swelling NEUROLOGIC:  Alert and oriented x 3 PSYCHIATRIC:  Normal affect   ASSESSMENT:    1. Coronary artery disease involving native coronary artery of native heart without angina pectoris   2. Dyslipidemia   3. Essential hypertension   4. Coronary artery aneurysm    PLAN:    In order of problems listed above:  1. Coronary artery disease status post recent drug-eluting stent to obtuse marginal branch in face of acute myocardial infarction.  Recovering nicely we reviewed all his medication he is on dual antiplatelets therapy which he will be on and definitely.  I will check his fasting lipid profile today.  I will also schedule him to have an echocardiogram in about 2 months to recheck left ventricle ejection fraction. 2. Dyslipidemia we will check his fasting lipid profile today. 3. Essential hypertension blood pressure well  controlled continue present management. 4. History of coronary artery aneurysm but no mentioning of it on latest cardiac catheterization.  We spent a great deal of time talking about what happened He what was fine on cardiac catheterization.  He is very grateful for the care he received.   Medication Adjustments/Labs and Tests Ordered: Current medicines are reviewed at length with the patient today.  Concerns regarding medicines are outlined above.  No orders of the defined types were placed in this encounter.  Medication changes: No orders of the defined types were placed in this encounter.   Signed, Park Liter, MD, [facility], [place] 04/15/2019 9:09 AM    [place]

## 2019-04-18 ENCOUNTER — Telehealth: Payer: Self-pay | Admitting: Emergency Medicine

## 2019-04-18 NOTE — Telephone Encounter (Signed)
Left results on patient's voicemail per dpr. Advised patient to call with any questions.

## 2019-05-03 ENCOUNTER — Other Ambulatory Visit: Payer: Self-pay | Admitting: Cardiology

## 2019-05-07 DIAGNOSIS — M25532 Pain in left wrist: Secondary | ICD-10-CM | POA: Insufficient documentation

## 2019-05-07 DIAGNOSIS — G5601 Carpal tunnel syndrome, right upper limb: Secondary | ICD-10-CM | POA: Diagnosis not present

## 2019-05-07 DIAGNOSIS — M25531 Pain in right wrist: Secondary | ICD-10-CM | POA: Diagnosis not present

## 2019-05-07 HISTORY — DX: Pain in left wrist: M25.532

## 2019-05-08 DIAGNOSIS — G5601 Carpal tunnel syndrome, right upper limb: Secondary | ICD-10-CM

## 2019-05-08 HISTORY — DX: Carpal tunnel syndrome, right upper limb: G56.01

## 2019-05-12 ENCOUNTER — Telehealth (INDEPENDENT_AMBULATORY_CARE_PROVIDER_SITE_OTHER): Payer: Medicare Other | Admitting: Cardiology

## 2019-05-12 ENCOUNTER — Encounter: Payer: Self-pay | Admitting: Cardiology

## 2019-05-12 VITALS — BP 115/76 | HR 58 | Resp 95 | Ht 67.0 in | Wt 264.0 lb

## 2019-05-12 DIAGNOSIS — I2510 Atherosclerotic heart disease of native coronary artery without angina pectoris: Secondary | ICD-10-CM

## 2019-05-12 DIAGNOSIS — I10 Essential (primary) hypertension: Secondary | ICD-10-CM

## 2019-05-12 DIAGNOSIS — E785 Hyperlipidemia, unspecified: Secondary | ICD-10-CM

## 2019-05-12 DIAGNOSIS — I2541 Coronary artery aneurysm: Secondary | ICD-10-CM

## 2019-05-12 NOTE — Patient Instructions (Addendum)
Medication Instructions:  Your physician recommends that you continue on your current medications as directed. Please refer to the Current Medication list given to you today.  *If you need a refill on your cardiac medications before your next appointment, please call your pharmacy*  Lab Work: None.  If you have labs (blood work) drawn today and your tests are completely normal, you will receive your results only by: Marland Kitchen MyChart Message (if you have MyChart) OR . A paper copy in the mail If you have any lab test that is abnormal or we need to change your treatment, we will call you to review the results.  Testing/Procedures: None.   Follow-Up: At [facility], you and your health needs are our priority.  As part of our continuing mission to provide you with exceptional heart care, we have created designated Provider Care Teams.  These Care Teams include your primary Cardiologist (physician) and Advanced Practice Providers (APPs -  Physician Assistants and Nurse Practitioners) who all work together to provide you with the care you need, when you need it.  Your next appointment:   3 month(s)  The format for your next appointment:   In Person  Provider:   You will see Dr. Fraser Din.  Or, you can be scheduled with the following Advanced Practice Provider on your designated Care Team (at our [facility]):  Laurann Montana, FNP    Other Instructions

## 2019-05-12 NOTE — Progress Notes (Signed)
Virtual Visit via Telephone Note   This visit type was conducted due to national recommendations for restrictions regarding the COVID-19 Pandemic (e.g. social distancing) in an effort to limit this patient's exposure and mitigate transmission in our community.  Due to his co-morbid illnesses, this patient is at least at moderate risk for complications without adequate follow up.  This format is felt to be most appropriate for this patient at this time.  The patient did not have access to video technology/had technical difficulties with video requiring transitioning to audio format only (telephone).  All issues noted in this document were discussed and addressed.  No physical exam could be performed with this format.  Please refer to the patient's chart for his  consent to telehealth for [name].  Evaluation Performed:  Follow-up visit  This visit type was conducted due to national recommendations for restrictions regarding the COVID-19 Pandemic (e.g. social distancing).  This format is felt to be most appropriate for this patient at this time.  All issues noted in this document were discussed and addressed.  No physical exam was performed (except for noted visual exam findings with Video Visits).  Please refer to the patient's chart (MyChart message for video visits and phone note for telephone visits) for the patient's consent to telehealth for [name].  Date:  05/12/2019  ID: Phillip Dunn, DOB 07-Nov-1950, MRN [id number]   Patient Location: [place] [address]   Provider location:   [place] Office  PCP:  Renaldo Reel, PA  Cardiologist:  Jenne Campus, MD     Chief Complaint: I need carpal tunnel syndrome surgery  History of Present Illness:    Phillip Dunn is a 69 y.o. male  who presents via audio/video conferencing for a telehealth visit today.  with complex past medical history, coronary artery disease with multiple  interventions, COPD, diabetes, dyslipidemia.  He is being on dual antiplatelet therapy for long time however just few weeks ago that therapy has been held for surgery for his back.  Within days he started having some problems and eventually end up having acute STEMI which involves obtuse marginal 1 branch.  He ended up being urgently transferred to [facility], cardiac catheterization has been done, drug-eluting stent was implanted into the obtuse marginal branch.   He was complaining of having arm/hand pain, he went to orthopedic surgeon who told him he needs surgery and he would like to be evaluated before surgery.  Overall cardiac wise doing well.  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  Overall he recovered quite nicely after his recent coronary event   The patient does not have symptoms concerning for COVID-19 infection (fever, chills, cough, or new SHORTNESS OF BREATH).    Prior CV studies:   The following studies were reviewed today:       Past Medical History:  Diagnosis Date  . 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 Asthma   . Benign hypertensive heart disease with CHF (congestive heart failure) ([place])   . Bradycardia   . COPD (chronic obstructive pulmonary disease) ([place])   . Coronary artery disease   . Diabetes mellitus without complication ([place])   . Hepatitis   . Hyperlipidemia   . Myocardial infarct ([place])   . OSA (obstructive sleep apnea)   . Peptic ulcer   . Primary insomnia   . Secondary hypotension   .  Venous insufficiency of left lower extremity     Past Surgical History:  Procedure Laterality Date  . CARDIAC CATHETERIZATION    . CARPAL TUNNEL RELEASE Left   . CATARACT EXTRACTION, BILATERAL    . CORONARY ANGIOPLASTY    . CORONARY ARTERY BYPASS GRAFT       Current Meds  Medication Sig  . albuterol (VENTOLIN HFA) 108 (90 Base) MCG/ACT inhaler Inhale 2 puffs into the  lungs every 4 (four) hours as needed for wheezing.  Marland Kitchen aspirin EC 81 MG tablet Take 81 mg by mouth daily.  Marland Kitchen atorvastatin (LIPITOR) 80 MG tablet TAKE 1 TABLET EVERY DAY  . budesonide-formoterol (SYMBICORT) 160-4.5 MCG/ACT inhaler Inhale 2 puffs into the lungs 2 (two) times daily.  . clopidogrel (PLAVIX) 75 MG tablet TAKE 1 TABLET EVERY DAY  . Coenzyme Q10 (COQ-10) 10 MG CAPS Take 10 mg by mouth daily.  . DOCOSAHEXAENOIC ACID PO Take 1 capsule by mouth daily.  . finasteride (PROSCAR) 5 MG tablet Take 1 tablet (5 mg total) by mouth daily.  . furosemide (LASIX) 40 MG tablet TAKE 3 TABLETS EVERY DAY  . ipratropium (ATROVENT) 0.03 % nasal spray Place 1 spray into both nostrils every 12 (twelve) hours.  Marland Kitchen lisinopril (ZESTRIL) 2.5 MG tablet TAKE 1 TABLET EVERY DAY  . Melatonin 5 MG TABS Take 1 tablet by mouth as needed.  . nitroGLYCERIN (NITROSTAT) 0.4 MG SL tablet Place 0.4 mg under the tongue every 5 (five) minutes as needed for chest pain.   . tamsulosin (FLOMAX) 0.4 MG CAPS capsule Take 0.4 mg by mouth daily.      Family History: The patient's family history includes Stroke in his mother.   ROS:   Please see the history of present illness.     All other systems reviewed and are negative.   Labs/Other Tests and Data Reviewed:   I did review KP and data on him his LDL is 52 in the summer of last year  Recent Labs: 08/21/2018: BUN 14; Creatinine, Ser 0.91; NT-Pro BNP 653; Potassium 4.9; Sodium 140  Recent Lipid Panel    Component Value Date/Time   CHOL 102 04/15/2019 0929   TRIG 72 04/15/2019 0929   HDL 38 (L) 04/15/2019 0929   CHOLHDL 2.7 04/15/2019 0929   LDLCALC 49 04/15/2019 0929      Exam:    Vital Signs:  BP 115/76   Pulse (!) 58   Resp (!) 95   Ht 5\' 7"  (1.702 m)   Wt 264 lb (119.7 kg)   BMI 41.35 kg/m     Wt Readings from Last 3 Encounters:  05/12/19 264 lb (119.7 kg)  04/15/19 274 lb (124.3 kg)  01/24/19 267 lb (121.1 kg)     Well nourished, well developed  in no acute distress. Alert awake and x3 talking from his shop at home.  I am in our office in [place].  He is asymptomatic doing well  Diagnosis for this visit:   1. Coronary artery disease involving native coronary artery of native heart without angina pectoris   2. Essential hypertension   3. Coronary artery aneurysm   4. Dyslipidemia      ASSESSMENT & PLAN:    1.  Cardiovascular preop evaluation for potential carpal tunnel syndrome surgery.  I told him that ideally after coronary event we need to wait at least 6 favorably 12 months.  If surgery cannot be done under local anesthesia we may pursue surgical be sooner, however the issue of  dual antiplatelets therapy coming to play.  If surgery can be done on dual antiplatelets therapy as well as it can be done under local anesthesia I think it would be fairly reasonable if needed to proceed within the next few months.  Otherwise I would definitely wait until at least 6 months of favorably year.  Taysean tell me that he already received 1 injections to his wrist with help somewhat for short period time we may be able to buy some time for surgery with injection.  That will be up to discretion of hand specialist.  I already spoke to the office of [place], [phone], to discuss this issue in details.  He was not available for conversation but I left my number and he will call me back so can discuss this issue. 2.  Coronary artery disease stable on dual antiplatelet therapy which I will continue. 3.  Essential hypertension blood pressure well controlled. 4.  Coronary arteries.  Not reported on her last cardiac catheterization. 5.  Dyslipidemia on high intensity statin which I will continue.  COVID-19 Education: The signs and symptoms of COVID-19 were discussed with the patient and how to seek care for testing (follow up with PCP or arrange E-visit).  The importance of social distancing was discussed today.  Patient Risk:   After full  review of this patients clinical status, I feel that they are at least moderate risk at this time.  Time:   Today, I have spent 5 minutes with the patient with telehealth technology discussing pt health issues.  I spent 22 minutes reviewing her chart before the visit.  Visit was finished at 9:13 AM.    Medication Adjustments/Labs and Tests Ordered: Current medicines are reviewed at length with the patient today.  Concerns regarding medicines are outlined above.  No orders of the defined types were placed in this encounter.  Medication changes: No orders of the defined types were placed in this encounter.    Disposition: Follow-up as scheduled  Signed, Park Liter, MD, [facility] 05/12/2019 9:10 AM    [place]

## 2019-06-04 ENCOUNTER — Ambulatory Visit (INDEPENDENT_AMBULATORY_CARE_PROVIDER_SITE_OTHER): Payer: Medicare Other

## 2019-06-04 ENCOUNTER — Other Ambulatory Visit: Payer: Self-pay

## 2019-06-04 DIAGNOSIS — I2510 Atherosclerotic heart disease of native coronary artery without angina pectoris: Secondary | ICD-10-CM | POA: Diagnosis not present

## 2019-06-04 NOTE — Progress Notes (Signed)
Complete echocardiogram has been performed.  Jimmy Axton Cihlar RDCS, RVT 

## 2019-06-13 ENCOUNTER — Ambulatory Visit: Payer: Medicare Other | Admitting: Cardiology

## 2019-07-03 DIAGNOSIS — I10 Essential (primary) hypertension: Secondary | ICD-10-CM | POA: Diagnosis not present

## 2019-07-03 DIAGNOSIS — R739 Hyperglycemia, unspecified: Secondary | ICD-10-CM | POA: Diagnosis not present

## 2019-07-03 DIAGNOSIS — Z79899 Other long term (current) drug therapy: Secondary | ICD-10-CM | POA: Diagnosis not present

## 2019-07-03 DIAGNOSIS — E785 Hyperlipidemia, unspecified: Secondary | ICD-10-CM | POA: Diagnosis not present

## 2019-07-04 DIAGNOSIS — G5601 Carpal tunnel syndrome, right upper limb: Secondary | ICD-10-CM | POA: Diagnosis not present

## 2019-07-04 DIAGNOSIS — M67431 Ganglion, right wrist: Secondary | ICD-10-CM | POA: Diagnosis not present

## 2019-07-07 DIAGNOSIS — M67431 Ganglion, right wrist: Secondary | ICD-10-CM

## 2019-07-07 HISTORY — DX: Ganglion, right wrist: M67.431

## 2019-07-09 ENCOUNTER — Telehealth: Payer: Self-pay | Admitting: *Deleted

## 2019-07-09 NOTE — Telephone Encounter (Signed)
   Primary Cardiologist: Jenne Campus, MD  Chart reviewed as part of pre-operative protocol coverage. Patient was contacted 07/09/2019 in reference to pre-operative risk assessment for pending surgery as outlined below.  Jaxxen Kortan was last seen on 05/12/19 by Dr. Agustin Cree.   Pt had STEMI requiring DES placed to LCx 03/25/2019. He may not interrupt dual antiplatelet therapy with ASA and plavix for at least 6 months, ideally 12 months.    I will route this recommendation to the requesting party via Epic fax function and remove from pre-op pool.  Please call with questions.  Tami Lin Bertina Guthridge, PA 07/09/2019, 1:57 PM

## 2019-07-09 NOTE — Telephone Encounter (Signed)
I s/w [name] surgery scheduler. We discussed that the pt will need to postpone his surgery as due to STEMI 03/25/19 which required DES at that time. Per cardiologist cannot interrupt dual antiplatelet therapy w/ASA and Plavix for at least 6 months preferably 12 months. Claiborne Billings states she saw our notes sent over to their office. Claiborne Billings states surgery is elective. Claiborne Billings assures me she will reach out to the pt with the new plan of care. I will send updated noted to [facility] for pt's chart there at [facility]. I will remove from the pre op call back pool.

## 2019-07-09 NOTE — Telephone Encounter (Signed)
   [facility] Pre-operative Risk Assessment    Request for surgical clearance:  1. What type of surgery is being performed? Rt CTR/RT hand mass excision/MAC   2. When is this surgery scheduled? TBD  3. What type of clearance is required (medical clearance vs. Pharmacy clearance to hold med vs. Both)? Medical & Pharmacy  4. Are there any medications that need to be held prior to surgery and how long?Aspirin and Plavix   5. Practice name and name of physician performing surgery? [facility], Dr. Harlin Dunn   6. What is your office phone number [phone]    7.   What is your office fax number [phone], Attn Phillip Dunn   8.   Anesthesia type (None, local, MAC, general) ? Not specified.   Phillip Dunn 07/09/2019, 10:56 AM  _________________________________________________________________   (provider comments below)

## 2019-07-10 ENCOUNTER — Telehealth: Payer: Self-pay

## 2019-07-10 NOTE — Telephone Encounter (Signed)
   [facility] Pre-operative Risk Assessment    Request for surgical clearance:  1. What type of surgery is being performed? Rt CTR/Rt hand mass excision/MAC  2. When is this surgery scheduled?  09-29-2019   3. What type of clearance is required (medical clearance vs. Pharmacy clearance to hold med vs. Both)? Medical/Pharmacy  4. Are there any medications that need to be held prior to surgery and how long? Please provide Aspirin and Plavix instructions.   5. Practice name and name of physician performing surgery? [facility]. Dr. Harlin Heys   6. What is your office phone number [phone]    7.   What is your office fax number [phone]  8.   Anesthesia type (None, local, MAC, general) ? Unspecified   Gita Kudo 07/10/2019, 9:37 AM  _________________________________________________________________   (provider comments below)

## 2019-07-10 NOTE — Telephone Encounter (Signed)
I will resend the notes from yesterday to [facility] surgery scheduler.

## 2019-07-10 NOTE — Telephone Encounter (Signed)
I am not sure why we received another clearance request. See notes from yesterday 07/09/19 as surgeon office was advised pt will need to postpone surgery at least until sometime after 09/23/19 as this will be 6 months from PCI. See yesterday's notes on clearance request.

## 2019-07-18 ENCOUNTER — Other Ambulatory Visit: Payer: Self-pay | Admitting: Cardiology

## 2019-07-21 DIAGNOSIS — G4733 Obstructive sleep apnea (adult) (pediatric): Secondary | ICD-10-CM | POA: Diagnosis not present

## 2019-07-21 DIAGNOSIS — J4540 Moderate persistent asthma, uncomplicated: Secondary | ICD-10-CM | POA: Diagnosis not present

## 2019-07-21 DIAGNOSIS — J301 Allergic rhinitis due to pollen: Secondary | ICD-10-CM | POA: Diagnosis not present

## 2019-07-21 DIAGNOSIS — I509 Heart failure, unspecified: Secondary | ICD-10-CM | POA: Diagnosis not present

## 2019-07-21 DIAGNOSIS — R0982 Postnasal drip: Secondary | ICD-10-CM | POA: Diagnosis not present

## 2019-08-04 ENCOUNTER — Encounter: Payer: Self-pay | Admitting: Cardiology

## 2019-08-04 ENCOUNTER — Ambulatory Visit (INDEPENDENT_AMBULATORY_CARE_PROVIDER_SITE_OTHER): Payer: Medicare Other | Admitting: Cardiology

## 2019-08-04 ENCOUNTER — Other Ambulatory Visit: Payer: Self-pay

## 2019-08-04 VITALS — BP 124/72 | HR 65

## 2019-08-04 DIAGNOSIS — E785 Hyperlipidemia, unspecified: Secondary | ICD-10-CM

## 2019-08-04 DIAGNOSIS — I10 Essential (primary) hypertension: Secondary | ICD-10-CM

## 2019-08-04 DIAGNOSIS — I2541 Coronary artery aneurysm: Secondary | ICD-10-CM | POA: Diagnosis not present

## 2019-08-04 DIAGNOSIS — I2510 Atherosclerotic heart disease of native coronary artery without angina pectoris: Secondary | ICD-10-CM | POA: Diagnosis not present

## 2019-08-04 LAB — LIPID PANEL
Chol/HDL Ratio: 2.6 ratio (ref 0.0–5.0)
Cholesterol, Total: 98 mg/dL — ABNORMAL LOW (ref 100–199)
HDL: 37 mg/dL — ABNORMAL LOW (ref >39)
LDL Chol Calc (NIH): 46 mg/dL (ref 0–99)
Triglycerides: 69 mg/dL (ref 0–149)
VLDL Cholesterol Cal: 15 mg/dL (ref 5–40)

## 2019-08-04 NOTE — Patient Instructions (Signed)
Medication Instructions:  Your physician recommends that you continue on your current medications as directed. Please refer to the Current Medication list given to you today.  *If you need a refill on your cardiac medications before your next appointment, please call your pharmacy*   Lab Work: Your physician recommends that you return for lab work today: Lipids   If you have labs (blood work) drawn today and your tests are completely normal, you will receive your results only by: Marland Kitchen MyChart Message (if you have MyChart) OR . A paper copy in the mail If you have any lab test that is abnormal or we need to change your treatment, we will call you to review the results.   Testing/Procedures: None.    Follow-Up: At [facility], you and your health needs are our priority.  As part of our continuing mission to provide you with exceptional heart care, we have created designated Provider Care Teams.  These Care Teams include your primary Cardiologist (physician) and Advanced Practice Providers (APPs -  Physician Assistants and Nurse Practitioners) who all work together to provide you with the care you need, when you need it.  We recommend signing up for the patient portal called "MyChart".  Sign up information is provided on this After Visit Summary.  MyChart is used to connect with patients for Virtual Visits (Telemedicine).  Patients are able to view lab/test results, encounter notes, upcoming appointments, etc.  Non-urgent messages can be sent to your provider as well.   To learn more about what you can do with MyChart, go to [email or web address].    Your next appointment:   5 month(s)  The format for your next appointment:   In Person  Provider:   Jenne Campus, MD   Other Instructions

## 2019-08-04 NOTE — Progress Notes (Signed)
Cardiology Office Note:    Date:  08/04/2019   ID:  Phillip Dunn, DOB 27-Jul-1950, MRN [id number]  PCP:  Renaldo Reel, PA  Cardiologist:  Jenne Campus, MD    Referring MD: Renaldo Reel, PA   No chief complaint on file. I am doing well carpal tunnel syndrome based upon her being a lot  History of Present Illness:    Phillip Dunn is a 69 y.o. male past medical history significant for coronary artery disease, status post coronary artery bypass graft, in January he ended up having non-STEMI secondary to withdrawal of his dual antiplatelet therapy for some back injection.  Cardiac catheterization was done at that time he was found to have total occlusion of first obtuse marginal branch, we were unsuccessful to cross the lesion.  Therefore, he was managed medically.  Since that time he is doing well.  Denies have any chest pain tightness squeezing pressure burning chest.  Still working hard yesterday he was laying some stones steps, he also was able to have 3 acres of grass using right arm over.  Denies having any chest pain tightness squeezing pressure burning chest.  Past Medical History:  Diagnosis Date  . 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 Asthma   . Benign hypertensive heart disease with CHF (congestive heart failure) ([place])   . Bradycardia   . COPD (chronic obstructive pulmonary disease) ([place])   . Coronary artery disease   . Diabetes mellitus without complication ([place])   . Hepatitis   . Hyperlipidemia   . Myocardial infarct ([place])   . OSA (obstructive sleep apnea)   . Peptic ulcer   . Primary insomnia   . Secondary hypotension   . Venous insufficiency of left lower extremity     Past Surgical History:  Procedure Laterality Date  . CARDIAC CATHETERIZATION    . CARPAL TUNNEL RELEASE Left   . CATARACT EXTRACTION, BILATERAL    . CORONARY ANGIOPLASTY    . CORONARY ARTERY BYPASS GRAFT      Current Medications: No outpatient medications have been marked as taking for the  08/04/19 encounter (Appointment) with [place] Liter, MD.     Allergies:   Pollen extract   Social History   Socioeconomic History  . Marital status: Married    Spouse name: Not on file  . Number of children: Not on file  . Years of education: Not on file  . Highest education level: Not on file  Occupational History  . Not on file  Tobacco Use  . Smoking status: Former Smoker    Types: Cigarettes  . Smokeless tobacco: Never Used  Substance and Sexual Activity  . Alcohol use: Yes    Alcohol/week: 2.0 - 3.0 standard drinks    Types: 2 - 3 Glasses of wine per week    Comment: daily  . Drug use: No  . Sexual activity: Not on file  Other Topics Concern  . Not on file  Social History Narrative  . Not on file   Social Determinants of Health   Financial Resource Strain:   . Difficulty of Paying Living Expenses:   Food Insecurity:   . Worried About Charity fundraiser in the Last Year:   . Arboriculturist in the Last Year:   Transportation Needs:   . Film/video editor (Medical):   Marland Kitchen Lack of Transportation (Non-Medical):   Physical Activity:   . Days of Exercise per Week:   . Minutes of Exercise per Session:  Stress:   . Feeling of Stress :   Social Connections:   . Frequency of Communication with Friends and Family:   . Frequency of Social Gatherings with Friends and Family:   . Attends Religious Services:   . Active Member of Clubs or Organizations:   . Attends Archivist Meetings:   Marland Kitchen Marital Status:      Family History: The patient's family history includes Stroke in his mother. ROS:   Please see the history of present illness.    All 14 point review of systems negative except as described per history of present illness  EKGs/Labs/Other Studies Reviewed:    Diagnostic Summary multivessel CAD acute OM1 occlusion STEMI case known CTO of RCA and SVG to PDA known patent LIMA to LAD graft mild LV dysfunction hemodynamically  stable Interventional Summary Unsuccessful PCI of OM1 due to thrombus burden within the known OM1 aneurysmal segment. guidewire would not fully traverse the occluded segment which thrombosed after brief interruption of DAPT 5 years after prior OM intervention (VLST). see prior CCTA report. patient symptom free with improved ST changes and stable hemodynamics, accordingly attempt ended. will treat medically  Recent Labs: 08/21/2018: BUN 14; Creatinine, Ser 0.91; NT-Pro BNP 653; Potassium 4.9; Sodium 140  Recent Lipid Panel    Component Value Date/Time   CHOL 102 04/15/2019 0929   TRIG 72 04/15/2019 0929   HDL 38 (L) 04/15/2019 0929   CHOLHDL 2.7 04/15/2019 0929   LDLCALC 49 04/15/2019 0929    Physical Exam:    VS:  There were no vitals taken for this visit.    Wt Readings from Last 3 Encounters:  05/12/19 264 lb (119.7 kg)  04/15/19 274 lb (124.3 kg)  01/24/19 267 lb (121.1 kg)     GEN:  Well nourished, well developed in no acute distress HEENT: Normal NECK: No JVD; No carotid bruits LYMPHATICS: No lymphadenopathy CARDIAC: RRR, no murmurs, no rubs, no gallops RESPIRATORY:  Clear to auscultation without rales, wheezing or rhonchi  ABDOMEN: Soft, non-tender, non-distended MUSCULOSKELETAL:  No edema; No deformity  SKIN: Warm and dry LOWER EXTREMITIES: no swelling NEUROLOGIC:  Alert and oriented x 3 PSYCHIATRIC:  Normal affect   ASSESSMENT:    1. Coronary artery disease involving native coronary artery of native heart without angina pectoris   2. Essential hypertension   3. Dyslipidemia   4. Coronary artery aneurysm    PLAN:    1. Coronary artery disease, status post coronary artery bypass graft years ago, status post recent non-STEMI which involved complete occlusion of the obtuse marginal branch.  However PTCA/stenting was unsuccessful.  Time of my last visit with him I did not have official report of cardiac catheterization.  My impression was that he did have a  stent placed however it looks like it was not the case.  It looks like to me flow after intervention was 1.  Decision has been made to pursue medical therapy which we doing.  Obviously now with this clinical scenario need to be on dual antiplatelet therapy and vascular continued a complicated situation along with the fact that he required carpal tunnel syndrome surgery on the right wrist and now because of recent MI as well as dual antiplatelet therapy surgery need to be postponed for at least 6 months probably 1 year. Essential hypertension blood pressure seems to be well controlled continue present management. 3.  Dyslipidemia: We will check his fasting profile today.  He is already on high intensity statin.  He  is a very high risk for recurrent events.  Therefore, he need to be on maximally tolerated statin which he is already on it.  Any target LDL should be less than 70 for him. 4.  Coronary artery aneurysm.  Noted.  Dual antiplatelet therapy.  Finally I had an access to his cardiac catheterization from January.  Medication Adjustments/Labs and Tests Ordered: Current medicines are reviewed at length with the patient today.  Concerns regarding medicines are outlined above.  No orders of the defined types were placed in this encounter.  Medication changes: No orders of the defined types were placed in this encounter.   Signed, [place] Liter, MD, [facility]   Coronary disease status post recent non-STEMI which involved complete occlusion of the left [facility] branch5/06/2019 8:08 AM    [facility]

## 2019-09-18 ENCOUNTER — Telehealth: Payer: Self-pay | Admitting: Cardiology

## 2019-09-18 NOTE — Telephone Encounter (Signed)
Called patient. He is going to find out what kind of anesthesia and let us know. His main question is if that facility is appropriate for him to have the procedure done at.

## 2019-09-18 NOTE — Telephone Encounter (Signed)
Please try to find out more about his hand surgery the question will be is done under general, spinal or regional anesthesia.

## 2019-09-18 NOTE — Telephone Encounter (Signed)
Patient is calling in regards to hand surgery he will have. He states he has been referred to the [facility]. However, prior to scheduling he would like to advise with Dr. Agustin Cree regarding whether he should schedule with the [facility] or another office. Please call to discuss.

## 2019-09-19 NOTE — Telephone Encounter (Signed)
Patient is calling to follow up in regards to hand surgery inquiry. Please call.

## 2019-09-19 NOTE — Telephone Encounter (Signed)
If is done under his regional or local anesthesia that should be no problem however final culture was required he may need to be evaluated before surgery.

## 2019-09-19 NOTE — Telephone Encounter (Signed)
Attempted to call back and number was busy will continue efforts.

## 2019-09-22 NOTE — Telephone Encounter (Signed)
Patient is returning call.  °

## 2019-09-22 NOTE — Telephone Encounter (Signed)
Called and spoke to patient. He reports it will be mild to moderate sedation with a local anesthetic. He also wants to make sure it is appropriate for him to have this done ins the [facility] instead of a hospital. Will consult with Dr. Agustin Cree.

## 2019-09-23 NOTE — Telephone Encounter (Signed)
Called patient informed him that he advised it was ok to proceed. Patient verbally understood no further questions.

## 2019-09-23 NOTE — Telephone Encounter (Signed)
If it is done under sedation with local anesthesia should be no problem to proceed.

## 2019-10-02 ENCOUNTER — Other Ambulatory Visit: Payer: Self-pay | Admitting: Cardiology

## 2019-10-05 ENCOUNTER — Other Ambulatory Visit: Payer: Self-pay | Admitting: Cardiology

## 2019-10-14 DIAGNOSIS — J4540 Moderate persistent asthma, uncomplicated: Secondary | ICD-10-CM | POA: Diagnosis not present

## 2019-10-14 DIAGNOSIS — Z4789 Encounter for other orthopedic aftercare: Secondary | ICD-10-CM | POA: Diagnosis not present

## 2019-10-14 DIAGNOSIS — M25641 Stiffness of right hand, not elsewhere classified: Secondary | ICD-10-CM | POA: Diagnosis not present

## 2019-10-14 DIAGNOSIS — I509 Heart failure, unspecified: Secondary | ICD-10-CM | POA: Diagnosis not present

## 2019-10-14 DIAGNOSIS — G4733 Obstructive sleep apnea (adult) (pediatric): Secondary | ICD-10-CM | POA: Diagnosis not present

## 2019-10-14 DIAGNOSIS — R0982 Postnasal drip: Secondary | ICD-10-CM | POA: Diagnosis not present

## 2019-10-14 DIAGNOSIS — M25631 Stiffness of right wrist, not elsewhere classified: Secondary | ICD-10-CM | POA: Diagnosis not present

## 2019-10-14 DIAGNOSIS — G5601 Carpal tunnel syndrome, right upper limb: Secondary | ICD-10-CM | POA: Diagnosis not present

## 2019-10-14 DIAGNOSIS — M67431 Ganglion, right wrist: Secondary | ICD-10-CM | POA: Diagnosis not present

## 2019-10-14 DIAGNOSIS — M65331 Trigger finger, right middle finger: Secondary | ICD-10-CM | POA: Insufficient documentation

## 2019-10-14 DIAGNOSIS — J301 Allergic rhinitis due to pollen: Secondary | ICD-10-CM | POA: Diagnosis not present

## 2019-10-14 DIAGNOSIS — M65341 Trigger finger, right ring finger: Secondary | ICD-10-CM | POA: Diagnosis not present

## 2019-10-14 HISTORY — DX: Trigger finger, right middle finger: M65.331

## 2019-11-05 DIAGNOSIS — I714 Abdominal aortic aneurysm, without rupture: Secondary | ICD-10-CM | POA: Diagnosis not present

## 2019-11-05 DIAGNOSIS — I10 Essential (primary) hypertension: Secondary | ICD-10-CM | POA: Diagnosis not present

## 2019-11-05 DIAGNOSIS — E785 Hyperlipidemia, unspecified: Secondary | ICD-10-CM | POA: Diagnosis not present

## 2019-11-05 DIAGNOSIS — I2510 Atherosclerotic heart disease of native coronary artery without angina pectoris: Secondary | ICD-10-CM | POA: Diagnosis not present

## 2019-11-05 DIAGNOSIS — Z79899 Other long term (current) drug therapy: Secondary | ICD-10-CM | POA: Diagnosis not present

## 2019-11-05 DIAGNOSIS — J449 Chronic obstructive pulmonary disease, unspecified: Secondary | ICD-10-CM | POA: Diagnosis not present

## 2019-11-05 DIAGNOSIS — R739 Hyperglycemia, unspecified: Secondary | ICD-10-CM | POA: Diagnosis not present

## 2019-11-05 DIAGNOSIS — G4700 Insomnia, unspecified: Secondary | ICD-10-CM | POA: Diagnosis not present

## 2019-11-05 DIAGNOSIS — Z6841 Body Mass Index (BMI) 40.0 and over, adult: Secondary | ICD-10-CM | POA: Diagnosis not present

## 2019-11-05 DIAGNOSIS — Z125 Encounter for screening for malignant neoplasm of prostate: Secondary | ICD-10-CM | POA: Diagnosis not present

## 2019-11-14 DIAGNOSIS — G4733 Obstructive sleep apnea (adult) (pediatric): Secondary | ICD-10-CM | POA: Diagnosis not present

## 2019-11-14 DIAGNOSIS — J343 Hypertrophy of nasal turbinates: Secondary | ICD-10-CM | POA: Diagnosis not present

## 2019-11-14 DIAGNOSIS — J342 Deviated nasal septum: Secondary | ICD-10-CM | POA: Diagnosis not present

## 2019-11-14 DIAGNOSIS — Z9989 Dependence on other enabling machines and devices: Secondary | ICD-10-CM | POA: Diagnosis not present

## 2019-11-14 DIAGNOSIS — H9190 Unspecified hearing loss, unspecified ear: Secondary | ICD-10-CM | POA: Diagnosis not present

## 2019-11-14 DIAGNOSIS — Z87898 Personal history of other specified conditions: Secondary | ICD-10-CM | POA: Diagnosis not present

## 2019-11-14 DIAGNOSIS — R0981 Nasal congestion: Secondary | ICD-10-CM | POA: Diagnosis not present

## 2019-11-14 DIAGNOSIS — J449 Chronic obstructive pulmonary disease, unspecified: Secondary | ICD-10-CM | POA: Diagnosis not present

## 2019-11-14 DIAGNOSIS — H6123 Impacted cerumen, bilateral: Secondary | ICD-10-CM | POA: Diagnosis not present

## 2019-11-17 ENCOUNTER — Telehealth: Payer: Self-pay | Admitting: Cardiology

## 2019-11-17 NOTE — Telephone Encounter (Signed)
     Pt calling to follow up if we receive blood work from his pcp. Advised we haven't receive it yet

## 2019-11-18 DIAGNOSIS — E875 Hyperkalemia: Secondary | ICD-10-CM | POA: Diagnosis not present

## 2019-12-23 DIAGNOSIS — E785 Hyperlipidemia, unspecified: Secondary | ICD-10-CM | POA: Diagnosis not present

## 2019-12-23 DIAGNOSIS — Z9181 History of falling: Secondary | ICD-10-CM | POA: Diagnosis not present

## 2019-12-23 DIAGNOSIS — Z1331 Encounter for screening for depression: Secondary | ICD-10-CM | POA: Diagnosis not present

## 2019-12-23 DIAGNOSIS — Z139 Encounter for screening, unspecified: Secondary | ICD-10-CM | POA: Diagnosis not present

## 2019-12-23 DIAGNOSIS — Z0000 Encounter for general adult medical examination without abnormal findings: Secondary | ICD-10-CM | POA: Diagnosis not present

## 2019-12-23 DIAGNOSIS — E669 Obesity, unspecified: Secondary | ICD-10-CM | POA: Diagnosis not present

## 2020-01-13 DIAGNOSIS — R0982 Postnasal drip: Secondary | ICD-10-CM | POA: Diagnosis not present

## 2020-01-13 DIAGNOSIS — I509 Heart failure, unspecified: Secondary | ICD-10-CM | POA: Diagnosis not present

## 2020-01-13 DIAGNOSIS — J301 Allergic rhinitis due to pollen: Secondary | ICD-10-CM | POA: Diagnosis not present

## 2020-01-13 DIAGNOSIS — G4733 Obstructive sleep apnea (adult) (pediatric): Secondary | ICD-10-CM | POA: Diagnosis not present

## 2020-01-13 DIAGNOSIS — J4540 Moderate persistent asthma, uncomplicated: Secondary | ICD-10-CM | POA: Diagnosis not present

## 2020-03-10 DIAGNOSIS — I10 Essential (primary) hypertension: Secondary | ICD-10-CM | POA: Diagnosis not present

## 2020-03-10 DIAGNOSIS — J449 Chronic obstructive pulmonary disease, unspecified: Secondary | ICD-10-CM | POA: Diagnosis not present

## 2020-03-10 DIAGNOSIS — Z6841 Body Mass Index (BMI) 40.0 and over, adult: Secondary | ICD-10-CM | POA: Diagnosis not present

## 2020-03-10 DIAGNOSIS — I2510 Atherosclerotic heart disease of native coronary artery without angina pectoris: Secondary | ICD-10-CM | POA: Diagnosis not present

## 2020-03-10 DIAGNOSIS — I714 Abdominal aortic aneurysm, without rupture: Secondary | ICD-10-CM | POA: Diagnosis not present

## 2020-03-10 DIAGNOSIS — E785 Hyperlipidemia, unspecified: Secondary | ICD-10-CM | POA: Diagnosis not present

## 2020-03-10 DIAGNOSIS — Z2821 Immunization not carried out because of patient refusal: Secondary | ICD-10-CM | POA: Diagnosis not present

## 2020-03-10 DIAGNOSIS — G4700 Insomnia, unspecified: Secondary | ICD-10-CM | POA: Diagnosis not present

## 2020-03-10 DIAGNOSIS — R739 Hyperglycemia, unspecified: Secondary | ICD-10-CM | POA: Diagnosis not present

## 2020-03-25 DIAGNOSIS — E875 Hyperkalemia: Secondary | ICD-10-CM | POA: Diagnosis not present

## 2020-04-14 DIAGNOSIS — J301 Allergic rhinitis due to pollen: Secondary | ICD-10-CM | POA: Diagnosis not present

## 2020-04-14 DIAGNOSIS — J4540 Moderate persistent asthma, uncomplicated: Secondary | ICD-10-CM | POA: Diagnosis not present

## 2020-04-14 DIAGNOSIS — I509 Heart failure, unspecified: Secondary | ICD-10-CM | POA: Diagnosis not present

## 2020-04-14 DIAGNOSIS — G4733 Obstructive sleep apnea (adult) (pediatric): Secondary | ICD-10-CM | POA: Diagnosis not present

## 2020-04-14 DIAGNOSIS — R0982 Postnasal drip: Secondary | ICD-10-CM | POA: Diagnosis not present

## 2020-04-28 ENCOUNTER — Other Ambulatory Visit: Payer: Self-pay

## 2020-04-28 DIAGNOSIS — I2510 Atherosclerotic heart disease of native coronary artery without angina pectoris: Secondary | ICD-10-CM | POA: Insufficient documentation

## 2020-04-28 DIAGNOSIS — K279 Peptic ulcer, site unspecified, unspecified as acute or chronic, without hemorrhage or perforation: Secondary | ICD-10-CM | POA: Insufficient documentation

## 2020-04-28 DIAGNOSIS — J449 Chronic obstructive pulmonary disease, unspecified: Secondary | ICD-10-CM | POA: Insufficient documentation

## 2020-04-28 DIAGNOSIS — E785 Hyperlipidemia, unspecified: Secondary | ICD-10-CM | POA: Insufficient documentation

## 2020-04-28 DIAGNOSIS — I509 Heart failure, unspecified: Secondary | ICD-10-CM | POA: Insufficient documentation

## 2020-04-28 DIAGNOSIS — I872 Venous insufficiency (chronic) (peripheral): Secondary | ICD-10-CM | POA: Insufficient documentation

## 2020-04-28 DIAGNOSIS — F5101 Primary insomnia: Secondary | ICD-10-CM | POA: Insufficient documentation

## 2020-04-28 DIAGNOSIS — I219 Acute myocardial infarction, unspecified: Secondary | ICD-10-CM | POA: Insufficient documentation

## 2020-04-28 DIAGNOSIS — I110 Hypertensive heart disease with heart failure: Secondary | ICD-10-CM | POA: Insufficient documentation

## 2020-04-28 DIAGNOSIS — R001 Bradycardia, unspecified: Secondary | ICD-10-CM | POA: Insufficient documentation

## 2020-04-28 DIAGNOSIS — E279 Disorder of adrenal gland, unspecified: Secondary | ICD-10-CM | POA: Insufficient documentation

## 2020-04-28 DIAGNOSIS — I9589 Other hypotension: Secondary | ICD-10-CM | POA: Insufficient documentation

## 2020-04-28 DIAGNOSIS — G4733 Obstructive sleep apnea (adult) (pediatric): Secondary | ICD-10-CM | POA: Insufficient documentation

## 2020-04-28 DIAGNOSIS — K759 Inflammatory liver disease, unspecified: Secondary | ICD-10-CM | POA: Insufficient documentation

## 2020-04-28 DIAGNOSIS — J45909 Unspecified asthma, uncomplicated: Secondary | ICD-10-CM | POA: Insufficient documentation

## 2020-04-28 DIAGNOSIS — E119 Type 2 diabetes mellitus without complications: Secondary | ICD-10-CM | POA: Insufficient documentation

## 2020-04-30 ENCOUNTER — Ambulatory Visit (INDEPENDENT_AMBULATORY_CARE_PROVIDER_SITE_OTHER): Payer: Medicare Other | Admitting: Cardiology

## 2020-04-30 ENCOUNTER — Encounter: Payer: Self-pay | Admitting: Cardiology

## 2020-04-30 ENCOUNTER — Other Ambulatory Visit: Payer: Self-pay

## 2020-04-30 VITALS — BP 110/70 | HR 64 | Ht 67.0 in | Wt 252.0 lb

## 2020-04-30 DIAGNOSIS — I2541 Coronary artery aneurysm: Secondary | ICD-10-CM | POA: Diagnosis not present

## 2020-04-30 DIAGNOSIS — F5101 Primary insomnia: Secondary | ICD-10-CM

## 2020-04-30 DIAGNOSIS — E785 Hyperlipidemia, unspecified: Secondary | ICD-10-CM | POA: Diagnosis not present

## 2020-04-30 DIAGNOSIS — I2510 Atherosclerotic heart disease of native coronary artery without angina pectoris: Secondary | ICD-10-CM | POA: Diagnosis not present

## 2020-04-30 DIAGNOSIS — J410 Simple chronic bronchitis: Secondary | ICD-10-CM | POA: Diagnosis not present

## 2020-04-30 DIAGNOSIS — I10 Essential (primary) hypertension: Secondary | ICD-10-CM

## 2020-04-30 DIAGNOSIS — R0600 Dyspnea, unspecified: Secondary | ICD-10-CM

## 2020-04-30 NOTE — Patient Instructions (Signed)
Medication Instructions:  Your physician recommends that you continue on your current medications as directed. Please refer to the Current Medication list given to you today.  *If you need a refill on your cardiac medications before your next appointment, please call your pharmacy*   Lab Work: NONE  If you have labs (blood work) drawn today and your tests are completely normal, you will receive your results only by: Marland Kitchen MyChart Message (if you have MyChart) OR . A paper copy in the mail If you have any lab test that is abnormal or we need to change your treatment, we will call you to review the results.   Testing/Procedures: Your physician has requested that you have an echocardiogram. Echocardiography is a painless test that uses sound waves to create images of your heart. It provides your doctor with information about the size and shape of your heart and how well your heart's chambers and valves are working. This procedure takes approximately one hour. There are no restrictions for this procedure.     Follow-Up: At [facility], you and your health needs are our priority.  As part of our continuing mission to provide you with exceptional heart care, we have created designated Provider Care Teams.  These Care Teams include your primary Cardiologist (physician) and Advanced Practice Providers (APPs -  Physician Assistants and Nurse Practitioners) who all work together to provide you with the care you need, when you need it.  We recommend signing up for the patient portal called "MyChart".  Sign up information is provided on this After Visit Summary.  MyChart is used to connect with patients for Virtual Visits (Telemedicine).  Patients are able to view lab/test results, encounter notes, upcoming appointments, etc.  Non-urgent messages can be sent to your provider as well.   To learn more about what you can do with MyChart, go to [email or web address].    Your next appointment:   3  month(s)  The format for your next appointment:   In Person  Provider:   Jenne Campus, MD   Other Instructions  Echocardiogram An echocardiogram is a test that uses sound waves (ultrasound) to produce images of the heart. Images from an echocardiogram can provide important information about:  Heart size and shape.  The size and thickness and movement of your heart's walls.  Heart muscle function and strength.  Heart valve function or if you have stenosis. Stenosis is when the heart valves are too narrow.  If blood is flowing backward through the heart valves (regurgitation).  A tumor or infectious growth around the heart valves.  Areas of heart muscle that are not working well because of poor blood flow or injury from a heart attack.  Aneurysm detection. An aneurysm is a weak or damaged part of an artery wall. The wall bulges out from the normal force of blood pumping through the body. Tell a health care provider about:  Any allergies you have.  All medicines you are taking, including vitamins, herbs, eye drops, creams, and over-the-counter medicines.  Any blood disorders you have.  Any surgeries you have had.  Any medical conditions you have.  Whether you are pregnant or may be pregnant. What are the risks? Generally, this is a safe test. However, problems may occur, including an allergic reaction to dye (contrast) that may be used during the test. What happens before the test? No specific preparation is needed. You may eat and drink normally. What happens during the test?  You will take off your  clothes from the waist up and put on a hospital gown.  Electrodes or electrocardiogram (ECG)patches may be placed on your chest. The electrodes or patches are then connected to a device that monitors your heart rate and rhythm.  You will lie down on a table for an ultrasound exam. A gel will be applied to your chest to help sound waves pass through your skin.  A  handheld device, called a transducer, will be pressed against your chest and moved over your heart. The transducer produces sound waves that travel to your heart and bounce back (or "echo" back) to the transducer. These sound waves will be captured in real-time and changed into images of your heart that can be viewed on a video monitor. The images will be recorded on a computer and reviewed by your health care provider.  You may be asked to change positions or hold your breath for a short time. This makes it easier to get different views or better views of your heart.  In some cases, you may receive contrast through an IV in one of your veins. This can improve the quality of the pictures from your heart. The procedure may vary among health care providers and hospitals.   What can I expect after the test? You may return to your normal, everyday life, including diet, activities, and medicines, unless your health care provider tells you not to do that. Follow these instructions at home:  It is up to you to get the results of your test. Ask your health care provider, or the department that is doing the test, when your results will be ready.  Keep all follow-up visits. This is important. Summary  An echocardiogram is a test that uses sound waves (ultrasound) to produce images of the heart.  Images from an echocardiogram can provide important information about the size and shape of your heart, heart muscle function, heart valve function, and other possible heart problems.  You do not need to do anything to prepare before this test. You may eat and drink normally.  After the echocardiogram is completed, you may return to your normal, everyday life, unless your health care provider tells you not to do that. This information is not intended to replace advice given to you by your health care provider. Make sure you discuss any questions you have with your health care provider. Document Revised:  11/11/2019 Document Reviewed: 11/11/2019 Elsevier Patient Education  2021 [employer]   

## 2020-04-30 NOTE — Progress Notes (Signed)
Cardiology Office Note:    Date:  04/30/2020   ID:  Phillip Dunn, DOB 02-Aug-1950, MRN [id number]  PCP:  Marlyn Corporal, PA  Cardiologist:  Gypsy Balsam, MD    Referring MD: Marlyn Corporal, [place]   Chief Complaint  Patient presents with  . Follow-up  I am doing better History of Present Illness:    Phillip Dunn is a 70 y.o. male   with past medical history significant for coronary artery disease status post coronary artery bypass graft years ago, in January 2020 when he ended up going to the hospital with non-STEMI which was probably triggered by the fact that his dual antiplatelet therapy was interrupted for back injection. Cardiac catheterization at that time find to have total occlusion of first obtuse marginal branch however opening up the lesion was unsuccessful as he was treated medically. He also got history of essential hypertension, dyslipidemia, diabetes, obstructive sleep apnea. Comes today 2 months of follow-up, he said he is weak tired exhausted also with extreme exercise/therefore he will get some tightness in the chest which is a chronic complaint. He said some medication has been changed for his lungs and he is sleeping better. Denies having swelling of lower extremity more than usual,   Past Medical History:  Diagnosis Date  . Adrenal disorder (HCC)   . Asthma   . Benign hypertensive heart disease with CHF (congestive heart failure) (HCC)   . Bradycardia   . COPD (chronic obstructive pulmonary disease) (HCC)   . Coronary artery disease   . Diabetes mellitus without complication (HCC)   . Hepatitis   . Hyperlipidemia   . Myocardial infarct (HCC)   . OSA (obstructive sleep apnea)   . Peptic ulcer   . Primary insomnia   . Secondary hypotension   . Venous insufficiency of left lower extremity     Past Surgical History:  Procedure Laterality Date  . CARDIAC CATHETERIZATION    . CARPAL TUNNEL RELEASE Left   . CATARACT EXTRACTION, BILATERAL    . CORONARY  ANGIOPLASTY    . CORONARY ARTERY BYPASS GRAFT      Current Medications: Current Meds  Medication Sig  . albuterol (VENTOLIN HFA) 108 (90 Base) MCG/ACT inhaler Inhale 2 puffs into the lungs every 4 (four) hours as needed for wheezing.  Marland Kitchen aspirin EC 81 MG tablet Take 81 mg by mouth daily.  Marland Kitchen atorvastatin (LIPITOR) 80 MG tablet TAKE 1 TABLET EVERY DAY  . ciclesonide (ALVESCO) 160 MCG/ACT inhaler Inhale 2 puffs into the lungs 2 (two) times daily.  . clopidogrel (PLAVIX) 75 MG tablet TAKE 1 TABLET EVERY DAY  . Coenzyme Q10 (COQ-10) 10 MG CAPS Take 10 mg by mouth daily.  . Cyanocobalamin (VITAMIN B 12) 500 MCG TABS Take 1 tablet by mouth daily.  . DOCOSAHEXAENOIC ACID PO Take 1 capsule by mouth daily.  . finasteride (PROSCAR) 5 MG tablet Take 1 tablet (5 mg total) by mouth daily.  . furosemide (LASIX) 40 MG tablet TAKE 3 TABLETS EVERY DAY  . ipratropium (ATROVENT) 0.03 % nasal spray Place 1 spray into both nostrils every 12 (twelve) hours.  Marland Kitchen lisinopril (ZESTRIL) 2.5 MG tablet TAKE 1 TABLET EVERY DAY  . montelukast (SINGULAIR) 10 MG tablet as needed.  . nitroGLYCERIN (NITROSTAT) 0.4 MG SL tablet Place 0.4 mg under the tongue every 5 (five) minutes as needed for chest pain.   . tamsulosin (FLOMAX) 0.4 MG CAPS capsule Take 0.4 mg by mouth daily.     Allergies:  Pollen extract   Social History   Socioeconomic History  . Marital status: Married    Spouse name: Not on file  . Number of children: Not on file  . Years of education: Not on file  . Highest education level: Not on file  Occupational History  . Not on file  Tobacco Use  . Smoking status: Former Smoker    Types: Cigarettes  . Smokeless tobacco: Never Used  Vaping Use  . Vaping Use: Former  Substance and Sexual Activity  . Alcohol use: Yes    Alcohol/week: 2.0 - 3.0 standard drinks    Types: 2 - 3 Glasses of wine per week    Comment: daily  . Drug use: No  . Sexual activity: Not on file  Other Topics Concern  . Not on  file  Social History Narrative  . Not on file   Social Determinants of Health   Financial Resource Strain: Not on file  Food Insecurity: Not on file  Transportation Needs: Not on file  Physical Activity: Not on file  Stress: Not on file  Social Connections: Not on file     Family History: The patient's family history includes Stroke in his mother. ROS:   Please see the history of present illness.    All 14 point review of systems negative except as described per history of present illness  EKGs/Labs/Other Studies Reviewed:      Recent Labs: No results found for requested labs within last 8760 hours.  Recent Lipid Panel    Component Value Date/Time   CHOL 98 (L) 08/04/2019 0835   TRIG 69 08/04/2019 0835   HDL 37 (L) 08/04/2019 0835   CHOLHDL 2.6 08/04/2019 0835   LDLCALC 46 08/04/2019 0835    Physical Exam:    VS:  BP 110/70 (BP Location: Left Arm, Patient Position: Sitting, Cuff Size: Large)   Pulse 64   Ht 5\' 7"  (1.702 m)   Wt 252 lb (114.3 kg)   SpO2 94%   BMI 39.47 kg/m     Wt Readings from Last 3 Encounters:  04/30/20 252 lb (114.3 kg)  05/12/19 264 lb (119.7 kg)  04/15/19 274 lb (124.3 kg)     GEN:  Well nourished, well developed in no acute distress HEENT: Normal NECK: No JVD; No carotid bruits LYMPHATICS: No lymphadenopathy CARDIAC: RRR, no murmurs, no rubs, no gallops RESPIRATORY:  Clear to auscultation without rales, wheezing or rhonchi  ABDOMEN: Soft, non-tender, non-distended MUSCULOSKELETAL:  No edema; No deformity  SKIN: Warm and dry LOWER EXTREMITIES: no swelling NEUROLOGIC:  Alert and oriented x 3 PSYCHIATRIC:  Normal affect   ASSESSMENT:    1. Coronary artery disease involving native coronary artery of native heart without angina pectoris   2. Essential hypertension   3. Coronary artery aneurysm   4. Dyspnea, unspecified type   5. Dyslipidemia   6. Primary insomnia   7. Simple chronic bronchitis (HCC)    PLAN:    In order of  problems listed above:  1. Coronary artery disease seems to have stable angina pectoris pattern. We did talk in length about what to do with the situation I told him that I see some role for additional medication I have proposed to start ranolazine 500 mg twice daily, however he does not want to do it. He said new medication for his legs make him feel so much better that he would like to give a little more time to see how much he can improve  his wellbeing and then if that does not work we will go on ranolazine. 2. Shortness of breath multifactorial. Fatigue tiredness also probably multifactorial I will schedule him to have an echocardiogram to reassess left ventricle ejection fraction. 3. Dyslipidemia I did review his K PN which show me his LDL of 59 HDL 41 this is from 03/10/2020 he is on high intense statin in form of Lipitor 80 which I will continue. Obviously we will continue dual platelet therapy probably indefinitely 4. Primary insomnia apparently changes in the medication were made which make him sleep better.   Medication Adjustments/Labs and Tests Ordered: Current medicines are reviewed at length with the patient today.  Concerns regarding medicines are outlined above.  Orders Placed This Encounter  Procedures  . EKG 12-Lead  . ECHOCARDIOGRAM COMPLETE   Medication changes: No orders of the defined types were placed in this encounter.   Signed, Park Liter, MD, [facility] 04/30/2020 8:55 AM    [place]

## 2020-05-08 ENCOUNTER — Other Ambulatory Visit: Payer: Self-pay | Admitting: Cardiology

## 2020-05-15 ENCOUNTER — Other Ambulatory Visit: Payer: Self-pay | Admitting: Cardiology

## 2020-05-17 NOTE — Telephone Encounter (Signed)
Rx refill sent to pharmacy. 

## 2020-05-27 ENCOUNTER — Ambulatory Visit (INDEPENDENT_AMBULATORY_CARE_PROVIDER_SITE_OTHER): Payer: Medicare Other

## 2020-05-27 ENCOUNTER — Other Ambulatory Visit: Payer: Self-pay

## 2020-05-27 DIAGNOSIS — R0600 Dyspnea, unspecified: Secondary | ICD-10-CM | POA: Diagnosis not present

## 2020-05-27 LAB — ECHOCARDIOGRAM COMPLETE
AR max vel: 1.33 cm2
AV Area VTI: 1.43 cm2
AV Area mean vel: 1.38 cm2
AV Mean grad: 9.7 mmHg
AV Peak grad: 18.8 mmHg
Ao pk vel: 2.17 m/s
Area-P 1/2: 3.68 cm2
Calc EF: 51.3 %
S' Lateral: 4 cm
Single Plane A2C EF: 55.9 %
Single Plane A4C EF: 47.1 %

## 2020-05-27 NOTE — Progress Notes (Signed)
Complete echocardiogram performed.  Jimmy Marcina Kinnison RDCS, RVT  

## 2020-05-29 ENCOUNTER — Other Ambulatory Visit: Payer: Self-pay | Admitting: Cardiology

## 2020-06-01 HISTORY — PX: OTHER SURGICAL HISTORY: SHX169

## 2020-06-15 DIAGNOSIS — R0982 Postnasal drip: Secondary | ICD-10-CM | POA: Diagnosis not present

## 2020-06-15 DIAGNOSIS — J4540 Moderate persistent asthma, uncomplicated: Secondary | ICD-10-CM | POA: Diagnosis not present

## 2020-06-15 DIAGNOSIS — I509 Heart failure, unspecified: Secondary | ICD-10-CM | POA: Diagnosis not present

## 2020-06-15 DIAGNOSIS — G4733 Obstructive sleep apnea (adult) (pediatric): Secondary | ICD-10-CM | POA: Diagnosis not present

## 2020-06-15 DIAGNOSIS — J301 Allergic rhinitis due to pollen: Secondary | ICD-10-CM | POA: Diagnosis not present

## 2020-08-03 ENCOUNTER — Other Ambulatory Visit: Payer: Self-pay

## 2020-08-09 ENCOUNTER — Ambulatory Visit (INDEPENDENT_AMBULATORY_CARE_PROVIDER_SITE_OTHER): Payer: Medicare Other | Admitting: Cardiology

## 2020-08-09 ENCOUNTER — Encounter: Payer: Self-pay | Admitting: Cardiology

## 2020-08-09 ENCOUNTER — Other Ambulatory Visit: Payer: Self-pay

## 2020-08-09 VITALS — BP 96/56 | HR 63 | Ht 67.0 in | Wt 252.0 lb

## 2020-08-09 DIAGNOSIS — I2510 Atherosclerotic heart disease of native coronary artery without angina pectoris: Secondary | ICD-10-CM

## 2020-08-09 DIAGNOSIS — I10 Essential (primary) hypertension: Secondary | ICD-10-CM | POA: Diagnosis not present

## 2020-08-09 DIAGNOSIS — I2541 Coronary artery aneurysm: Secondary | ICD-10-CM | POA: Diagnosis not present

## 2020-08-09 DIAGNOSIS — E782 Mixed hyperlipidemia: Secondary | ICD-10-CM

## 2020-08-09 NOTE — Progress Notes (Signed)
Cardiology Office Note:    Date:  08/09/2020   ID:  Phillip Dunn, DOB March 16, 1951, MRN [id number]  PCP:  Phillip Reel, PA  Cardiologist:  Phillip Campus, MD    Referring MD: Phillip Dunn, [place]   Chief Complaint  Patient presents with  . Follow-up  Am doing fine  History of Present Illness:    Phillip Dunn is a 70 y.o. male    with past medical history significant for coronary artery disease status post coronary artery bypass graft years ago, in January 2020 when he ended up going to the hospital with non-STEMI which was probably triggered by the fact that his dual antiplatelet therapy was interrupted for back injection. Cardiac catheterization at that time find to have total occlusion of first obtuse marginal branch however opening up the lesion was unsuccessful as he was treated medically. He also got history of essential hypertension, dyslipidemia, diabetes, obstructive sleep apnea. He comes today to my office s for follow-up.  Overall he seems to be doing well.  Denies have any chest pain tightness squeezing pressure burning chest.  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  Swelling of lower extremities still there but is a chronic problem there is some lymphedema that is as well.  Past Medical History:  Diagnosis Date  . Adrenal disorder ([place])   . Asthma   . Benign hypertensive heart disease with CHF (congestive heart failure) ([place])   . Bradycardia   . COPD (chronic obstructive pulmonary disease) ([place])   . 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 Coronary artery disease   . Coronary artery disease involving native coronary artery of native heart without angina pectoris 12/08/2014   Overview:  Multiple stents to circumflex artery in March 18, 2014  Formatting of this note might be different from the original. Multiple stents to circumflex artery in March 18, 2014  . Diabetes mellitus without  complication ([place])   . Dyslipidemia 12/08/2014  . Essential hypertension 03/03/2015  . Hepatitis   . Hyperlipidemia   . Myocardial infarct ([place])   . OSA (obstructive sleep apnea)   . Peptic ulcer   . Primary insomnia   . Secondary hypotension   . Sleep apnea 11/28/2016  . Venous insufficiency of left lower extremity     Past Surgical History:  Procedure Laterality Date  . CARDIAC CATHETERIZATION    . CARPAL TUNNEL RELEASE Left   . CATARACT EXTRACTION, BILATERAL    . CORONARY ANGIOPLASTY    . CORONARY ARTERY BYPASS GRAFT    . Right Hip replacement Right 06/2020    Current Medications: Current Meds  Medication Sig  . albuterol (VENTOLIN HFA) 108 (90 Base) MCG/ACT inhaler Inhale 2 puffs into the lungs every 4 (four) hours as needed for wheezing.  Marland Kitchen aspirin EC 81 MG tablet Take 81 mg by mouth daily.  Marland Kitchen atorvastatin (LIPITOR) 80 MG tablet TAKE 1 TABLET EVERY DAY (Patient taking differently: Take 80 mg by mouth daily.)  . ciclesonide (ALVESCO) 160 MCG/ACT inhaler Inhale 2 puffs into the lungs 2 (two) times daily.  . clopidogrel (PLAVIX) 75 MG tablet TAKE 1 TABLET EVERY DAY (Patient taking differently: Take 75 mg by mouth daily.)  . Coenzyme Q10 (COQ-10) 10 MG CAPS Take 10 mg by mouth daily.  . Cyanocobalamin (VITAMIN B 12) 500 MCG TABS Take 1 tablet by mouth daily.  . finasteride (PROSCAR) 5 MG tablet TAKE 1  TABLET EVERY DAY (Patient taking differently: Take 5 mg by mouth daily.)  . furosemide (LASIX) 40 MG tablet TAKE 3 TABLETS EVERY DAY (Patient taking differently: Take 60 mg by mouth daily.)  . ipratropium (ATROVENT) 0.03 % nasal spray Place 1 spray into both nostrils every 12 (twelve) hours.  Marland Kitchen lisinopril (ZESTRIL) 2.5 MG tablet TAKE 1 TABLET EVERY DAY (Patient taking differently: Take 2.5 mg by mouth daily.)  . montelukast (SINGULAIR) 10 MG tablet Take 10 mg by mouth daily after breakfast.  . nitroGLYCERIN (NITROSTAT) 0.4 MG SL tablet Place 0.4 mg under the tongue every 5 (five)  minutes as needed for chest pain.   . tamsulosin (FLOMAX) 0.4 MG CAPS capsule Take 0.4 mg by mouth daily.  . [DISCONTINUED] telmisartan (MICARDIS) 20 MG tablet Take 1 tablet by mouth daily.     Allergies:   Pollen extract   Social History   Socioeconomic History  . Marital status: Married    Spouse name: Not on file  . Number of children: Not on file  . Years of education: Not on file  . Highest education level: Not on file  Occupational History  . Not on file  Tobacco Use  . Smoking status: Former Smoker    Types: Cigarettes  . Smokeless tobacco: Never Used  Vaping Use  . Vaping Use: Former  Substance and Sexual Activity  . Alcohol use: Yes    Alcohol/week: 2.0 - 3.0 standard drinks    Types: 2 - 3 Glasses of wine per week    Comment: daily  . Drug use: No  . Sexual activity: Not on file  Other Topics Concern  . Not on file  Social History Narrative  . Not on file   Social Determinants of Health   Financial Resource Strain: Not on file  Food Insecurity: Not on file  Transportation Needs: Not on file  Physical Activity: Not on file  Stress: Not on file  Social Connections: Not on file     Family History: The patient's family history includes Stroke in his mother. ROS:   Please see the history of present illness.    All 14 point review of systems negative except as described per history of present illness  EKGs/Labs/Other Studies Reviewed:      Recent Labs: No results found for requested labs within last 8760 hours.  Recent Lipid Panel    Component Value Date/Time   CHOL 98 (L) 08/04/2019 0835   TRIG 69 08/04/2019 0835   HDL 37 (L) 08/04/2019 0835   CHOLHDL 2.6 08/04/2019 0835   LDLCALC 46 08/04/2019 0835    Physical Exam:    VS:  BP (!) 96/56 (BP Location: Left Arm, Patient Position: Sitting)   Pulse 63   Ht 5\' 7"  (1.702 m)   Wt 252 lb (114.3 kg)   SpO2 93%   BMI 39.47 kg/m     Wt Readings from Last 3 Encounters:  08/09/20 252 lb (114.3  kg)  04/30/20 252 lb (114.3 kg)  05/12/19 264 lb (119.7 kg)     GEN:  Well nourished, well developed in no acute distress HEENT: Normal NECK: No JVD; No carotid bruits LYMPHATICS: No lymphadenopathy CARDIAC: RRR, no murmurs, no rubs, no gallops RESPIRATORY:  Clear to auscultation without rales, wheezing or rhonchi  ABDOMEN: Soft, non-tender, non-distended MUSCULOSKELETAL:  No edema; No deformity  SKIN: Warm and dry LOWER EXTREMITIES: no swelling NEUROLOGIC:  Alert and oriented x 3 PSYCHIATRIC:  Normal affect   ASSESSMENT:  1. Coronary artery disease involving native coronary artery of native heart without angina pectoris   2. Essential hypertension   3. Mixed hyperlipidemia   4. Coronary artery aneurysm    PLAN:    In order of problems listed above:  1. Coronary artery disease stable from that point review.  On appropriate medication we will continue dual antiplatelet therapy because of history of aneurysm within his coronary arteries, also recent coronary event. 2. Essential hypertension blood pressure actually is on the lower side.  However all medication he takes appropriate will continue.  And he is doing well denies have any dizziness. 3. Mixed dyslipidemia, I did review his K PN which show me his LDL of 59 HDL 41 this is from December 2021. 4. Overall I think he is doing fairly well.  We will continue present management.  He does have completely occluded obtuse marginal branch but echocardiogram reviewed from last time showed preserved ejection fraction.  He does have diastolic dysfunction however.   Medication Adjustments/Labs and Tests Ordered: Current medicines are reviewed at length with the patient today.  Concerns regarding medicines are outlined above.  No orders of the defined types were placed in this encounter.  Medication changes: No orders of the defined types were placed in this encounter.   Signed, Park Liter, MD, [facility] 08/09/2020 9:50 AM     [place]

## 2020-08-09 NOTE — Patient Instructions (Signed)

## 2020-08-16 DIAGNOSIS — H6503 Acute serous otitis media, bilateral: Secondary | ICD-10-CM | POA: Diagnosis not present

## 2020-08-16 DIAGNOSIS — H6983 Other specified disorders of Eustachian tube, bilateral: Secondary | ICD-10-CM | POA: Diagnosis not present

## 2020-08-16 DIAGNOSIS — H9193 Unspecified hearing loss, bilateral: Secondary | ICD-10-CM | POA: Diagnosis not present

## 2020-08-16 DIAGNOSIS — J309 Allergic rhinitis, unspecified: Secondary | ICD-10-CM | POA: Diagnosis not present

## 2020-08-16 DIAGNOSIS — J342 Deviated nasal septum: Secondary | ICD-10-CM | POA: Diagnosis not present

## 2020-08-16 DIAGNOSIS — R0981 Nasal congestion: Secondary | ICD-10-CM | POA: Diagnosis not present

## 2020-08-16 DIAGNOSIS — J343 Hypertrophy of nasal turbinates: Secondary | ICD-10-CM | POA: Diagnosis not present

## 2020-08-16 DIAGNOSIS — R0600 Dyspnea, unspecified: Secondary | ICD-10-CM | POA: Diagnosis not present

## 2020-08-16 DIAGNOSIS — J302 Other seasonal allergic rhinitis: Secondary | ICD-10-CM | POA: Diagnosis not present

## 2020-08-31 DIAGNOSIS — H6983 Other specified disorders of Eustachian tube, bilateral: Secondary | ICD-10-CM | POA: Diagnosis not present

## 2020-08-31 DIAGNOSIS — H9190 Unspecified hearing loss, unspecified ear: Secondary | ICD-10-CM | POA: Diagnosis not present

## 2020-08-31 DIAGNOSIS — J342 Deviated nasal septum: Secondary | ICD-10-CM | POA: Diagnosis not present

## 2020-08-31 DIAGNOSIS — J343 Hypertrophy of nasal turbinates: Secondary | ICD-10-CM | POA: Diagnosis not present

## 2020-08-31 DIAGNOSIS — J3089 Other allergic rhinitis: Secondary | ICD-10-CM | POA: Diagnosis not present

## 2020-08-31 DIAGNOSIS — H6593 Unspecified nonsuppurative otitis media, bilateral: Secondary | ICD-10-CM | POA: Diagnosis not present

## 2020-09-03 DIAGNOSIS — H43812 Vitreous degeneration, left eye: Secondary | ICD-10-CM | POA: Diagnosis not present

## 2020-09-03 DIAGNOSIS — H11152 Pinguecula, left eye: Secondary | ICD-10-CM | POA: Diagnosis not present

## 2020-09-03 DIAGNOSIS — M5459 Other low back pain: Secondary | ICD-10-CM | POA: Diagnosis not present

## 2020-09-03 DIAGNOSIS — Z6841 Body Mass Index (BMI) 40.0 and over, adult: Secondary | ICD-10-CM | POA: Diagnosis not present

## 2020-09-09 DIAGNOSIS — M549 Dorsalgia, unspecified: Secondary | ICD-10-CM | POA: Diagnosis not present

## 2020-09-09 DIAGNOSIS — G4700 Insomnia, unspecified: Secondary | ICD-10-CM | POA: Diagnosis not present

## 2020-09-09 DIAGNOSIS — I2510 Atherosclerotic heart disease of native coronary artery without angina pectoris: Secondary | ICD-10-CM | POA: Diagnosis not present

## 2020-09-09 DIAGNOSIS — J449 Chronic obstructive pulmonary disease, unspecified: Secondary | ICD-10-CM | POA: Diagnosis not present

## 2020-09-09 DIAGNOSIS — R739 Hyperglycemia, unspecified: Secondary | ICD-10-CM | POA: Diagnosis not present

## 2020-09-09 DIAGNOSIS — S41119A Laceration without foreign body of unspecified upper arm, initial encounter: Secondary | ICD-10-CM | POA: Diagnosis not present

## 2020-09-09 DIAGNOSIS — I10 Essential (primary) hypertension: Secondary | ICD-10-CM | POA: Diagnosis not present

## 2020-09-09 DIAGNOSIS — I714 Abdominal aortic aneurysm, without rupture: Secondary | ICD-10-CM | POA: Diagnosis not present

## 2020-09-09 DIAGNOSIS — E559 Vitamin D deficiency, unspecified: Secondary | ICD-10-CM | POA: Diagnosis not present

## 2020-09-09 DIAGNOSIS — J302 Other seasonal allergic rhinitis: Secondary | ICD-10-CM | POA: Diagnosis not present

## 2020-09-09 DIAGNOSIS — E785 Hyperlipidemia, unspecified: Secondary | ICD-10-CM | POA: Diagnosis not present

## 2020-09-09 DIAGNOSIS — H6980 Other specified disorders of Eustachian tube, unspecified ear: Secondary | ICD-10-CM | POA: Diagnosis not present

## 2020-09-24 DIAGNOSIS — M5416 Radiculopathy, lumbar region: Secondary | ICD-10-CM | POA: Diagnosis not present

## 2020-10-01 DIAGNOSIS — M545 Low back pain, unspecified: Secondary | ICD-10-CM | POA: Diagnosis not present

## 2020-10-11 DIAGNOSIS — R7989 Other specified abnormal findings of blood chemistry: Secondary | ICD-10-CM | POA: Diagnosis not present

## 2020-10-11 DIAGNOSIS — I10 Essential (primary) hypertension: Secondary | ICD-10-CM | POA: Diagnosis not present

## 2020-10-14 DIAGNOSIS — E279 Disorder of adrenal gland, unspecified: Secondary | ICD-10-CM | POA: Diagnosis not present

## 2020-10-14 DIAGNOSIS — M549 Dorsalgia, unspecified: Secondary | ICD-10-CM | POA: Diagnosis not present

## 2020-10-14 DIAGNOSIS — D692 Other nonthrombocytopenic purpura: Secondary | ICD-10-CM | POA: Diagnosis not present

## 2020-10-25 DIAGNOSIS — M545 Low back pain, unspecified: Secondary | ICD-10-CM | POA: Diagnosis not present

## 2020-10-25 DIAGNOSIS — R52 Pain, unspecified: Secondary | ICD-10-CM | POA: Diagnosis not present

## 2020-10-25 DIAGNOSIS — M5136 Other intervertebral disc degeneration, lumbar region: Secondary | ICD-10-CM | POA: Diagnosis not present

## 2020-10-25 DIAGNOSIS — R0600 Dyspnea, unspecified: Secondary | ICD-10-CM | POA: Diagnosis not present

## 2020-10-25 DIAGNOSIS — M549 Dorsalgia, unspecified: Secondary | ICD-10-CM | POA: Diagnosis not present

## 2020-10-25 DIAGNOSIS — R0902 Hypoxemia: Secondary | ICD-10-CM | POA: Diagnosis not present

## 2020-11-04 ENCOUNTER — Other Ambulatory Visit: Payer: Self-pay | Admitting: Family Medicine

## 2020-11-04 DIAGNOSIS — M48061 Spinal stenosis, lumbar region without neurogenic claudication: Secondary | ICD-10-CM | POA: Diagnosis not present

## 2020-11-04 DIAGNOSIS — Z79899 Other long term (current) drug therapy: Secondary | ICD-10-CM | POA: Diagnosis not present

## 2020-11-04 DIAGNOSIS — R319 Hematuria, unspecified: Secondary | ICD-10-CM | POA: Diagnosis not present

## 2020-11-04 DIAGNOSIS — E279 Disorder of adrenal gland, unspecified: Secondary | ICD-10-CM

## 2020-11-04 DIAGNOSIS — M549 Dorsalgia, unspecified: Secondary | ICD-10-CM | POA: Diagnosis not present

## 2020-11-05 ENCOUNTER — Ambulatory Visit
Admission: RE | Admit: 2020-11-05 | Discharge: 2020-11-05 | Disposition: A | Discharge status: Home / Self Care | Payer: Medicare Other | Source: Ambulatory Visit | Attending: Family Medicine | Admitting: Family Medicine

## 2020-11-05 DIAGNOSIS — R319 Hematuria, unspecified: Secondary | ICD-10-CM | POA: Diagnosis not present

## 2020-11-05 DIAGNOSIS — K5730 Diverticulosis of large intestine without perforation or abscess without bleeding: Secondary | ICD-10-CM | POA: Diagnosis not present

## 2020-11-05 DIAGNOSIS — E279 Disorder of adrenal gland, unspecified: Secondary | ICD-10-CM

## 2020-11-05 DIAGNOSIS — K7689 Other specified diseases of liver: Secondary | ICD-10-CM | POA: Diagnosis not present

## 2020-11-05 DIAGNOSIS — D3501 Benign neoplasm of right adrenal gland: Secondary | ICD-10-CM | POA: Diagnosis not present

## 2020-11-05 MED ORDER — IOPAMIDOL (ISOVUE-300) INJECTION 61%
100.0000 mL | Freq: Once | INTRAVENOUS | Status: AC | PRN
  Administered 2020-11-05: 100 mL via INTRAVENOUS

## 2020-11-19 DIAGNOSIS — D3501 Benign neoplasm of right adrenal gland: Secondary | ICD-10-CM | POA: Diagnosis not present

## 2020-11-19 DIAGNOSIS — K7689 Other specified diseases of liver: Secondary | ICD-10-CM | POA: Diagnosis not present

## 2020-11-19 DIAGNOSIS — D3502 Benign neoplasm of left adrenal gland: Secondary | ICD-10-CM | POA: Diagnosis not present

## 2020-11-19 DIAGNOSIS — R160 Hepatomegaly, not elsewhere classified: Secondary | ICD-10-CM | POA: Diagnosis not present

## 2020-11-19 DIAGNOSIS — Q453 Other congenital malformations of pancreas and pancreatic duct: Secondary | ICD-10-CM | POA: Diagnosis not present

## 2020-11-24 DIAGNOSIS — N401 Enlarged prostate with lower urinary tract symptoms: Secondary | ICD-10-CM | POA: Diagnosis not present

## 2020-11-24 DIAGNOSIS — R339 Retention of urine, unspecified: Secondary | ICD-10-CM | POA: Diagnosis not present

## 2020-11-24 DIAGNOSIS — N319 Neuromuscular dysfunction of bladder, unspecified: Secondary | ICD-10-CM | POA: Diagnosis not present

## 2020-11-24 DIAGNOSIS — Z79899 Other long term (current) drug therapy: Secondary | ICD-10-CM | POA: Diagnosis not present

## 2020-11-24 DIAGNOSIS — R3129 Other microscopic hematuria: Secondary | ICD-10-CM | POA: Diagnosis not present

## 2020-11-25 DIAGNOSIS — H43812 Vitreous degeneration, left eye: Secondary | ICD-10-CM | POA: Diagnosis not present

## 2020-11-30 DIAGNOSIS — H9193 Unspecified hearing loss, bilateral: Secondary | ICD-10-CM | POA: Diagnosis not present

## 2020-11-30 DIAGNOSIS — H6983 Other specified disorders of Eustachian tube, bilateral: Secondary | ICD-10-CM | POA: Diagnosis not present

## 2020-11-30 DIAGNOSIS — H6593 Unspecified nonsuppurative otitis media, bilateral: Secondary | ICD-10-CM | POA: Diagnosis not present

## 2020-12-03 DIAGNOSIS — G4733 Obstructive sleep apnea (adult) (pediatric): Secondary | ICD-10-CM | POA: Diagnosis not present

## 2020-12-03 DIAGNOSIS — J301 Allergic rhinitis due to pollen: Secondary | ICD-10-CM | POA: Diagnosis not present

## 2020-12-03 DIAGNOSIS — I509 Heart failure, unspecified: Secondary | ICD-10-CM | POA: Diagnosis not present

## 2020-12-03 DIAGNOSIS — J4540 Moderate persistent asthma, uncomplicated: Secondary | ICD-10-CM | POA: Diagnosis not present

## 2020-12-03 DIAGNOSIS — R0982 Postnasal drip: Secondary | ICD-10-CM | POA: Diagnosis not present

## 2020-12-07 DIAGNOSIS — M47816 Spondylosis without myelopathy or radiculopathy, lumbar region: Secondary | ICD-10-CM | POA: Diagnosis not present

## 2020-12-16 ENCOUNTER — Other Ambulatory Visit: Payer: Self-pay | Admitting: Cardiology

## 2020-12-17 DIAGNOSIS — N401 Enlarged prostate with lower urinary tract symptoms: Secondary | ICD-10-CM | POA: Diagnosis not present

## 2020-12-17 DIAGNOSIS — R3129 Other microscopic hematuria: Secondary | ICD-10-CM | POA: Diagnosis not present

## 2020-12-23 ENCOUNTER — Other Ambulatory Visit: Payer: Self-pay | Admitting: Cardiology

## 2020-12-23 DIAGNOSIS — Z6839 Body mass index (BMI) 39.0-39.9, adult: Secondary | ICD-10-CM | POA: Diagnosis not present

## 2020-12-23 DIAGNOSIS — Z0000 Encounter for general adult medical examination without abnormal findings: Secondary | ICD-10-CM | POA: Diagnosis not present

## 2020-12-23 DIAGNOSIS — Z1331 Encounter for screening for depression: Secondary | ICD-10-CM | POA: Diagnosis not present

## 2020-12-23 DIAGNOSIS — E669 Obesity, unspecified: Secondary | ICD-10-CM | POA: Diagnosis not present

## 2020-12-23 DIAGNOSIS — E785 Hyperlipidemia, unspecified: Secondary | ICD-10-CM | POA: Diagnosis not present

## 2020-12-23 DIAGNOSIS — Z9181 History of falling: Secondary | ICD-10-CM | POA: Diagnosis not present

## 2021-01-05 ENCOUNTER — Other Ambulatory Visit: Payer: Self-pay | Admitting: Cardiology

## 2021-01-05 NOTE — Telephone Encounter (Signed)
Finasteride 5 mg tablet # 90 x 2 refills sent to   [facility], [place]

## 2021-01-07 DIAGNOSIS — J4540 Moderate persistent asthma, uncomplicated: Secondary | ICD-10-CM | POA: Diagnosis not present

## 2021-01-07 DIAGNOSIS — R0982 Postnasal drip: Secondary | ICD-10-CM | POA: Diagnosis not present

## 2021-01-07 DIAGNOSIS — J301 Allergic rhinitis due to pollen: Secondary | ICD-10-CM | POA: Diagnosis not present

## 2021-01-07 DIAGNOSIS — J449 Chronic obstructive pulmonary disease, unspecified: Secondary | ICD-10-CM | POA: Diagnosis not present

## 2021-01-07 DIAGNOSIS — G4733 Obstructive sleep apnea (adult) (pediatric): Secondary | ICD-10-CM | POA: Diagnosis not present

## 2021-01-07 DIAGNOSIS — I509 Heart failure, unspecified: Secondary | ICD-10-CM | POA: Diagnosis not present

## 2021-01-18 DIAGNOSIS — G8929 Other chronic pain: Secondary | ICD-10-CM | POA: Diagnosis not present

## 2021-01-18 DIAGNOSIS — M47816 Spondylosis without myelopathy or radiculopathy, lumbar region: Secondary | ICD-10-CM | POA: Diagnosis not present

## 2021-01-19 DIAGNOSIS — E559 Vitamin D deficiency, unspecified: Secondary | ICD-10-CM | POA: Diagnosis not present

## 2021-01-19 DIAGNOSIS — Z139 Encounter for screening, unspecified: Secondary | ICD-10-CM | POA: Diagnosis not present

## 2021-01-19 DIAGNOSIS — I10 Essential (primary) hypertension: Secondary | ICD-10-CM | POA: Diagnosis not present

## 2021-01-19 DIAGNOSIS — J302 Other seasonal allergic rhinitis: Secondary | ICD-10-CM | POA: Diagnosis not present

## 2021-01-19 DIAGNOSIS — I2510 Atherosclerotic heart disease of native coronary artery without angina pectoris: Secondary | ICD-10-CM | POA: Diagnosis not present

## 2021-01-19 DIAGNOSIS — J449 Chronic obstructive pulmonary disease, unspecified: Secondary | ICD-10-CM | POA: Diagnosis not present

## 2021-01-19 DIAGNOSIS — M549 Dorsalgia, unspecified: Secondary | ICD-10-CM | POA: Diagnosis not present

## 2021-01-19 DIAGNOSIS — E785 Hyperlipidemia, unspecified: Secondary | ICD-10-CM | POA: Diagnosis not present

## 2021-01-19 DIAGNOSIS — I714 Abdominal aortic aneurysm, without rupture, unspecified: Secondary | ICD-10-CM | POA: Diagnosis not present

## 2021-01-19 DIAGNOSIS — R739 Hyperglycemia, unspecified: Secondary | ICD-10-CM | POA: Diagnosis not present

## 2021-01-19 DIAGNOSIS — E279 Disorder of adrenal gland, unspecified: Secondary | ICD-10-CM | POA: Diagnosis not present

## 2021-01-19 DIAGNOSIS — G4700 Insomnia, unspecified: Secondary | ICD-10-CM | POA: Diagnosis not present

## 2021-01-21 DIAGNOSIS — E278 Other specified disorders of adrenal gland: Secondary | ICD-10-CM | POA: Diagnosis not present

## 2021-01-26 DIAGNOSIS — E278 Other specified disorders of adrenal gland: Secondary | ICD-10-CM | POA: Diagnosis not present

## 2021-02-14 DIAGNOSIS — E278 Other specified disorders of adrenal gland: Secondary | ICD-10-CM | POA: Diagnosis not present

## 2021-02-14 DIAGNOSIS — R6889 Other general symptoms and signs: Secondary | ICD-10-CM | POA: Diagnosis not present

## 2021-02-18 ENCOUNTER — Other Ambulatory Visit: Payer: Self-pay

## 2021-02-18 ENCOUNTER — Ambulatory Visit (INDEPENDENT_AMBULATORY_CARE_PROVIDER_SITE_OTHER): Payer: Medicare Other | Admitting: Cardiology

## 2021-02-18 ENCOUNTER — Encounter: Payer: Self-pay | Admitting: Cardiology

## 2021-02-18 VITALS — BP 100/60 | HR 75 | Ht 67.0 in | Wt 248.2 lb

## 2021-02-18 DIAGNOSIS — I2510 Atherosclerotic heart disease of native coronary artery without angina pectoris: Secondary | ICD-10-CM

## 2021-02-18 DIAGNOSIS — E119 Type 2 diabetes mellitus without complications: Secondary | ICD-10-CM | POA: Diagnosis not present

## 2021-02-18 DIAGNOSIS — I110 Hypertensive heart disease with heart failure: Secondary | ICD-10-CM

## 2021-02-18 DIAGNOSIS — G4733 Obstructive sleep apnea (adult) (pediatric): Secondary | ICD-10-CM

## 2021-02-18 DIAGNOSIS — E785 Hyperlipidemia, unspecified: Secondary | ICD-10-CM | POA: Diagnosis not present

## 2021-02-18 NOTE — Progress Notes (Signed)
Cardiology Office Note:    Date:  02/18/2021   ID:  Phillip Dunn, DOB February 17, 1951, MRN [id number]  PCP:  Phillip Reel, PA  Cardiologist:  Phillip Campus, MD    Referring MD: Phillip Dunn, [place]   Chief Complaint  Patient presents with   blood work results  Doing okay  History of Present Illness:    Phillip Dunn is a 70 y.o. male  with past medical history significant for coronary artery disease status post coronary artery bypass graft years ago, in January 2020 when he ended up going to the hospital with non-STEMI which was probably triggered by the fact that his dual antiplatelet therapy was interrupted for back injection. Cardiac catheterization at that time find to have total occlusion of first obtuse marginal branch however opening up the lesion was unsuccessful as he was treated medically. He also got history of essential hypertension, dyslipidemia, diabetes, obstructive sleep apnea. Comes today to my office for follow-up.  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  Still some test being performed trying to figure out what the problem is.  Denies have any chest pain tightness squeezing pressure burning chest.  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 Multiple stents to circumflex artery in March 18, 2014   Diabetes mellitus without  complication ([place])    Dyslipidemia 12/08/2014   Essential hypertension 03/03/2015   Hepatitis    Hyperlipidemia    Myocardial infarct (HCC)    OSA (obstructive sleep apnea)    Peptic ulcer    Primary insomnia    Secondary hypotension    Sleep apnea 11/28/2016   Venous insufficiency of left lower extremity     Past Surgical History:  Procedure Laterality Date   CARDIAC CATHETERIZATION     CARPAL TUNNEL RELEASE Left    CATARACT EXTRACTION, BILATERAL     CORONARY ANGIOPLASTY     CORONARY ARTERY BYPASS GRAFT     Right Hip replacement Right 06/2020    Current Medications: Current Meds  Medication Sig   albuterol (VENTOLIN HFA) 108 (90 Base) MCG/ACT inhaler Inhale 2 puffs into the lungs every 4 (four) hours as needed for wheezing.   aspirin EC 81 MG tablet Take 81 mg by mouth daily.   atorvastatin (LIPITOR) 80 MG tablet TAKE 1 TABLET EVERY DAY (Patient taking differently: Take 80 mg by mouth daily.)   clopidogrel (PLAVIX) 75 MG tablet TAKE 1 TABLET EVERY DAY (Patient taking differently: Take 75 mg by mouth daily.)   Coenzyme Q10 (COQ-10) 10 MG CAPS Take 10 mg by mouth daily.   finasteride (PROSCAR) 5 MG tablet TAKE 1 TABLET EVERY DAY (Patient taking differently: Take 5 mg by mouth daily.)   FLUCONAZOLE PO Inhale 1 puff into the lungs 2 (two) times daily. Unknown strength   furosemide (LASIX) 40 MG tablet  Take 1.5 tablets (60 mg total) by mouth daily.   KRILL OIL PO Take 1,000 mg by mouth daily.   lisinopril (ZESTRIL) 2.5 MG tablet TAKE 1 TABLET EVERY DAY (Patient taking differently: Take 2.5 mg by mouth daily.)   montelukast (SINGULAIR) 10 MG tablet Take 10 mg by mouth daily after breakfast.   nitroGLYCERIN (NITROSTAT) 0.4 MG SL tablet Place 0.4 mg under the tongue every 5 (five) minutes as needed for chest pain.    OVER THE COUNTER MEDICATION Take 1 tablet by mouth daily. Unknown strength   tamsulosin (FLOMAX) 0.4 MG CAPS capsule Take 0.4 mg by mouth daily.     Allergies:   Bee  pollen, Dust mite extract, Other, and Pollen extract   Social History   Socioeconomic History   Marital status: Married    Spouse name: Not on file   Number of children: Not on file   Years of education: Not on file   Highest education level: Not on file  Occupational History   Not on file  Tobacco Use   Smoking status: Former    Types: Cigarettes   Smokeless tobacco: Never  Vaping Use   Vaping Use: Former  Substance and Sexual Activity   Alcohol use: Yes    Alcohol/week: 2.0 - 3.0 standard drinks    Types: 2 - 3 Glasses of wine per week    Comment: daily   Drug use: No   Sexual activity: Not on file  Other Topics Concern   Not on file  Social History Narrative   Not on file   Social Determinants of Health   Financial Resource Strain: Not on file  Food Insecurity: Not on file  Transportation Needs: Not on file  Physical Activity: Not on file  Stress: Not on file  Social Connections: Not on file     Family History: The patient's family history includes Stroke in his mother. ROS:   Please see the history of present illness.    All 14 point review of systems negative except as described per history of present illness  EKGs/Labs/Other Studies Reviewed:      Recent Labs: No results found for requested labs within last 8760 hours.  Recent Lipid Panel    Component Value Date/Time   CHOL 98 (L) 08/04/2019 0835   TRIG 69 08/04/2019 0835   HDL 37 (L) 08/04/2019 0835   CHOLHDL 2.6 08/04/2019 0835   LDLCALC 46 08/04/2019 0835    Physical Exam:    VS:  BP 100/60 (BP Location: Left Arm, Patient Position: Sitting)   Pulse 75   Ht 5\' 7"  (1.702 m)   Wt 248 lb 3.2 oz (112.6 kg)   SpO2 95%   BMI 38.87 kg/m     Wt Readings from Last 3 Encounters:  02/18/21 248 lb 3.2 oz (112.6 kg)  08/09/20 252 lb (114.3 kg)  04/30/20 252 lb (114.3 kg)     GEN:  Well nourished, well developed in no acute distress HEENT: Normal NECK: No JVD; No carotid bruits LYMPHATICS: No  lymphadenopathy CARDIAC: RRR, no murmurs, no rubs, no gallops RESPIRATORY:  Clear to auscultation without rales, wheezing or rhonchi  ABDOMEN: Soft, non-tender, non-distended MUSCULOSKELETAL:  No edema; No deformity  SKIN: Warm and dry LOWER EXTREMITIES: no swelling NEUROLOGIC:  Alert and oriented x 3 PSYCHIATRIC:  Normal affect   ASSESSMENT:    1. Coronary artery disease involving native coronary artery of native heart without angina pectoris   2. Benign hypertensive heart disease with  CHF (congestive heart failure) ([place])   3. OSA (obstructive sleep apnea)   4. Diabetes mellitus without complication ([place])   5. Dyslipidemia    PLAN:    In order of problems listed above:  Coronary disease.  Stable from that point review denies have any chest pain tightness squeezing pressure burning chest. History of congestive heart failure seems to be compensated on physical exam but because of his profound weakness and fatigue I will repeat his echocardiogram to recheck left ventricle ejection fraction.   Obstructive sleep apnea: Followed by internal medicine team.  Diabetes mellitus.  I did review his lab work test.  Last hemoglobin A1c is 5.6 this is from 19 October this year good control. Fasting lipid profile reviewed which show LDL 55 HDL 37 this is from K PN.  Good cholesterol control we will continue present management which include Lipitor 80 which is high intensity statin.   Medication Adjustments/Labs and Tests Ordered: Current medicines are reviewed at length with the patient today.  Concerns regarding medicines are outlined above.  No orders of the defined types were placed in this encounter.  Medication changes: No orders of the defined types were placed in this encounter.   Signed, Park Liter, MD, [facility] 02/18/2021 3:44 PM    [place]

## 2021-02-18 NOTE — Patient Instructions (Signed)
Medication Instructions:  Your physician recommends that you continue on your current medications as directed. Please refer to the Current Medication list given to you today.  *If you need a refill on your cardiac medications before your next appointment, please call your pharmacy*   Lab Work: None If you have labs (blood work) drawn today and your tests are completely normal, you will receive your results only by: [place] (if you have MyChart) OR A paper copy in the mail If you have any lab test that is abnormal or we need to change your treatment, we will call you to review the results.   Testing/Procedures: Your physician has requested that you have an echocardiogram. Echocardiography is a painless test that uses sound waves to create images of your heart. It provides your doctor with information about the size and shape of your heart and how well your heart's chambers and valves are working. This procedure takes approximately one hour. There are no restrictions for this procedure.    Follow-Up: At [facility], you and your health needs are our priority.  As part of our continuing mission to provide you with exceptional heart care, we have created designated Provider Care Teams.  These Care Teams include your primary Cardiologist (physician) and Advanced Practice Providers (APPs -  Physician Assistants and Nurse Practitioners) who all work together to provide you with the care you need, when you need it.  We recommend signing up for the patient portal called "MyChart".  Sign up information is provided on this After Visit Summary.  MyChart is used to connect with patients for Virtual Visits (Telemedicine).  Patients are able to view lab/test results, encounter notes, upcoming appointments, etc.  Non-urgent messages can be sent to your provider as well.   To learn more about what you can do with MyChart, go to [email or web address].    Your next appointment:   5  month(s)  The format for your next appointment:   In Person  Provider:   Jenne Campus, MD    Other Instructions  Echocardiogram An echocardiogram is a test that uses sound waves (ultrasound) to produce images of the heart. Images from an echocardiogram can provide important information about: Heart size and shape. The size and thickness and movement of your heart's walls. Heart muscle function and strength. Heart valve function or if you have stenosis. Stenosis is when the heart valves are too narrow. If blood is flowing backward through the heart valves (regurgitation). A tumor or infectious growth around the heart valves. Areas of heart muscle that are not working well because of poor blood flow or injury from a heart attack. Aneurysm detection. An aneurysm is a weak or damaged part of an artery wall. The wall bulges out from the normal force of blood pumping through the body. Tell a health care provider about: Any allergies you have. All medicines you are taking, including vitamins, herbs, eye drops, creams, and over-the-counter medicines. Any blood disorders you have. Any surgeries you have had. Any medical conditions you have. Whether you are pregnant or may be pregnant. What are the risks? Generally, this is a safe test. However, problems may occur, including an allergic reaction to dye (contrast) that may be used during the test. What happens before the test? No specific preparation is needed. You may eat and drink normally. What happens during the test?  You will take off your clothes from the waist up and put on a hospital gown. Electrodes or electrocardiogram (ECG)patches may be  placed on your chest. The electrodes or patches are then connected to a device that monitors your heart rate and rhythm. You will lie down on a table for an ultrasound exam. A gel will be applied to your chest to help sound waves pass through your skin. A handheld device, called a  transducer, will be pressed against your chest and moved over your heart. The transducer produces sound waves that travel to your heart and bounce back (or "echo" back) to the transducer. These sound waves will be captured in real-time and changed into images of your heart that can be viewed on a video monitor. The images will be recorded on a computer and reviewed by your health care provider. You may be asked to change positions or hold your breath for a short time. This makes it easier to get different views or better views of your heart. In some cases, you may receive contrast through an IV in one of your veins. This can improve the quality of the pictures from your heart. The procedure may vary among health care providers and hospitals. What can I expect after the test? You may return to your normal, everyday life, including diet, activities, and medicines, unless your health care provider tells you not to do that. Follow these instructions at home: It is up to you to get the results of your test. Ask your health care provider, or the department that is doing the test, when your results will be ready. Keep all follow-up visits. This is important. Summary An echocardiogram is a test that uses sound waves (ultrasound) to produce images of the heart. Images from an echocardiogram can provide important information about the size and shape of your heart, heart muscle function, heart valve function, and other possible heart problems. You do not need to do anything to prepare before this test. You may eat and drink normally. After the echocardiogram is completed, you may return to your normal, everyday life, unless your health care provider tells you not to do that. This information is not intended to replace advice given to you by your health care provider. Make sure you discuss any questions you have with your health care provider. Document Revised: 12/01/2020 Document Reviewed: 11/11/2019 Elsevier  Patient Education  2022 [employer].

## 2021-03-07 DIAGNOSIS — E278 Other specified disorders of adrenal gland: Secondary | ICD-10-CM | POA: Diagnosis not present

## 2021-03-14 ENCOUNTER — Ambulatory Visit (INDEPENDENT_AMBULATORY_CARE_PROVIDER_SITE_OTHER): Payer: Medicare Other

## 2021-03-14 ENCOUNTER — Other Ambulatory Visit: Payer: Self-pay

## 2021-03-14 DIAGNOSIS — E785 Hyperlipidemia, unspecified: Secondary | ICD-10-CM | POA: Diagnosis not present

## 2021-03-14 DIAGNOSIS — E119 Type 2 diabetes mellitus without complications: Secondary | ICD-10-CM | POA: Diagnosis not present

## 2021-03-14 DIAGNOSIS — I2510 Atherosclerotic heart disease of native coronary artery without angina pectoris: Secondary | ICD-10-CM | POA: Diagnosis not present

## 2021-03-14 DIAGNOSIS — I110 Hypertensive heart disease with heart failure: Secondary | ICD-10-CM

## 2021-03-14 DIAGNOSIS — G4733 Obstructive sleep apnea (adult) (pediatric): Secondary | ICD-10-CM

## 2021-03-14 LAB — ECHOCARDIOGRAM COMPLETE
AR max vel: 1.29 cm2
AV Area VTI: 1.29 cm2
AV Area mean vel: 1.25 cm2
AV Mean grad: 12 mmHg
AV Peak grad: 20.1 mmHg
Ao pk vel: 2.24 m/s
Area-P 1/2: 3.95 cm2
S' Lateral: 5.2 cm

## 2021-03-29 DIAGNOSIS — H9193 Unspecified hearing loss, bilateral: Secondary | ICD-10-CM | POA: Diagnosis not present

## 2021-03-29 DIAGNOSIS — J342 Deviated nasal septum: Secondary | ICD-10-CM | POA: Diagnosis not present

## 2021-03-29 DIAGNOSIS — H6983 Other specified disorders of Eustachian tube, bilateral: Secondary | ICD-10-CM | POA: Diagnosis not present

## 2021-03-29 DIAGNOSIS — H6591 Unspecified nonsuppurative otitis media, right ear: Secondary | ICD-10-CM | POA: Diagnosis not present

## 2021-04-11 DIAGNOSIS — I509 Heart failure, unspecified: Secondary | ICD-10-CM | POA: Diagnosis not present

## 2021-04-11 DIAGNOSIS — J301 Allergic rhinitis due to pollen: Secondary | ICD-10-CM | POA: Diagnosis not present

## 2021-04-11 DIAGNOSIS — G4733 Obstructive sleep apnea (adult) (pediatric): Secondary | ICD-10-CM | POA: Diagnosis not present

## 2021-04-11 DIAGNOSIS — R918 Other nonspecific abnormal finding of lung field: Secondary | ICD-10-CM | POA: Diagnosis not present

## 2021-04-11 DIAGNOSIS — J4540 Moderate persistent asthma, uncomplicated: Secondary | ICD-10-CM | POA: Diagnosis not present

## 2021-04-11 DIAGNOSIS — R0982 Postnasal drip: Secondary | ICD-10-CM | POA: Diagnosis not present

## 2021-04-20 DIAGNOSIS — R918 Other nonspecific abnormal finding of lung field: Secondary | ICD-10-CM | POA: Diagnosis not present

## 2021-04-20 DIAGNOSIS — R911 Solitary pulmonary nodule: Secondary | ICD-10-CM | POA: Diagnosis not present

## 2021-04-20 DIAGNOSIS — J439 Emphysema, unspecified: Secondary | ICD-10-CM | POA: Diagnosis not present

## 2021-04-20 DIAGNOSIS — I700 Atherosclerosis of aorta: Secondary | ICD-10-CM | POA: Diagnosis not present

## 2021-04-29 DIAGNOSIS — R7989 Other specified abnormal findings of blood chemistry: Secondary | ICD-10-CM | POA: Diagnosis not present

## 2021-04-29 DIAGNOSIS — E278 Other specified disorders of adrenal gland: Secondary | ICD-10-CM | POA: Diagnosis not present

## 2021-05-03 DIAGNOSIS — I509 Heart failure, unspecified: Secondary | ICD-10-CM | POA: Diagnosis not present

## 2021-05-03 DIAGNOSIS — I10 Essential (primary) hypertension: Secondary | ICD-10-CM | POA: Diagnosis not present

## 2021-05-03 DIAGNOSIS — E785 Hyperlipidemia, unspecified: Secondary | ICD-10-CM | POA: Diagnosis not present

## 2021-05-16 DIAGNOSIS — G4733 Obstructive sleep apnea (adult) (pediatric): Secondary | ICD-10-CM | POA: Diagnosis not present

## 2021-05-17 DIAGNOSIS — I509 Heart failure, unspecified: Secondary | ICD-10-CM | POA: Diagnosis not present

## 2021-05-17 DIAGNOSIS — J4550 Severe persistent asthma, uncomplicated: Secondary | ICD-10-CM | POA: Diagnosis not present

## 2021-05-17 DIAGNOSIS — J4540 Moderate persistent asthma, uncomplicated: Secondary | ICD-10-CM | POA: Diagnosis not present

## 2021-05-17 DIAGNOSIS — J301 Allergic rhinitis due to pollen: Secondary | ICD-10-CM | POA: Diagnosis not present

## 2021-05-17 DIAGNOSIS — R918 Other nonspecific abnormal finding of lung field: Secondary | ICD-10-CM | POA: Diagnosis not present

## 2021-05-17 DIAGNOSIS — R0982 Postnasal drip: Secondary | ICD-10-CM | POA: Diagnosis not present

## 2021-05-17 DIAGNOSIS — G4733 Obstructive sleep apnea (adult) (pediatric): Secondary | ICD-10-CM | POA: Diagnosis not present

## 2021-05-27 DIAGNOSIS — I2510 Atherosclerotic heart disease of native coronary artery without angina pectoris: Secondary | ICD-10-CM | POA: Diagnosis not present

## 2021-05-27 DIAGNOSIS — R739 Hyperglycemia, unspecified: Secondary | ICD-10-CM | POA: Diagnosis not present

## 2021-05-27 DIAGNOSIS — E279 Disorder of adrenal gland, unspecified: Secondary | ICD-10-CM | POA: Diagnosis not present

## 2021-05-27 DIAGNOSIS — E785 Hyperlipidemia, unspecified: Secondary | ICD-10-CM | POA: Diagnosis not present

## 2021-05-27 DIAGNOSIS — J449 Chronic obstructive pulmonary disease, unspecified: Secondary | ICD-10-CM | POA: Diagnosis not present

## 2021-05-27 DIAGNOSIS — I110 Hypertensive heart disease with heart failure: Secondary | ICD-10-CM | POA: Diagnosis not present

## 2021-05-31 DIAGNOSIS — J449 Chronic obstructive pulmonary disease, unspecified: Secondary | ICD-10-CM | POA: Diagnosis not present

## 2021-05-31 DIAGNOSIS — I110 Hypertensive heart disease with heart failure: Secondary | ICD-10-CM | POA: Diagnosis not present

## 2021-05-31 DIAGNOSIS — I2510 Atherosclerotic heart disease of native coronary artery without angina pectoris: Secondary | ICD-10-CM | POA: Diagnosis not present

## 2021-06-10 DIAGNOSIS — S61213A Laceration without foreign body of left middle finger without damage to nail, initial encounter: Secondary | ICD-10-CM | POA: Diagnosis not present

## 2021-06-12 DIAGNOSIS — L03019 Cellulitis of unspecified finger: Secondary | ICD-10-CM | POA: Diagnosis not present

## 2021-06-12 DIAGNOSIS — S61213D Laceration without foreign body of left middle finger without damage to nail, subsequent encounter: Secondary | ICD-10-CM | POA: Diagnosis not present

## 2021-07-19 ENCOUNTER — Ambulatory Visit (INDEPENDENT_AMBULATORY_CARE_PROVIDER_SITE_OTHER): Payer: Medicare Other | Admitting: Cardiology

## 2021-07-19 ENCOUNTER — Encounter: Payer: Self-pay | Admitting: Cardiology

## 2021-07-19 VITALS — BP 114/70 | HR 72 | Ht 67.0 in | Wt 256.4 lb

## 2021-07-19 DIAGNOSIS — R5383 Other fatigue: Secondary | ICD-10-CM

## 2021-07-19 DIAGNOSIS — R0602 Shortness of breath: Secondary | ICD-10-CM

## 2021-07-19 DIAGNOSIS — I2510 Atherosclerotic heart disease of native coronary artery without angina pectoris: Secondary | ICD-10-CM

## 2021-07-19 DIAGNOSIS — E559 Vitamin D deficiency, unspecified: Secondary | ICD-10-CM

## 2021-07-19 DIAGNOSIS — I10 Essential (primary) hypertension: Secondary | ICD-10-CM | POA: Diagnosis not present

## 2021-07-19 DIAGNOSIS — G4733 Obstructive sleep apnea (adult) (pediatric): Secondary | ICD-10-CM

## 2021-07-19 DIAGNOSIS — I872 Venous insufficiency (chronic) (peripheral): Secondary | ICD-10-CM | POA: Diagnosis not present

## 2021-07-19 DIAGNOSIS — I2541 Coronary artery aneurysm: Secondary | ICD-10-CM | POA: Diagnosis not present

## 2021-07-19 DIAGNOSIS — E785 Hyperlipidemia, unspecified: Secondary | ICD-10-CM | POA: Diagnosis not present

## 2021-07-19 DIAGNOSIS — D649 Anemia, unspecified: Secondary | ICD-10-CM

## 2021-07-19 NOTE — Patient Instructions (Signed)
Medication Instructions:  ?Your physician recommends that you continue on your current medications as directed. Please refer to the Current Medication list given to you today.  ?*If you need a refill on your cardiac medications before your next appointment, please call your pharmacy* ? ? ?Lab Work: ?Your physician recommends that you have a  TSH, Vitamin B12, Vitamin D3 today ?If you have labs (blood work) drawn today and your tests are completely normal, you will receive your results only by: ?MyChart Message (if you have MyChart) OR ?A paper copy in the mail ?If you have any lab test that is abnormal or we need to change your treatment, we will call you to review the results. ? ? ?Testing/Procedures: ?None ordered ? ? ?Follow-Up: ?At [facility], you and your health needs are our priority.  As part of our continuing mission to provide you with exceptional heart care, we have created designated Provider Care Teams.  These Care Teams include your primary Cardiologist (physician) and Advanced Practice Providers (APPs -  Physician Assistants and Nurse Practitioners) who all work together to provide you with the care you need, when you need it. ? ?We recommend signing up for the patient portal called "MyChart".  Sign up information is provided on this After Visit Summary.  MyChart is used to connect with patients for Virtual Visits (Telemedicine).  Patients are able to view lab/test results, encounter notes, upcoming appointments, etc.  Non-urgent messages can be sent to your provider as well.   ?To learn more about what you can do with MyChart, go to [email or web address].   ? ?Your next appointment:   ?6 month(s) ? ?The format for your next appointment:   ?In Person ? ?Provider:   ?Jenne Campus, MD  ? ? ?Other Instructions ?None ? ?Important Information About Sugar ? ? ? ? ?  ?

## 2021-07-19 NOTE — Progress Notes (Signed)
?Cardiology Office Note:   ? ?Date:  07/19/2021  ? ?ID:  Phillip Dunn, DOB 07/01/1950, MRN [id number] ? ?PCP:  Nicoletta Dress, MD  ?Cardiologist:  Jenne Campus, MD   ? ?Referring MD: Renaldo Reel, PA  ? ?Chief Complaint  ?Patient presents with  ? Follow-up  ?Doing well ? ?History of Present Illness:   ? ?Phillip Dunn is a 71 y.o. male with past medical history significant for coronary artery disease, status postcoronary bypass graft done many years ago in January 2020 he ended up going to the hospital with non-STEMI which was probably triggered by the fact that his dual antiplatelet therapy has been interrupted secondary to back injection cardiac catheterization done at that time showed total occlusion of the first obtuse marginal branch we tried to open the lesion however were unsuccessful and decision has been made to treat medically his echocardiograms been repeated since that time showing ejection fraction of 50 to 55% this is from December 2022 he is additional past medical history include dyslipidemia, essential hypertension, obstructive sleep apnea. ? ?Past Medical History:  ?Diagnosis Date  ? Adrenal disorder ([place])   ? Asthma   ? 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 Bradycardia   ? COPD (chronic obstructive pulmonary disease) ([place])   ? Coronary artery aneurysm 06/19/2017  ? Measuring 14 mm on CT from February 2019  ? Coronary artery disease   ? Coronary artery disease involving native coronary artery of native heart without angina pectoris 12/08/2014  ? Overview:  Multiple stents to circumflex artery in March 18, 2014  Formatting of this note might be different from the original. Multiple stents to circumflex artery in March 18, 2014  ? Diabetes mellitus without complication ([place])   ? Dyslipidemia 12/08/2014  ? Essential hypertension 03/03/2015  ? Hepatitis   ? Hyperlipidemia   ? Myocardial infarct [facility])   ? OSA (obstructive sleep apnea)   ?  Peptic ulcer   ? Primary insomnia   ? Secondary hypotension   ? Sleep apnea 11/28/2016  ? Venous insufficiency of left lower extremity   ? ? ?Past Surgical History:  ?Procedure Laterality Date  ? CARDIAC CATHETERIZATION    ? CARPAL TUNNEL RELEASE Left   ? CATARACT EXTRACTION, BILATERAL    ? CORONARY ANGIOPLASTY    ? CORONARY ARTERY BYPASS GRAFT    ? Right Hip replacement Right 06/2020  ? ? ?Current Medications: ?Current Meds  ?Medication Sig  ? albuterol (VENTOLIN HFA) 108 (90 Base) MCG/ACT inhaler Inhale 2 puffs into the lungs every 4 (four) hours as needed for wheezing.  ? aspirin EC 81 MG tablet Take 81 mg by mouth daily.  ? atorvastatin (LIPITOR) 80 MG tablet TAKE 1 TABLET EVERY DAY (Patient taking differently: Take 80 mg by mouth daily.)  ? clopidogrel (PLAVIX) 75 MG tablet TAKE 1 TABLET EVERY DAY (Patient taking differently: Take 75 mg by mouth daily.)  ? Coenzyme Q10 (COQ-10) 10 MG CAPS Take 10 mg by mouth daily.  ? finasteride (PROSCAR) 5 MG tablet TAKE 1 TABLET EVERY DAY (Patient taking differently: Take 5 mg by mouth daily.)  ? furosemide (LASIX) 40 MG tablet Take 1.5 tablets (60 mg total) by mouth daily.  ? IPRATROPIUM BROMIDE IN Place 1 spray into both nostrils 2 (two) times daily.  ? KRILL OIL PO Take 1,000 mg by mouth daily.  ? lisinopril (ZESTRIL) 2.5 MG tablet TAKE 1 TABLET EVERY DAY (Patient taking differently: Take 2.5 mg by  mouth daily.)  ? montelukast (SINGULAIR) 10 MG tablet Take 10 mg by mouth daily after breakfast.  ? nitroGLYCERIN (NITROSTAT) 0.4 MG SL tablet Place 0.4 mg under the tongue every 5 (five) minutes as needed for chest pain.   ? OVER THE COUNTER MEDICATION Take 1 tablet by mouth daily. Unknown strength  ? tamsulosin (FLOMAX) 0.4 MG CAPS capsule Take 0.4 mg by mouth daily.  ? [DISCONTINUED] FLUCONAZOLE PO Inhale 1 puff into the lungs 2 (two) times daily. Unknown strength  ?  ? ?Allergies:   Other and Pollen extract  ? ?Social History  ? ?Socioeconomic History  ? Marital status:  Married  ?  Spouse name: Not on file  ? Number of children: Not on file  ? Years of education: Not on file  ? Highest education level: Not on file  ?Occupational History  ? Not on file  ?Tobacco Use  ? Smoking status: Former  ?  Types: Cigarettes  ? Smokeless tobacco: Never  ?Vaping Use  ? Vaping Use: Former  ?Substance and Sexual Activity  ? Alcohol use: Yes  ?  Alcohol/week: 2.0 - 3.0 standard drinks  ?  Types: 2 - 3 Glasses of wine per week  ?  Comment: daily  ? Drug use: No  ? Sexual activity: Not on file  ?Other Topics Concern  ? Not on file  ?Social History Narrative  ? Not on file  ? ?Social Determinants of Health  ? ?Financial Resource Strain: Not on file  ?Food Insecurity: Not on file  ?Transportation Needs: Not on file  ?Physical Activity: Not on file  ?Stress: Not on file  ?Social Connections: Not on file  ?  ? ?Family History: ?The patient's family history includes Stroke in his mother. ?ROS:   ?Please see the history of present illness.    ?All 14 point review of systems negative except as described per history of present illness ? ?EKGs/Labs/Other Studies Reviewed:   ? ? ? ?Recent Labs: ?No results found for requested labs within last 8760 hours.  ?Recent Lipid Panel ?   ?Component Value Date/Time  ? CHOL 98 (L) 08/04/2019 0835  ? TRIG 69 08/04/2019 0835  ? HDL 37 (L) 08/04/2019 0835  ? CHOLHDL 2.6 08/04/2019 0835  ? Phillip Dunn 46 08/04/2019 0835  ? ? ?Physical Exam:   ? ?VS:  BP 114/70 (BP Location: Right Arm, Patient Position: Sitting)   Pulse 72   Ht '5\' 7"'$  (1.702 m)   Wt 256 lb 6.4 oz (116.3 kg)   SpO2 91%   BMI 40.16 kg/m?    ? ?Wt Readings from Last 3 Encounters:  ?07/19/21 256 lb 6.4 oz (116.3 kg)  ?02/18/21 248 lb 3.2 oz (112.6 kg)  ?08/09/20 252 lb (114.3 kg)  ?  ? ?GEN:  Well nourished, well developed in no acute distress ?HEENT: Normal ?NECK: No JVD; No carotid bruits ?LYMPHATICS: No lymphadenopathy ?CARDIAC: RRR, no murmurs, no rubs, no gallops ?RESPIRATORY:  Clear to auscultation without  rales, wheezing or rhonchi  ?ABDOMEN: Soft, non-tender, non-distended ?MUSCULOSKELETAL:  No edema; No deformity  ?SKIN: Warm and dry ?LOWER EXTREMITIES: no swelling ?NEUROLOGIC:  Alert and oriented x 3 ?PSYCHIATRIC:  Normal affect  ? ?ASSESSMENT:   ? ?1. Coronary artery disease involving native coronary artery of native heart without angina pectoris   ?2. Essential hypertension   ?3. Coronary artery aneurysm   ?4. Venous insufficiency of left lower extremity   ?5. OSA (obstructive sleep apnea)   ?6. Dyslipidemia   ? ?  PLAN:   ? ?In order of problems listed above: ? ?Coronary disease stable from that point review doing well denies have any chest pain tightness squeezing pressure burning chest we will continue dual antiplatelet therapy and definitely. ?Essential hypertension blood pressure is well controlled we will continue present medications. ?Dyslipidemia I did review K PN which show me his LDL 61 HDL 45.  Continue present management ?Obstructive sleep apnea he does use CPAP mask on the regular basis. ?Weakness fatigue tiredness that he complains about we will check his TSH B12 as well as D3 level ? ? ?Medication Adjustments/Labs and Tests Ordered: ?Current medicines are reviewed at length with the patient today.  Concerns regarding medicines are outlined above.  ?No orders of the defined types were placed in this encounter. ? ?Medication changes: No orders of the defined types were placed in this encounter. ? ? ?Signed, ?Park Liter, MD, [facility] ?07/19/2021 8:45 AM    ?[place] ?

## 2021-07-20 LAB — VITAMIN D 25 HYDROXY (VIT D DEFICIENCY, FRACTURES): Vit D, 25-Hydroxy: 54 ng/mL (ref 30.0–100.0)

## 2021-07-20 LAB — TSH: TSH: 1.39 u[IU]/mL (ref 0.450–4.500)

## 2021-07-22 DIAGNOSIS — R6889 Other general symptoms and signs: Secondary | ICD-10-CM | POA: Diagnosis not present

## 2021-07-22 DIAGNOSIS — E278 Other specified disorders of adrenal gland: Secondary | ICD-10-CM | POA: Diagnosis not present

## 2021-07-25 ENCOUNTER — Other Ambulatory Visit: Payer: Self-pay | Admitting: Cardiology

## 2021-07-31 DIAGNOSIS — J449 Chronic obstructive pulmonary disease, unspecified: Secondary | ICD-10-CM | POA: Diagnosis not present

## 2021-07-31 DIAGNOSIS — I509 Heart failure, unspecified: Secondary | ICD-10-CM | POA: Diagnosis not present

## 2021-07-31 DIAGNOSIS — I110 Hypertensive heart disease with heart failure: Secondary | ICD-10-CM | POA: Diagnosis not present

## 2021-08-02 DIAGNOSIS — R7989 Other specified abnormal findings of blood chemistry: Secondary | ICD-10-CM

## 2021-08-02 DIAGNOSIS — R6889 Other general symptoms and signs: Secondary | ICD-10-CM | POA: Insufficient documentation

## 2021-08-02 HISTORY — DX: Other general symptoms and signs: R68.89

## 2021-08-02 HISTORY — DX: Other specified abnormal findings of blood chemistry: R79.89

## 2021-08-05 DIAGNOSIS — G4733 Obstructive sleep apnea (adult) (pediatric): Secondary | ICD-10-CM | POA: Diagnosis not present

## 2021-08-05 DIAGNOSIS — J301 Allergic rhinitis due to pollen: Secondary | ICD-10-CM | POA: Diagnosis not present

## 2021-08-05 DIAGNOSIS — R918 Other nonspecific abnormal finding of lung field: Secondary | ICD-10-CM | POA: Diagnosis not present

## 2021-08-05 DIAGNOSIS — R0982 Postnasal drip: Secondary | ICD-10-CM | POA: Diagnosis not present

## 2021-08-05 DIAGNOSIS — J4540 Moderate persistent asthma, uncomplicated: Secondary | ICD-10-CM | POA: Diagnosis not present

## 2021-08-05 DIAGNOSIS — I509 Heart failure, unspecified: Secondary | ICD-10-CM | POA: Diagnosis not present

## 2021-08-08 DIAGNOSIS — E278 Other specified disorders of adrenal gland: Secondary | ICD-10-CM | POA: Diagnosis not present

## 2021-08-08 DIAGNOSIS — R6889 Other general symptoms and signs: Secondary | ICD-10-CM | POA: Diagnosis not present

## 2021-08-08 DIAGNOSIS — R7989 Other specified abnormal findings of blood chemistry: Secondary | ICD-10-CM

## 2021-08-08 HISTORY — DX: Morbid (severe) obesity due to excess calories: E66.01

## 2021-08-08 HISTORY — DX: Other specified abnormal findings of blood chemistry: R79.89

## 2021-08-26 DIAGNOSIS — E538 Deficiency of other specified B group vitamins: Secondary | ICD-10-CM | POA: Diagnosis not present

## 2021-08-26 DIAGNOSIS — I700 Atherosclerosis of aorta: Secondary | ICD-10-CM | POA: Diagnosis not present

## 2021-08-26 DIAGNOSIS — I2510 Atherosclerotic heart disease of native coronary artery without angina pectoris: Secondary | ICD-10-CM | POA: Diagnosis not present

## 2021-08-26 DIAGNOSIS — D3500 Benign neoplasm of unspecified adrenal gland: Secondary | ICD-10-CM | POA: Diagnosis not present

## 2021-08-26 DIAGNOSIS — I110 Hypertensive heart disease with heart failure: Secondary | ICD-10-CM | POA: Diagnosis not present

## 2021-08-26 DIAGNOSIS — R739 Hyperglycemia, unspecified: Secondary | ICD-10-CM | POA: Diagnosis not present

## 2021-08-26 DIAGNOSIS — R262 Difficulty in walking, not elsewhere classified: Secondary | ICD-10-CM | POA: Diagnosis not present

## 2021-08-26 DIAGNOSIS — J449 Chronic obstructive pulmonary disease, unspecified: Secondary | ICD-10-CM | POA: Diagnosis not present

## 2021-08-26 DIAGNOSIS — E785 Hyperlipidemia, unspecified: Secondary | ICD-10-CM | POA: Diagnosis not present

## 2021-10-06 ENCOUNTER — Other Ambulatory Visit: Payer: Self-pay | Admitting: Cardiology

## 2021-10-20 ENCOUNTER — Other Ambulatory Visit: Payer: Self-pay | Admitting: Cardiology

## 2021-11-04 DIAGNOSIS — J4540 Moderate persistent asthma, uncomplicated: Secondary | ICD-10-CM | POA: Diagnosis not present

## 2021-11-04 DIAGNOSIS — G4733 Obstructive sleep apnea (adult) (pediatric): Secondary | ICD-10-CM | POA: Diagnosis not present

## 2021-11-04 DIAGNOSIS — J301 Allergic rhinitis due to pollen: Secondary | ICD-10-CM | POA: Diagnosis not present

## 2021-11-04 DIAGNOSIS — I509 Heart failure, unspecified: Secondary | ICD-10-CM | POA: Diagnosis not present

## 2021-11-04 DIAGNOSIS — R918 Other nonspecific abnormal finding of lung field: Secondary | ICD-10-CM | POA: Diagnosis not present

## 2021-11-04 DIAGNOSIS — R0982 Postnasal drip: Secondary | ICD-10-CM | POA: Diagnosis not present

## 2021-11-30 DIAGNOSIS — R739 Hyperglycemia, unspecified: Secondary | ICD-10-CM | POA: Diagnosis not present

## 2021-11-30 DIAGNOSIS — I2510 Atherosclerotic heart disease of native coronary artery without angina pectoris: Secondary | ICD-10-CM | POA: Diagnosis not present

## 2021-11-30 DIAGNOSIS — I110 Hypertensive heart disease with heart failure: Secondary | ICD-10-CM | POA: Diagnosis not present

## 2021-11-30 DIAGNOSIS — F5104 Psychophysiologic insomnia: Secondary | ICD-10-CM | POA: Diagnosis not present

## 2021-11-30 DIAGNOSIS — E785 Hyperlipidemia, unspecified: Secondary | ICD-10-CM | POA: Diagnosis not present

## 2021-11-30 DIAGNOSIS — D3500 Benign neoplasm of unspecified adrenal gland: Secondary | ICD-10-CM | POA: Diagnosis not present

## 2021-11-30 DIAGNOSIS — J449 Chronic obstructive pulmonary disease, unspecified: Secondary | ICD-10-CM | POA: Diagnosis not present

## 2021-11-30 DIAGNOSIS — I700 Atherosclerosis of aorta: Secondary | ICD-10-CM | POA: Diagnosis not present

## 2021-11-30 DIAGNOSIS — Z125 Encounter for screening for malignant neoplasm of prostate: Secondary | ICD-10-CM | POA: Diagnosis not present

## 2021-11-30 DIAGNOSIS — G4762 Sleep related leg cramps: Secondary | ICD-10-CM | POA: Diagnosis not present

## 2022-01-31 ENCOUNTER — Other Ambulatory Visit: Payer: Self-pay

## 2022-02-01 ENCOUNTER — Ambulatory Visit: Payer: Medicare Other | Attending: Cardiology | Admitting: Cardiology

## 2022-02-01 ENCOUNTER — Encounter: Payer: Self-pay | Admitting: Cardiology

## 2022-02-01 VITALS — BP 124/60 | HR 74 | Ht 67.0 in | Wt 254.8 lb

## 2022-02-01 DIAGNOSIS — I10 Essential (primary) hypertension: Secondary | ICD-10-CM | POA: Diagnosis not present

## 2022-02-01 DIAGNOSIS — I2510 Atherosclerotic heart disease of native coronary artery without angina pectoris: Secondary | ICD-10-CM | POA: Diagnosis not present

## 2022-02-01 DIAGNOSIS — I2541 Coronary artery aneurysm: Secondary | ICD-10-CM | POA: Diagnosis not present

## 2022-02-01 DIAGNOSIS — E782 Mixed hyperlipidemia: Secondary | ICD-10-CM | POA: Insufficient documentation

## 2022-02-01 NOTE — Progress Notes (Unsigned)
Cardiology Office Note:    Date:  02/01/2022   ID:  [name], DOB September 17, 1950, MRN [id number]  PCP:  Nicoletta Dress, MD  Cardiologist:  Jenne Campus, MD    Referring MD: Nicoletta Dress, MD   Chief Complaint  Patient presents with   unstable balance     Ongoing for months    History of Present Illness:    Phillip Dunn is a 71 y.o. male  with past medical history significant for coronary artery disease, status postcoronary bypass graft done many years ago in January 2020 he ended up going to the hospital with non-STEMI which was probably triggered by the fact that his dual antiplatelet therapy has been interrupted secondary to back injection cardiac catheterization done at that time showed total occlusion of the first obtuse marginal branch we tried to open the lesion however were unsuccessful and decision has been made to treat medically his echocardiograms been repeated since that time showing ejection fraction of 50 to 55% this is from December 2022 he is additional past medical history include dyslipidemia, essential hypertension, obstructive sleep apnea. He is in my office today for follow-up.  Overall he is doing very well.  He denies of any chest pain tightness squeezing pressure burning chest he brought to issues first is a balance problem he said he have to walk very carefully if he is not considering a walking he will lose balance.  He is using a cane which seems to be helping with that.  Another issue he told me that he is unhappy with his pulmonologist and he is thinking about switching to somebody else.  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 Multiple stents to circumflex artery in March 18, 2014   Diabetes mellitus without complication ([place])    Dyslipidemia 12/08/2014   Essential hypertension 03/03/2015   Hepatitis    Hyperlipidemia    Myocardial infarct ([facility])    OSA (obstructive sleep apnea)    Peptic ulcer    Primary insomnia    Secondary hypotension    Sleep apnea 11/28/2016   Venous insufficiency of left lower extremity     Past Surgical History:  Procedure Laterality Date   CARDIAC CATHETERIZATION     CARPAL TUNNEL RELEASE Left    CATARACT EXTRACTION, BILATERAL     CORONARY ANGIOPLASTY     CORONARY ARTERY BYPASS GRAFT     Right Hip replacement Right 06/2020    Current Medications: Current Meds  Medication Sig   albuterol (VENTOLIN HFA) 108 (90 Base) MCG/ACT inhaler Inhale 2 puffs into the lungs every 4 (four) hours as needed for wheezing.   aspirin EC 81 MG tablet Take 81 mg by mouth daily.   atorvastatin (LIPITOR) 80 MG tablet TAKE 1 TABLET EVERY DAY (Patient taking differently: Take 80 mg by mouth daily.)   clopidogrel (PLAVIX) 75 MG tablet Take 1 tablet (75 mg total) by mouth daily.   Coenzyme  Q10 (COQ-10) 10 MG CAPS Take 10 mg by mouth daily.   diphenhydrAMINE HCl (BENADRYL ALLERGY PO) Take 1 tablet by mouth at bedtime. Sleep   finasteride (PROSCAR) 5 MG tablet Take 1 tablet (5 mg total) by mouth daily.   fluticasone (FLONASE) 50 MCG/ACT nasal spray Place 2 sprays into both nostrils daily.   furosemide (LASIX) 40 MG tablet Take 1.5 tablets (60 mg total) by mouth daily.   IPRATROPIUM BROMIDE IN Place 1 spray into both nostrils 2 (two) times daily.   KRILL OIL PO Take 1,000 mg by mouth daily.   lisinopril (ZESTRIL) 2.5 MG tablet TAKE 1 TABLET EVERY DAY (Patient taking differently: Take  2.5 mg by mouth daily.)   OVER THE COUNTER MEDICATION Take 1 tablet by mouth daily. Unknown strength   tamsulosin (FLOMAX) 0.4 MG CAPS capsule Take 0.4 mg by mouth daily.   [DISCONTINUED] nitroGLYCERIN (NITROSTAT) 0.4 MG SL tablet Place 0.4 mg under the tongue every 5 (five) minutes as needed for chest pain.      Allergies:   Other and Pollen extract   Social History   Socioeconomic History   Marital status: Married    Spouse name: Not on file   Number of children: Not on file   Years of education: Not on file   Highest education level: Not on file  Occupational History   Not on file  Tobacco Use   Smoking status: Former    Types: Cigarettes   Smokeless tobacco: Never  Vaping Use   Vaping Use: Former  Substance and Sexual Activity   Alcohol use: Yes    Alcohol/week: 2.0 - 3.0 standard drinks of alcohol    Types: 2 - 3 Glasses of wine per week    Comment: daily   Drug use: No   Sexual activity: Not on file  Other Topics Concern   Not on file  Social History Narrative   Not on file   Social Determinants of Health   Financial Resource Strain: Not on file  Food Insecurity: Not on file  Transportation Needs: Not on file  Physical Activity: Not on file  Stress: Not on file  Social Connections: Not on file     Family History: The patient's family history includes Stroke in his mother. ROS:   Please see the history of present illness.    All 14 point review of systems negative except as described per history of present illness  EKGs/Labs/Other Studies Reviewed:      Recent Labs: 07/19/2021: TSH 1.390  Recent Lipid Panel    Component Value Date/Time   CHOL 98 (L) 08/04/2019 0835   TRIG 69 08/04/2019 0835   HDL 37 (L) 08/04/2019 0835   CHOLHDL 2.6 08/04/2019 0835   LDLCALC 46 08/04/2019 0835    Physical Exam:    VS:  BP 124/60 (BP Location: Left Arm, Patient Position: Sitting)   Pulse 74   Ht '5\' 7"'$  (1.702 m)   Wt 254 lb 12.8 oz (115.6 kg)   SpO2 90%    BMI 39.91 kg/m     Wt Readings from Last 3 Encounters:  02/01/22 254 lb 12.8 oz (115.6 kg)  07/19/21 256 lb 6.4 oz (116.3 kg)  02/18/21 248 lb 3.2 oz (112.6 kg)     GEN:  Well nourished, well developed in no acute distress HEENT: Normal NECK: No JVD; No carotid bruits LYMPHATICS: No lymphadenopathy CARDIAC: RRR, no murmurs, no rubs, no gallops RESPIRATORY:  Clear to auscultation without rales, wheezing or rhonchi  ABDOMEN: Soft, non-tender, non-distended MUSCULOSKELETAL:  No edema; No deformity  SKIN: Warm and dry LOWER EXTREMITIES: no swelling NEUROLOGIC:  Alert and oriented x 3 PSYCHIATRIC:  Normal affect   ASSESSMENT:    1. Coronary artery disease involving native coronary artery of native heart without angina pectoris   2. Coronary artery aneurysm   3. Essential hypertension   4. Mixed hyperlipidemia    PLAN:    In order of problems listed above:  Coronary disease stable from that point review we will continue dual antiplatelet therapy.  Asymptomatic History of coronary artery aneurysm however last cardiac catheterization did not report any evidence of it. Essential hypertension blood pressure well controlled continue present management. Dyslipidemia I did review K PN which show me LDL 40 HDL 45.  I will recheck his fasting lipid profile in the meantime continue on high intensity statin.   Medication Adjustments/Labs and Tests Ordered: Current medicines are reviewed at length with the patient today.  Concerns regarding medicines are outlined above.  No orders of the defined types were placed in this encounter.  Medication changes: No orders of the defined types were placed in this encounter.   Signed, Park Liter, MD, [facility] 02/01/2022 3:29 PM    [place]

## 2022-02-01 NOTE — Patient Instructions (Addendum)
Medication Instructions:  Your physician recommends that you continue on your current medications as directed. Please refer to the Current Medication list given to you today.  *If you need a refill on your cardiac medications before your next appointment, please call your pharmacy*   Lab Work: Lipid panel- today If you have labs (blood work) drawn today and your tests are completely normal, you will receive your results only by: [place] (if you have MyChart) OR A paper copy in the mail If you have any lab test that is abnormal or we need to change your treatment, we will call you to review the results.   Testing/Procedures: None Ordered   Follow-Up: At [facility], you and your health needs are our priority.  As part of our continuing mission to provide you with exceptional heart care, we have created designated Provider Care Teams.  These Care Teams include your primary Cardiologist (physician) and Advanced Practice Providers (APPs -  Physician Assistants and Nurse Practitioners) who all work together to provide you with the care you need, when you need it.  We recommend signing up for the patient portal called "MyChart".  Sign up information is provided on this After Visit Summary.  MyChart is used to connect with patients for Virtual Visits (Telemedicine).  Patients are able to view lab/test results, encounter notes, upcoming appointments, etc.  Non-urgent messages can be sent to your provider as well.   To learn more about what you can do with MyChart, go to [email or web address].    Your next appointment:   6 month(s)  The format for your next appointment:   In Person  Provider:   Jenne Campus, MD    Other Instructions NA

## 2022-02-02 LAB — LIPID PANEL
Chol/HDL Ratio: 2.7 ratio (ref 0.0–5.0)
Cholesterol, Total: 114 mg/dL (ref 100–199)
HDL: 43 mg/dL (ref >39)
LDL Chol Calc (NIH): 54 mg/dL (ref 0–99)
Triglycerides: 87 mg/dL (ref 0–149)
VLDL Cholesterol Cal: 17 mg/dL (ref 5–40)

## 2022-02-03 DIAGNOSIS — G4733 Obstructive sleep apnea (adult) (pediatric): Secondary | ICD-10-CM | POA: Diagnosis not present

## 2022-02-03 DIAGNOSIS — R0982 Postnasal drip: Secondary | ICD-10-CM | POA: Diagnosis not present

## 2022-02-03 DIAGNOSIS — J301 Allergic rhinitis due to pollen: Secondary | ICD-10-CM | POA: Diagnosis not present

## 2022-02-03 DIAGNOSIS — J4540 Moderate persistent asthma, uncomplicated: Secondary | ICD-10-CM | POA: Diagnosis not present

## 2022-02-03 DIAGNOSIS — I509 Heart failure, unspecified: Secondary | ICD-10-CM | POA: Diagnosis not present

## 2022-02-03 DIAGNOSIS — R918 Other nonspecific abnormal finding of lung field: Secondary | ICD-10-CM | POA: Diagnosis not present

## 2022-02-06 ENCOUNTER — Telehealth: Payer: Self-pay

## 2022-02-06 NOTE — Telephone Encounter (Signed)
Patient notified of results via mychart

## 2022-02-06 NOTE — Telephone Encounter (Signed)
-----   Message from Park Liter, MD sent at 02/03/2022  9:26 AM EDT ----- Cholesterol is excellent, continue present management

## 2022-02-16 DIAGNOSIS — J439 Emphysema, unspecified: Secondary | ICD-10-CM | POA: Diagnosis not present

## 2022-02-16 DIAGNOSIS — R918 Other nonspecific abnormal finding of lung field: Secondary | ICD-10-CM | POA: Diagnosis not present

## 2022-03-03 DIAGNOSIS — G4733 Obstructive sleep apnea (adult) (pediatric): Secondary | ICD-10-CM | POA: Diagnosis not present

## 2022-03-03 DIAGNOSIS — I509 Heart failure, unspecified: Secondary | ICD-10-CM | POA: Diagnosis not present

## 2022-03-03 DIAGNOSIS — J301 Allergic rhinitis due to pollen: Secondary | ICD-10-CM | POA: Diagnosis not present

## 2022-03-03 DIAGNOSIS — J4540 Moderate persistent asthma, uncomplicated: Secondary | ICD-10-CM | POA: Diagnosis not present

## 2022-03-03 DIAGNOSIS — R0982 Postnasal drip: Secondary | ICD-10-CM | POA: Diagnosis not present

## 2022-03-03 DIAGNOSIS — R918 Other nonspecific abnormal finding of lung field: Secondary | ICD-10-CM | POA: Diagnosis not present

## 2022-03-08 DIAGNOSIS — Z6839 Body mass index (BMI) 39.0-39.9, adult: Secondary | ICD-10-CM | POA: Diagnosis not present

## 2022-03-08 DIAGNOSIS — D3500 Benign neoplasm of unspecified adrenal gland: Secondary | ICD-10-CM | POA: Diagnosis not present

## 2022-03-08 DIAGNOSIS — E785 Hyperlipidemia, unspecified: Secondary | ICD-10-CM | POA: Diagnosis not present

## 2022-03-08 DIAGNOSIS — I110 Hypertensive heart disease with heart failure: Secondary | ICD-10-CM | POA: Diagnosis not present

## 2022-03-08 DIAGNOSIS — R739 Hyperglycemia, unspecified: Secondary | ICD-10-CM | POA: Diagnosis not present

## 2022-03-08 DIAGNOSIS — I2510 Atherosclerotic heart disease of native coronary artery without angina pectoris: Secondary | ICD-10-CM | POA: Diagnosis not present

## 2022-03-08 DIAGNOSIS — Z9181 History of falling: Secondary | ICD-10-CM | POA: Diagnosis not present

## 2022-03-10 DIAGNOSIS — E875 Hyperkalemia: Secondary | ICD-10-CM | POA: Diagnosis not present

## 2022-06-09 DIAGNOSIS — I110 Hypertensive heart disease with heart failure: Secondary | ICD-10-CM | POA: Diagnosis not present

## 2022-06-09 DIAGNOSIS — E785 Hyperlipidemia, unspecified: Secondary | ICD-10-CM | POA: Diagnosis not present

## 2022-06-09 DIAGNOSIS — Z6841 Body Mass Index (BMI) 40.0 and over, adult: Secondary | ICD-10-CM | POA: Diagnosis not present

## 2022-06-09 DIAGNOSIS — M25562 Pain in left knee: Secondary | ICD-10-CM | POA: Diagnosis not present

## 2022-06-09 DIAGNOSIS — R739 Hyperglycemia, unspecified: Secondary | ICD-10-CM | POA: Diagnosis not present

## 2022-06-09 DIAGNOSIS — J449 Chronic obstructive pulmonary disease, unspecified: Secondary | ICD-10-CM | POA: Diagnosis not present

## 2022-06-09 DIAGNOSIS — F5104 Psychophysiologic insomnia: Secondary | ICD-10-CM | POA: Diagnosis not present

## 2022-06-09 DIAGNOSIS — I2510 Atherosclerotic heart disease of native coronary artery without angina pectoris: Secondary | ICD-10-CM | POA: Diagnosis not present

## 2022-06-10 LAB — LAB REPORT - SCANNED
A1c: 5.8
EGFR: 91

## 2022-07-27 ENCOUNTER — Other Ambulatory Visit: Payer: Self-pay | Admitting: Cardiology

## 2022-07-28 ENCOUNTER — Other Ambulatory Visit: Payer: Self-pay | Admitting: Cardiology

## 2022-09-13 DIAGNOSIS — Z6841 Body Mass Index (BMI) 40.0 and over, adult: Secondary | ICD-10-CM | POA: Diagnosis not present

## 2022-09-13 DIAGNOSIS — F5104 Psychophysiologic insomnia: Secondary | ICD-10-CM | POA: Diagnosis not present

## 2022-09-13 DIAGNOSIS — I700 Atherosclerosis of aorta: Secondary | ICD-10-CM | POA: Diagnosis not present

## 2022-09-13 DIAGNOSIS — R739 Hyperglycemia, unspecified: Secondary | ICD-10-CM | POA: Diagnosis not present

## 2022-09-13 DIAGNOSIS — J449 Chronic obstructive pulmonary disease, unspecified: Secondary | ICD-10-CM | POA: Diagnosis not present

## 2022-09-13 DIAGNOSIS — M1712 Unilateral primary osteoarthritis, left knee: Secondary | ICD-10-CM | POA: Diagnosis not present

## 2022-09-13 DIAGNOSIS — I2510 Atherosclerotic heart disease of native coronary artery without angina pectoris: Secondary | ICD-10-CM | POA: Diagnosis not present

## 2022-09-13 DIAGNOSIS — I110 Hypertensive heart disease with heart failure: Secondary | ICD-10-CM | POA: Diagnosis not present

## 2022-09-13 DIAGNOSIS — E785 Hyperlipidemia, unspecified: Secondary | ICD-10-CM | POA: Diagnosis not present

## 2022-11-17 ENCOUNTER — Other Ambulatory Visit: Payer: Self-pay

## 2022-11-20 ENCOUNTER — Ambulatory Visit: Payer: Medicare Other | Attending: Cardiology | Admitting: Cardiology

## 2022-11-20 ENCOUNTER — Encounter: Payer: Self-pay | Admitting: Cardiology

## 2022-11-20 VITALS — BP 132/76 | HR 81 | Ht 66.6 in | Wt 248.6 lb

## 2022-11-20 DIAGNOSIS — E119 Type 2 diabetes mellitus without complications: Secondary | ICD-10-CM | POA: Insufficient documentation

## 2022-11-20 DIAGNOSIS — I2541 Coronary artery aneurysm: Secondary | ICD-10-CM | POA: Insufficient documentation

## 2022-11-20 DIAGNOSIS — R0609 Other forms of dyspnea: Secondary | ICD-10-CM | POA: Insufficient documentation

## 2022-11-20 DIAGNOSIS — I10 Essential (primary) hypertension: Secondary | ICD-10-CM | POA: Diagnosis not present

## 2022-11-20 DIAGNOSIS — E782 Mixed hyperlipidemia: Secondary | ICD-10-CM | POA: Insufficient documentation

## 2022-11-20 DIAGNOSIS — I2510 Atherosclerotic heart disease of native coronary artery without angina pectoris: Secondary | ICD-10-CM | POA: Insufficient documentation

## 2022-11-20 DIAGNOSIS — I110 Hypertensive heart disease with heart failure: Secondary | ICD-10-CM | POA: Diagnosis not present

## 2022-11-20 NOTE — Progress Notes (Signed)
Cardiology Office Note:    Date:  11/20/2022   ID:  Phillip Dunn, DOB January 11, 1951, MRN [id number]  PCP:  Paulina Fusi, MD  Cardiologist:  Gypsy Balsam, MD    Referring MD: Paulina Fusi, MD   No chief complaint on file.   History of Present Illness:    Phillip Dunn is a 72 y.o. male past medical history significant for coronary artery disease status post coronary bypass graft done many years ago.  PGR 2020 he end up coming to the hospital with non-STEMI.  I probably what triggered this event was the fact that he is dual antiplatelets therapy has been interrupted for back injection, he was find to have total occlusion of first obtuse marginal branch however he unsuccessful in opening it.  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  Ejection fraction lower limits of normal, essential hypertension, dyslipidemia, obstructive sleep apnea. Comes today to months for follow-up overall she is doing fair.  For the last week she has been struggling with cold getting better still coughing some but no shortness of breath.  He thinks he caught it from his daughter who ended up having stroke.  Likely she is recovering quite nicely.  Denies have any chest pain tightness squeezing pressure burning chest  Past Medical History:  Diagnosis Date   Abnormal endocrine laboratory test finding 08/02/2021   Adrenal disorder (HCC)    Asthma    Benign hypertensive heart disease with CHF (congestive heart failure) (HCC)    Bradycardia    Class 2 severe obesity with serious comorbidity and body mass index (BMI) of 39.0 to 39.9 in adult [facility]) 08/08/2021   Coronary artery aneurysm 06/19/2017   Measuring 14 mm on CT from February 2019   Coronary artery disease    Coronary artery disease involving native coronary artery of native heart without angina pectoris 12/08/2014   Overview:  Multiple stents to circumflex artery in March 18, 2014  Formatting of this note might be different from the  original. 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   aspirin EC 81 MG tablet Take 81 mg by mouth daily.   atorvastatin (LIPITOR) 80 MG tablet Take 80 mg by mouth daily.   clopidogrel (PLAVIX) 75 MG tablet Take 75 mg by mouth daily.   Coenzyme Q10 (COQ-10) 10 MG CAPS Take 10 mg by mouth daily.   finasteride (PROSCAR) 5 MG tablet Take 5 mg by mouth daily.   fluticasone (FLONASE) 50 MCG/ACT nasal spray Place 2 sprays into both nostrils daily.   furosemide (LASIX) 40 MG tablet Take 60 mg  by mouth daily.   IPRATROPIUM BROMIDE IN Place 1 spray into both nostrils 2 (two) times daily.   KRILL OIL PO Take 1,000 mg by mouth daily.   lisinopril (ZESTRIL) 2.5 MG tablet Take 2.5 mg by mouth daily.   montelukast (SINGULAIR) 10 MG tablet Take 10 mg by mouth daily.   OVER THE COUNTER MEDICATION Take 1 tablet by mouth daily. Unknown strength   tamsulosin (FLOMAX) 0.4 MG CAPS capsule Take 0.4 mg by mouth daily.   zolpidem (AMBIEN) 10 MG tablet Take 10 mg by mouth at bedtime as needed for sleep.   [DISCONTINUED] atorvastatin (LIPITOR) 80 MG tablet TAKE 1 TABLET EVERY  DAY (Patient taking differently: Take 80 mg by mouth daily.)   [DISCONTINUED] clopidogrel (PLAVIX) 75 MG tablet TAKE 1 TABLET EVERY DAY   [DISCONTINUED] finasteride (PROSCAR) 5 MG tablet TAKE 1 TABLET EVERY DAY   [DISCONTINUED] furosemide (LASIX) 40 MG tablet TAKE 1 AND 1/2 TABLETS EVERY DAY (Patient taking differently: Take 20 mg by mouth daily.)   [DISCONTINUED] lisinopril (ZESTRIL) 2.5 MG tablet TAKE 1 TABLET EVERY DAY (Patient taking differently: Take 2.5 mg by mouth daily.)     Allergies:   Other and Pollen extract   Social History   Socioeconomic History   Marital status: Married    Spouse name: Not on file   Number of children: Not on file   Years of education: Not on file   Highest education level: Not on file  Occupational History   Not on file  Tobacco Use   Smoking status: Former    Types: Cigarettes   Smokeless tobacco: Never  Vaping Use   Vaping status: Former  Substance and Sexual Activity   Alcohol use: Yes    Alcohol/week: 2.0 - 3.0 standard drinks of alcohol    Types: 2 - 3 Glasses of wine per week    Comment: daily   Drug use: No   Sexual activity: Not on file  Other Topics Concern   Not on file  Social History Narrative   Not on file   Social Determinants of Health   Financial Resource Strain: Not on file  Food Insecurity: Not on file  Transportation Needs: Not on file  Physical Activity: Not on file  Stress: Not on file  Social Connections: Not on file     Family History: The patient's family history includes Stroke in his mother. ROS:   Please see the history of present illness.    All 14 point review of systems negative except as described per history of present illness  EKGs/Labs/Other Studies Reviewed:         Recent Labs: No results found for requested labs within last 365 days.  Recent Lipid Panel    Component Value Date/Time   CHOL 114 02/01/2022 1546   TRIG 87 02/01/2022 1546   HDL 43 02/01/2022 1546   CHOLHDL 2.7  02/01/2022 1546   LDLCALC 54 02/01/2022 1546    Physical Exam:    VS:  BP 132/76   Pulse 81   Ht 5' 6.6" (1.692 m)   Wt 248 lb 9.6 oz (112.8 kg)   SpO2 90%   BMI 39.41 kg/m     Wt Readings from Last 3 Encounters:  11/20/22 248 lb 9.6 oz (112.8 kg)  02/01/22 254 lb 12.8 oz (115.6 kg)  07/19/21 256 lb 6.4 oz (116.3 kg)     GEN:  Well nourished, well developed in no acute distress HEENT: Normal NECK: No JVD; No  carotid bruits LYMPHATICS: No lymphadenopathy CARDIAC: RRR, no murmurs, no rubs, no gallops RESPIRATORY:  Clear to auscultation without rales, wheezing or rhonchi  ABDOMEN: Soft, non-tender, non-distended MUSCULOSKELETAL:  No edema; No deformity  SKIN: Warm and dry LOWER EXTREMITIES: no swelling NEUROLOGIC:  Alert and oriented x 3 PSYCHIATRIC:  Normal affect   ASSESSMENT:    1. Essential hypertension   2. Benign hypertensive heart disease with CHF (congestive heart failure) (HCC)   3. Coronary artery aneurysm   4. Coronary artery disease involving native coronary artery of native heart without angina pectoris   5. Diabetes mellitus without complication (HCC)   6. Mixed hyperlipidemia    PLAN:    In order of problems listed above:  Coronary disease stable from that point review on appropriate dual antiplatelet therapy which I will continue because of aneurysm in the coronaries. Essential hypertension blood pressure well-controlled continue present management. Dyslipidemia I did review his K PN which show me his LDL of 56 HDL 44.  Will continue present management which include Lipitor 80 which is high intense statin. Diabetes that being followed by antimedicine team, hemoglobin A1c from 09/13/2022 is 5.9 continue present management   Medication Adjustments/Labs and Tests Ordered: Current medicines are reviewed at length with the patient today.  Concerns regarding medicines are outlined above.  Orders Placed This Encounter  Procedures   EKG 12-Lead    Medication changes: No orders of the defined types were placed in this encounter.   Signed, Georgeanna Lea, MD, [facility] 11/20/2022 2:49 PM    [facility]

## 2022-11-20 NOTE — Addendum Note (Signed)
Addended by: Baldo Ash D on: 11/20/2022 02:54 PM   Modules accepted: Orders

## 2022-11-20 NOTE — Patient Instructions (Signed)
Medication Instructions:  Your physician recommends that you continue on your current medications as directed. Please refer to the Current Medication list given to you today.  *If you need a refill on your cardiac medications before your next appointment, please call your pharmacy*   Lab Work: None Ordered If you have labs (blood work) drawn today and your tests are completely normal, you will receive your results only by: MyChart Message (if you have MyChart) OR A paper copy in the mail If you have any lab test that is abnormal or we need to change your treatment, we will call you to review the results.   Testing/Procedures: Your physician has requested that you have an echocardiogram. Echocardiography is a painless test that uses sound waves to create images of your heart. It provides your doctor with information about the size and shape of your heart and how well your heart's chambers and valves are working. This procedure takes approximately one hour. There are no restrictions for this procedure. Please do NOT wear cologne, perfume, aftershave, or lotions (deodorant is allowed). Please arrive 15 minutes prior to your appointment time.      Follow-Up: At [facility], you and your health needs are our priority.  As part of our continuing mission to provide you with exceptional heart care, we have created designated Provider Care Teams.  These Care Teams include your primary Cardiologist (physician) and Advanced Practice Providers (APPs -  Physician Assistants and Nurse Practitioners) who all work together to provide you with the care you need, when you need it.  We recommend signing up for the patient portal called "MyChart".  Sign up information is provided on this After Visit Summary.  MyChart is used to connect with patients for Virtual Visits (Telemedicine).  Patients are able to view lab/test results, encounter notes, upcoming appointments, etc.  Non-urgent messages can be sent to  your provider as well.   To learn more about what you can do with MyChart, go to [email or web address].    Your next appointment:   6 month(s)  The format for your next appointment:   In Person  Provider:   Robert Krasowski, MD    Other Instructions NA  

## 2022-11-27 DIAGNOSIS — R5383 Other fatigue: Secondary | ICD-10-CM | POA: Diagnosis not present

## 2022-11-27 DIAGNOSIS — M791 Myalgia, unspecified site: Secondary | ICD-10-CM | POA: Diagnosis not present

## 2022-11-27 DIAGNOSIS — R509 Fever, unspecified: Secondary | ICD-10-CM | POA: Diagnosis not present

## 2022-12-21 DIAGNOSIS — F5104 Psychophysiologic insomnia: Secondary | ICD-10-CM | POA: Diagnosis not present

## 2022-12-21 DIAGNOSIS — I700 Atherosclerosis of aorta: Secondary | ICD-10-CM | POA: Diagnosis not present

## 2022-12-21 DIAGNOSIS — I110 Hypertensive heart disease with heart failure: Secondary | ICD-10-CM | POA: Diagnosis not present

## 2022-12-21 DIAGNOSIS — I2510 Atherosclerotic heart disease of native coronary artery without angina pectoris: Secondary | ICD-10-CM | POA: Diagnosis not present

## 2022-12-21 DIAGNOSIS — J449 Chronic obstructive pulmonary disease, unspecified: Secondary | ICD-10-CM | POA: Diagnosis not present

## 2022-12-21 DIAGNOSIS — E785 Hyperlipidemia, unspecified: Secondary | ICD-10-CM | POA: Diagnosis not present

## 2022-12-21 DIAGNOSIS — R739 Hyperglycemia, unspecified: Secondary | ICD-10-CM | POA: Diagnosis not present

## 2022-12-21 DIAGNOSIS — Z6841 Body Mass Index (BMI) 40.0 and over, adult: Secondary | ICD-10-CM | POA: Diagnosis not present

## 2022-12-21 DIAGNOSIS — Z125 Encounter for screening for malignant neoplasm of prostate: Secondary | ICD-10-CM | POA: Diagnosis not present

## 2022-12-26 ENCOUNTER — Ambulatory Visit: Payer: Medicare Other | Attending: Cardiology

## 2022-12-26 DIAGNOSIS — R0609 Other forms of dyspnea: Secondary | ICD-10-CM | POA: Diagnosis not present

## 2022-12-26 LAB — ECHOCARDIOGRAM COMPLETE
AR max vel: 1.68 cm2
AV Area VTI: 1.74 cm2
AV Area mean vel: 1.63 cm2
AV Mean grad: 13 mmHg
AV Peak grad: 20.3 mmHg
Ao pk vel: 2.25 m/s
Area-P 1/2: 4.1 cm2
MV M vel: 4.8 m/s
MV Peak grad: 92.2 mmHg
S' Lateral: 4.2 cm

## 2023-01-08 DIAGNOSIS — S39012A Strain of muscle, fascia and tendon of lower back, initial encounter: Secondary | ICD-10-CM | POA: Diagnosis not present

## 2023-01-12 IMAGING — CT CT ABD-PEL WO/W CM
3 of 10 series · 11 of 46 positions shown, 17 images · IV contrast (iopamidol)
Comparison: Cardiac CT 11/26/2017, chest CT 05/11/2017 and report
only from remote chest CT 11/19/2009

CLINICAL DATA: Follow-up adrenal mass.  Hematuria.

EXAM:
CT ABDOMEN AND PELVIS WITHOUT AND WITH CONTRAST
TECHNIQUE: Multidetector CT imaging of the abdomen and pelvis was performed
following the standard protocol before and following the bolus
administration of intravenous contrast.
CONTRAST:  100mL X33B4L-MBB IOPAMIDOL (X33B4L-MBB) INJECTION 61%

[Series 5: adrenals 3.00 br40 s3 ax without · axial · non-contrast · 0.93mm/px · z∈[+1408,+1468]mm · 2 of 81 slices shown]
[im 21/81  soft-tissue]
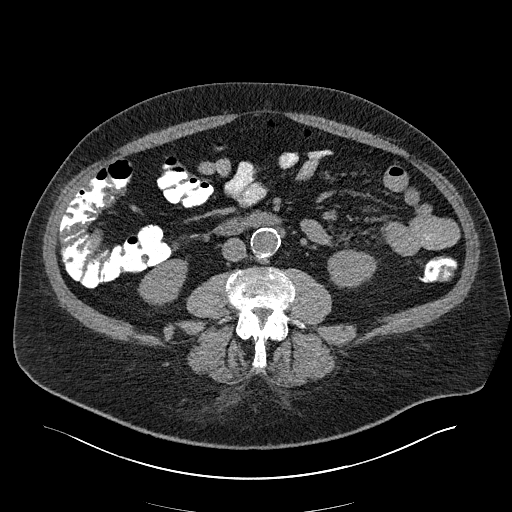
[im 41/81  soft-tissue]
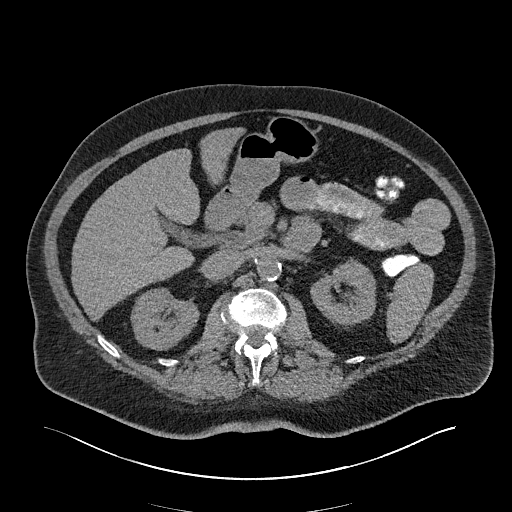

[Series 6: adrenals portal venous 3.00 br40 s3 axial cm · axial · portal-venous · 0.94mm/px · z∈[+1232,+1517]mm · 6 of 135 slices shown, 11 images]
[im 20/135  soft-tissue]
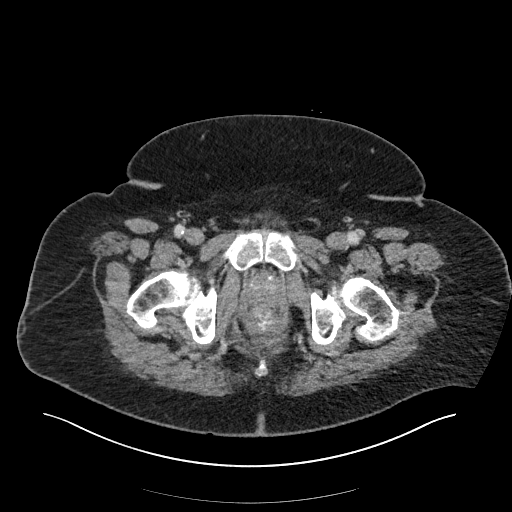
[im 20/135  bone]
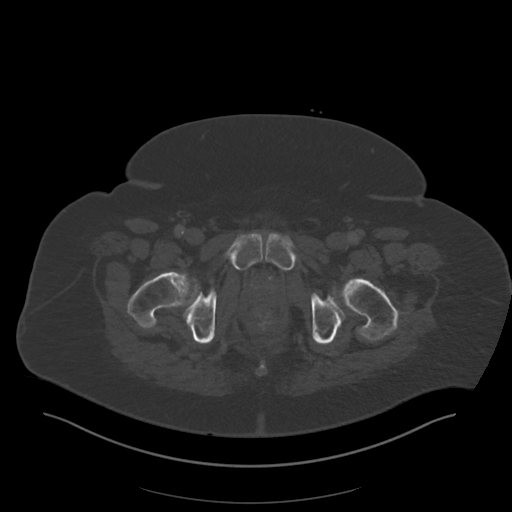
[im 39/135  soft-tissue]
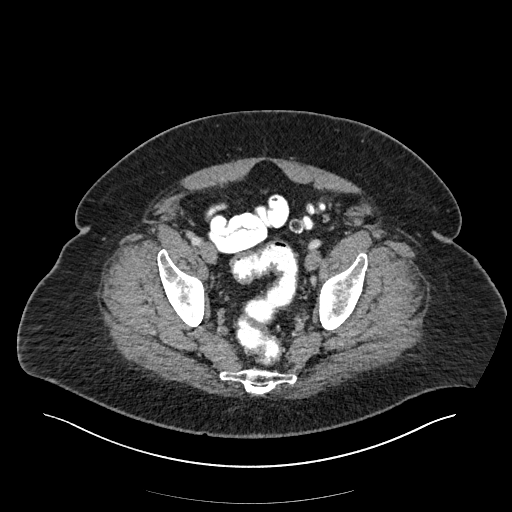
[im 58/135  soft-tissue]
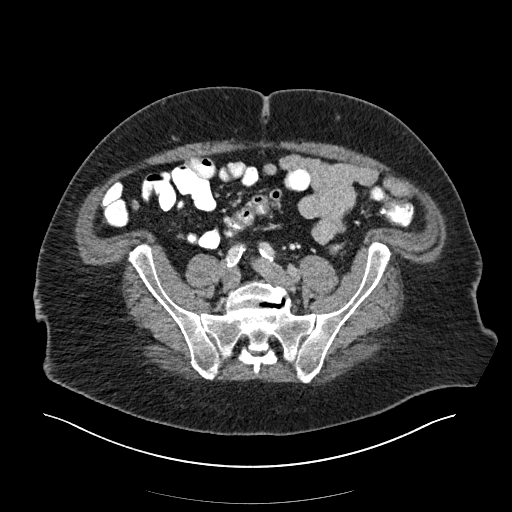
[im 58/135  lung]
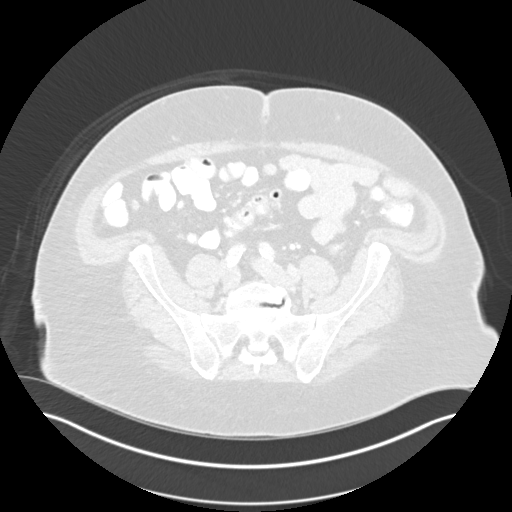
[im 77/135  soft-tissue]
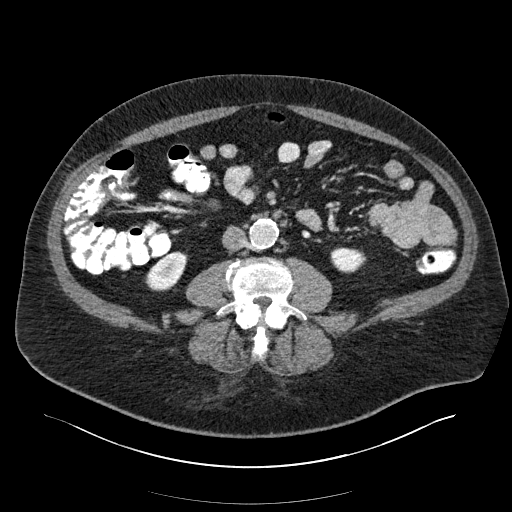
[im 77/135  lung]
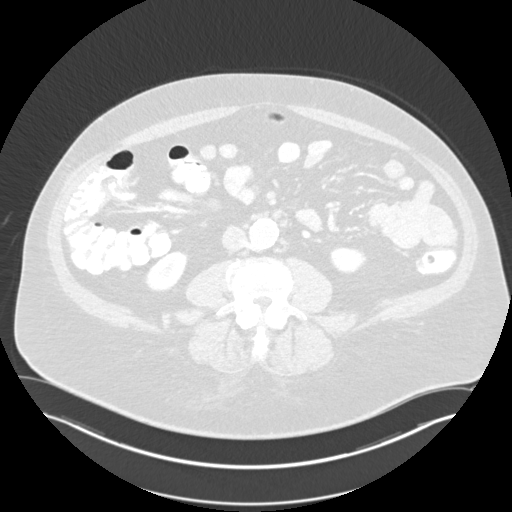
[im 96/135  soft-tissue]
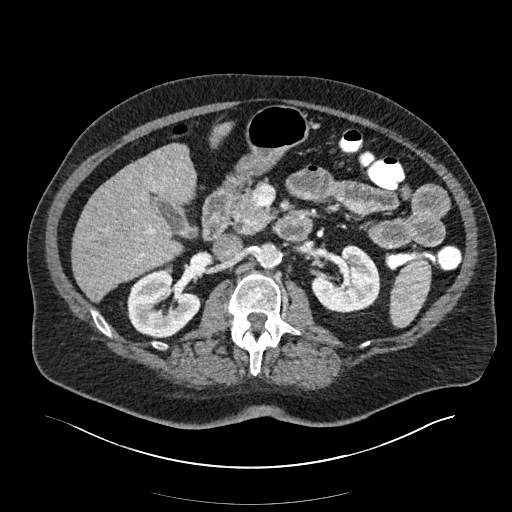
[im 96/135  lung]
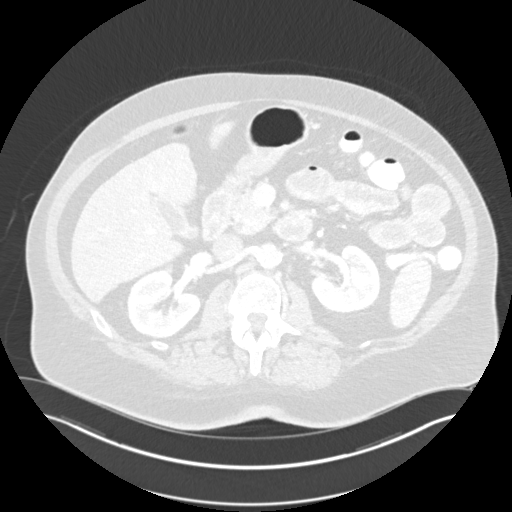
[im 115/135  soft-tissue]
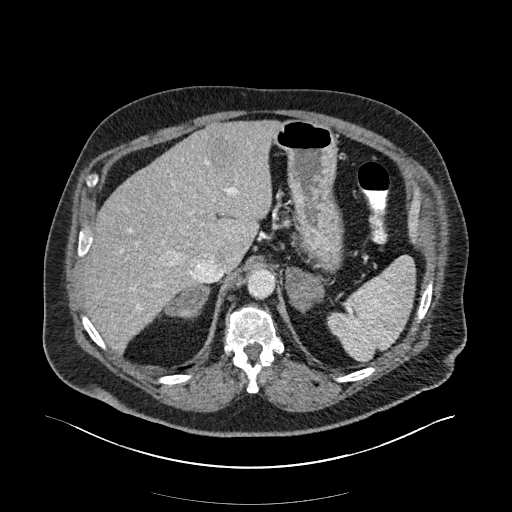
[im 115/135  lung]
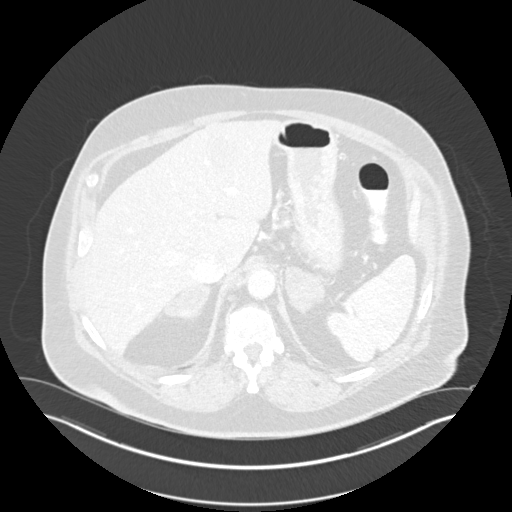

[Series 8: adrenals portal venous 3.00 br40 s3 cor cm · coronal · portal-venous · 0.80mm/px · 3 of 160 slices shown, 4 images]
[im 40/160  soft-tissue]
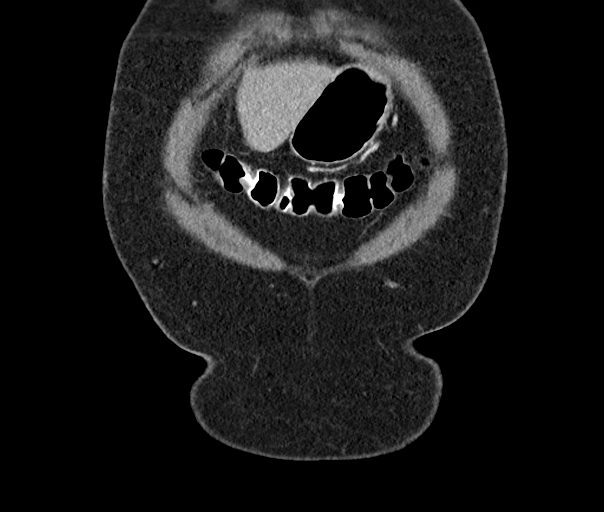
[im 80/160  soft-tissue]
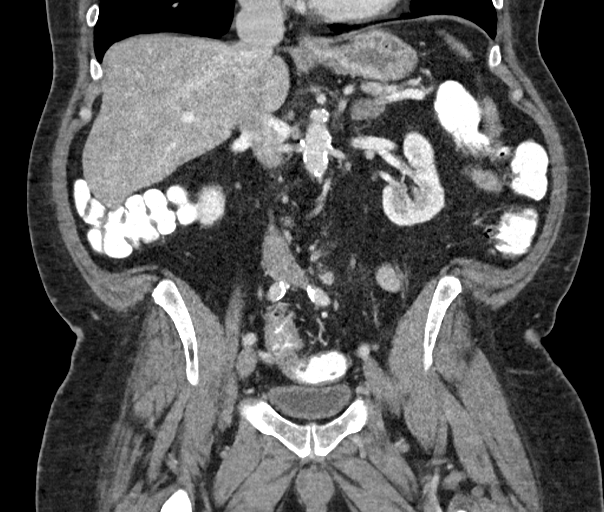
[im 80/160  bone]
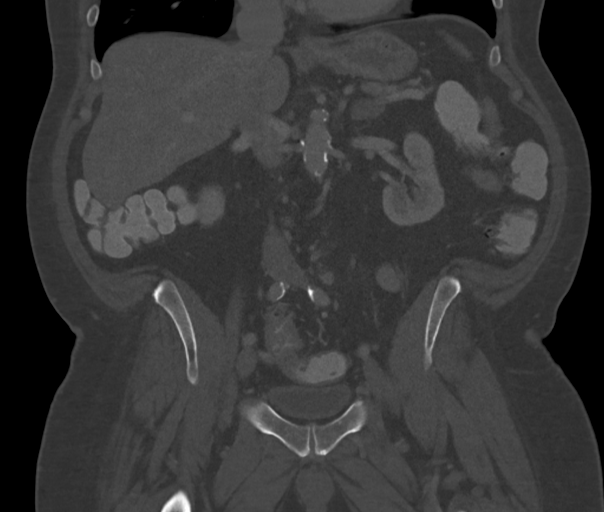
[im 120/160  soft-tissue]
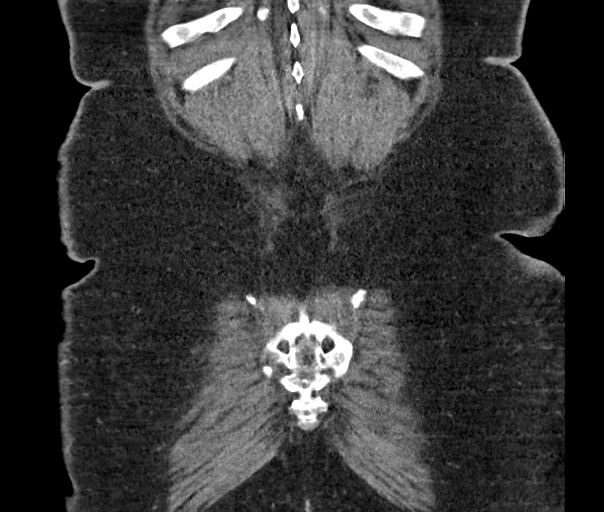

[11 of 46 positions shown; findings below may reference images not displayed]

FINDINGS: Lower chest: Clear lung bases. No significant pleural or pericardial
effusion.

Hepatobiliary: There is ill-defined low-density in the left hepatic
lobe on the early portal phase images which likely reflects focal
fat. This measures approximately 3.9 x 2.9 cm on image [DATE] and is
not well seen on the delayed images. There is an adjacent 8 mm
low-density lesion on image [DATE] which is likely a cyst. No highly
suspicious hepatic findings are seen. No evidence of gallstones,
gallbladder wall thickening or biliary dilatation.

Pancreas: Unremarkable. No pancreatic ductal dilatation or
surrounding inflammatory changes.

Spleen: Normal in size without focal abnormality.

Adrenals/Urinary Tract: Stable low-density enlargement of both
adrenal glands. The right adrenal gland measures 4.8 x 2.5 cm and
has a density 5 HU prior to contrast administration. The left
adrenal gland measures 3.9 x 3.4 cm and has a density 9.8 HU prior
to contrast administration. Absolute and relative washout
characteristics following contrast are consistent with adenomas. Pre
contrast images demonstrate no renal or proximal ureteral calculus.
Following contrast, no evidence of enhancing renal mass. There are
small low-density right renal lesions consistent with cysts. There
is no hydronephrosis or delay in contrast excretion. The bladder
appears unremarkable for its degree of distention.

Stomach/Bowel: Enteric contrast was administered and has passed to
the rectum. The stomach appears unremarkable for its degree of
distension. No evidence of bowel wall thickening, distention or
surrounding inflammatory change. The appendix appears normal. There
are scattered diverticula throughout the descending and sigmoid
colon.

Vascular/Lymphatic: There are no enlarged abdominal or pelvic lymph
nodes. Aortic and branch vessel atherosclerosis without aneurysm.

Reproductive: Dystrophic calcifications within the prostate gland.

Other: No evidence of abdominal wall mass or hernia. No ascites.

Musculoskeletal: No acute or significant osseous findings.
Multilevel spondylosis noted.
IMPRESSION: 1. No explanation for hematuria identified. There is no evidence of
urinary tract calculus, renal mass or hydronephrosis.
2. Stable bilateral adrenal adenomas from prior chest CT of 1180.
3. Indeterminate low-density in the left hepatic lobe, probably
focal fat. Recommend further evaluation with abdominal MRI with and
without contrast.
4. Distal colonic diverticulosis.
5.  Aortic Atherosclerosis ([id number]-Q43.3).

## 2023-01-17 DIAGNOSIS — S29012A Strain of muscle and tendon of back wall of thorax, initial encounter: Secondary | ICD-10-CM

## 2023-01-17 DIAGNOSIS — S29019A Strain of muscle and tendon of unspecified wall of thorax, initial encounter: Secondary | ICD-10-CM

## 2023-01-17 DIAGNOSIS — M545 Low back pain, unspecified: Secondary | ICD-10-CM | POA: Insufficient documentation

## 2023-01-17 DIAGNOSIS — M546 Pain in thoracic spine: Secondary | ICD-10-CM | POA: Diagnosis not present

## 2023-01-17 HISTORY — DX: Strain of muscle and tendon of unspecified wall of thorax, initial encounter: S29.019A

## 2023-01-17 HISTORY — DX: Low back pain, unspecified: M54.50

## 2023-01-17 HISTORY — DX: Strain of muscle and tendon of back wall of thorax, initial encounter: S29.012A

## 2023-03-03 ENCOUNTER — Other Ambulatory Visit: Payer: Self-pay | Admitting: Cardiology

## 2023-03-05 ENCOUNTER — Other Ambulatory Visit: Payer: Self-pay

## 2023-03-05 NOTE — Telephone Encounter (Signed)
Rx SENT

## 2023-03-11 ENCOUNTER — Other Ambulatory Visit: Payer: Self-pay | Admitting: Cardiology

## 2023-03-19 DIAGNOSIS — J301 Allergic rhinitis due to pollen: Secondary | ICD-10-CM | POA: Diagnosis not present

## 2023-03-19 DIAGNOSIS — J4540 Moderate persistent asthma, uncomplicated: Secondary | ICD-10-CM | POA: Diagnosis not present

## 2023-03-19 DIAGNOSIS — G4733 Obstructive sleep apnea (adult) (pediatric): Secondary | ICD-10-CM | POA: Diagnosis not present

## 2023-03-19 DIAGNOSIS — I509 Heart failure, unspecified: Secondary | ICD-10-CM | POA: Diagnosis not present

## 2023-03-26 DIAGNOSIS — Z6841 Body Mass Index (BMI) 40.0 and over, adult: Secondary | ICD-10-CM | POA: Diagnosis not present

## 2023-03-26 DIAGNOSIS — R739 Hyperglycemia, unspecified: Secondary | ICD-10-CM | POA: Diagnosis not present

## 2023-03-26 DIAGNOSIS — E785 Hyperlipidemia, unspecified: Secondary | ICD-10-CM | POA: Diagnosis not present

## 2023-03-26 DIAGNOSIS — I700 Atherosclerosis of aorta: Secondary | ICD-10-CM | POA: Diagnosis not present

## 2023-03-26 DIAGNOSIS — Z5320 Procedure and treatment not carried out because of patient's decision for unspecified reasons: Secondary | ICD-10-CM | POA: Diagnosis not present

## 2023-03-26 DIAGNOSIS — J449 Chronic obstructive pulmonary disease, unspecified: Secondary | ICD-10-CM | POA: Diagnosis not present

## 2023-03-26 DIAGNOSIS — I110 Hypertensive heart disease with heart failure: Secondary | ICD-10-CM | POA: Diagnosis not present

## 2023-03-29 DIAGNOSIS — E875 Hyperkalemia: Secondary | ICD-10-CM | POA: Diagnosis not present

## 2023-05-25 ENCOUNTER — Ambulatory Visit: Payer: Medicare Other | Attending: Cardiology | Admitting: Cardiology

## 2023-05-25 VITALS — BP 110/60 | HR 74 | Ht 67.5 in | Wt 259.8 lb

## 2023-05-25 DIAGNOSIS — I110 Hypertensive heart disease with heart failure: Secondary | ICD-10-CM | POA: Diagnosis not present

## 2023-05-25 DIAGNOSIS — G4733 Obstructive sleep apnea (adult) (pediatric): Secondary | ICD-10-CM | POA: Insufficient documentation

## 2023-05-25 DIAGNOSIS — E785 Hyperlipidemia, unspecified: Secondary | ICD-10-CM | POA: Insufficient documentation

## 2023-05-25 DIAGNOSIS — I2541 Coronary artery aneurysm: Secondary | ICD-10-CM | POA: Insufficient documentation

## 2023-05-25 DIAGNOSIS — E782 Mixed hyperlipidemia: Secondary | ICD-10-CM | POA: Diagnosis not present

## 2023-05-25 NOTE — Patient Instructions (Signed)

## 2023-05-25 NOTE — Progress Notes (Signed)
 Cardiology Office Note:    Date:  05/25/2023   ID:  Phillip Dunn, DOB 1950/06/17, MRN [id number]  PCP:  Paulina Fusi, MD  Cardiologist:  Gypsy Balsam, MD    Referring MD: Paulina Fusi, MD   Chief Complaint  Patient presents with   Follow-up   muscle cramps    History of Present Illness:    Phillip Dunn is a 73 y.o. male  past medical history significant for coronary artery disease status post coronary bypass graft done many years ago. PGR 2020 he end up coming to the hospital with non-STEMI. I probably what triggered this event was the fact that he is dual antiplatelets therapy has been interrupted for back injection, he was find to have total occlusion of first obtuse marginal branch however he unsuccessful in opening it. 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 Ejection fraction lower limits of normal, essential hypertension, dyslipidemia, obstructive sleep apnea.  Comes today to months for follow-up overall cardiac wise doing well.  Denies having chest pain tightness squeezing pressure burning chest.  He does have having leg cramps as well as hand cramps.  His electrolytes are fine.  Past Medical History:  Diagnosis Date   Abnormal endocrine laboratory test finding 08/02/2021   Adrenal disorder (HCC)    Asthma    Benign hypertensive heart disease with CHF (congestive heart failure) (HCC)    Bradycardia    Class 2 severe obesity with serious comorbidity and body mass index (BMI) of 39.0 to 39.9 in adult [facility]) 08/08/2021   Coronary artery aneurysm 06/19/2017   Measuring 14 mm on CT from February 2019   Coronary artery disease    Coronary artery disease involving native coronary artery of native heart without angina pectoris 12/08/2014   Overview:  Multiple stents to circumflex artery in March 18, 2014  Formatting of this note might be different from the original. 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   aspirin EC 81 MG tablet Take 81 mg by mouth daily.   atorvastatin (LIPITOR) 80 MG tablet Take 80 mg by mouth daily.   clopidogrel (PLAVIX) 75 MG tablet TAKE 1 TABLET EVERY DAY   Coenzyme Q10 (COQ-10) 10 MG CAPS Take 10 mg by mouth daily.   finasteride (PROSCAR) 5 MG tablet Take 1 tablet (5 mg total) by mouth daily.   fluticasone (FLONASE) 50 MCG/ACT nasal spray Place 2 sprays into both nostrils daily.   fluticasone-salmeterol (ADVAIR) 250-50 MCG/ACT AEPB Inhale 1 puff into the lungs in the morning and at bedtime.   furosemide (LASIX) 40 MG tablet TAKE  1 AND 1/2 TABLETS EVERY DAY (Patient taking differently: Take 120 mg by mouth daily.)   IPRATROPIUM BROMIDE IN Place 1 spray into both nostrils 2 (two) times daily.   KRILL OIL PO Take 1,000 mg by mouth daily.   lisinopril (ZESTRIL) 2.5 MG tablet TAKE 1 TABLET EVERY DAY   montelukast (SINGULAIR) 10 MG tablet Take 10 mg by mouth daily.   OVER THE COUNTER MEDICATION Take 1 tablet by mouth daily. Tylenol ES   tamsulosin (FLOMAX) 0.4 MG CAPS capsule Take 0.4 mg by mouth daily.   zolpidem (AMBIEN) 10 MG tablet Take 10 mg by mouth at bedtime as needed  for sleep.     Allergies:   Grass pollen([id number] vern), Other, and Pollen extract   Social History   Socioeconomic History   Marital status: Married    Spouse name: Not on file   Number of children: Not on file   Years of education: Not on file   Highest education level: Not on file  Occupational History   Not on file  Tobacco Use   Smoking status: Former    Types: Cigarettes   Smokeless tobacco: Never  Vaping Use   Vaping status: Former  Substance and Sexual Activity   Alcohol use: Yes    Alcohol/week: 2.0 - 3.0 standard drinks of alcohol    Types: 2 - 3 Glasses of wine per week    Comment: daily   Drug use: No   Sexual activity: Not on file  Other Topics Concern   Not on file  Social History Narrative   Not on file   Social Drivers of Health   Financial Resource Strain: Not on file  Food Insecurity: Not on file  Transportation Needs: Not on file  Physical Activity: Not on file  Stress: Not on file  Social Connections: Not on file     Family History: The patient's family history includes Stroke in his mother. ROS:   Please see the history of present illness.    All 14 point review of systems negative except as described per history of present illness  EKGs/Labs/Other Studies Reviewed:         Recent Labs: No results found for requested labs within last 365 days.  Recent Lipid Panel    Component Value Date/Time   CHOL 114 02/01/2022 1546   TRIG 87 02/01/2022 1546   HDL 43 02/01/2022 1546   CHOLHDL 2.7 02/01/2022 1546   LDLCALC 54 02/01/2022 1546    Physical Exam:    VS:  BP 110/60 (BP Location: Right Arm, Patient Position: Sitting)   Pulse 74   Ht 5' 7.5" (1.715 m)   Wt 259 lb 12.8 oz (117.8 kg)   SpO2 92%   BMI 40.09 kg/m     Wt Readings from Last 3 Encounters:  05/25/23 259 lb 12.8 oz (117.8 kg)  11/20/22 248 lb 9.6 oz (112.8 kg)  02/01/22 254 lb 12.8 oz (115.6 kg)     GEN:  Well nourished, well developed in no acute  distress HEENT: Normal NECK: No JVD; No carotid bruits LYMPHATICS: No lymphadenopathy CARDIAC: RRR, no murmurs, no rubs, no gallops RESPIRATORY:  Clear to auscultation without rales, wheezing or rhonchi  ABDOMEN: Soft, non-tender, non-distended MUSCULOSKELETAL:  No edema; No deformity  SKIN: Warm and dry LOWER EXTREMITIES: no swelling NEUROLOGIC:  Alert and oriented x 3 PSYCHIATRIC:  Normal affect   ASSESSMENT:    1. Benign hypertensive heart disease with CHF (congestive heart failure) (HCC)  2. Coronary artery aneurysm   3. OSA (obstructive sleep apnea)   4. Dyslipidemia   5. Mixed hyperlipidemia    PLAN:    In order of problems listed above:  Coronary disease stable from that point review on guideline directed medical therapy will continue present management will continue dual antiplatelet therapy. Benign essential hypertension blood pressure well-controlled. Dyslipidemia: I did review K PN which show me LDL 60 HDL 49 we will continue present management. Obstructive sleep apnea followed by internal medicine team as well as pulmonary. Cramps ask him to try to use some tonic water   Medication Adjustments/Labs and Tests Ordered: Current medicines are reviewed at length with the patient today.  Concerns regarding medicines are outlined above.  No orders of the defined types were placed in this encounter.  Medication changes: No orders of the defined types were placed in this encounter.   Signed, Phillip Lea, MD, [facility] 05/25/2023 4:19 PM    [facility]

## 2023-06-01 DIAGNOSIS — G4733 Obstructive sleep apnea (adult) (pediatric): Secondary | ICD-10-CM | POA: Diagnosis not present

## 2023-06-01 DIAGNOSIS — J4540 Moderate persistent asthma, uncomplicated: Secondary | ICD-10-CM | POA: Diagnosis not present

## 2023-06-01 DIAGNOSIS — J301 Allergic rhinitis due to pollen: Secondary | ICD-10-CM | POA: Diagnosis not present

## 2023-06-01 DIAGNOSIS — R918 Other nonspecific abnormal finding of lung field: Secondary | ICD-10-CM | POA: Diagnosis not present

## 2023-07-05 DIAGNOSIS — M67449 Ganglion, unspecified hand: Secondary | ICD-10-CM | POA: Diagnosis not present

## 2023-07-05 DIAGNOSIS — I700 Atherosclerosis of aorta: Secondary | ICD-10-CM | POA: Diagnosis not present

## 2023-07-05 DIAGNOSIS — F5104 Psychophysiologic insomnia: Secondary | ICD-10-CM | POA: Diagnosis not present

## 2023-07-05 DIAGNOSIS — R739 Hyperglycemia, unspecified: Secondary | ICD-10-CM | POA: Diagnosis not present

## 2023-07-05 DIAGNOSIS — Z9181 History of falling: Secondary | ICD-10-CM | POA: Diagnosis not present

## 2023-07-05 DIAGNOSIS — I110 Hypertensive heart disease with heart failure: Secondary | ICD-10-CM | POA: Diagnosis not present

## 2023-07-05 DIAGNOSIS — I2510 Atherosclerotic heart disease of native coronary artery without angina pectoris: Secondary | ICD-10-CM | POA: Diagnosis not present

## 2023-07-05 DIAGNOSIS — E66813 Obesity, class 3: Secondary | ICD-10-CM | POA: Diagnosis not present

## 2023-07-05 DIAGNOSIS — J449 Chronic obstructive pulmonary disease, unspecified: Secondary | ICD-10-CM | POA: Diagnosis not present

## 2023-07-05 DIAGNOSIS — R21 Rash and other nonspecific skin eruption: Secondary | ICD-10-CM | POA: Diagnosis not present

## 2023-07-05 DIAGNOSIS — E785 Hyperlipidemia, unspecified: Secondary | ICD-10-CM | POA: Diagnosis not present

## 2023-07-06 DIAGNOSIS — E875 Hyperkalemia: Secondary | ICD-10-CM | POA: Diagnosis not present

## 2023-08-03 ENCOUNTER — Other Ambulatory Visit: Payer: Self-pay | Admitting: Cardiology

## 2023-08-09 DIAGNOSIS — L400 Psoriasis vulgaris: Secondary | ICD-10-CM | POA: Diagnosis not present

## 2023-08-09 DIAGNOSIS — L209 Atopic dermatitis, unspecified: Secondary | ICD-10-CM | POA: Diagnosis not present

## 2023-08-31 DIAGNOSIS — R918 Other nonspecific abnormal finding of lung field: Secondary | ICD-10-CM | POA: Diagnosis not present

## 2023-08-31 DIAGNOSIS — R059 Cough, unspecified: Secondary | ICD-10-CM | POA: Diagnosis not present

## 2023-08-31 DIAGNOSIS — I509 Heart failure, unspecified: Secondary | ICD-10-CM | POA: Diagnosis not present

## 2023-08-31 DIAGNOSIS — G4733 Obstructive sleep apnea (adult) (pediatric): Secondary | ICD-10-CM | POA: Diagnosis not present

## 2023-08-31 DIAGNOSIS — J449 Chronic obstructive pulmonary disease, unspecified: Secondary | ICD-10-CM | POA: Diagnosis not present

## 2023-09-13 ENCOUNTER — Telehealth: Payer: Self-pay | Admitting: Cardiology

## 2023-09-13 ENCOUNTER — Other Ambulatory Visit: Payer: Self-pay | Admitting: Cardiology

## 2023-09-13 DIAGNOSIS — N289 Disorder of kidney and ureter, unspecified: Secondary | ICD-10-CM

## 2023-09-13 NOTE — Telephone Encounter (Signed)
 Pt c/o medication issue:  1. Name of Medication:    furosemide  (LASIX ) 40 MG tablet    2. How are you currently taking this medication (dosage and times per day)? 3x daily   3. Are you having a reaction (difficulty breathing--STAT)? No   4. What is your medication issue?  Pt called in stating Dr. Gordan Latina told him to take this medication 3x daily as a bp regulator, however the rx has not been changed. Please advise.

## 2023-09-14 MED ORDER — FUROSEMIDE 40 MG PO TABS: 40.0000 mg | ORAL_TABLET | Freq: Three times a day (TID) | ORAL | 3 refills | Status: DC

## 2023-09-14 MED ORDER — FUROSEMIDE 40 MG PO TABS: 40.0000 mg | ORAL_TABLET | Freq: Every day | ORAL | 2 refills | Status: DC

## 2023-09-14 NOTE — Telephone Encounter (Signed)
 Please send to [facility] - [place], [place] - [address] .

## 2023-09-17 DIAGNOSIS — N289 Disorder of kidney and ureter, unspecified: Secondary | ICD-10-CM | POA: Diagnosis not present

## 2023-09-17 NOTE — Addendum Note (Signed)
 Addended by: Einar Grave on: 09/17/2023 09:01 AM   Modules accepted: Orders

## 2023-09-18 ENCOUNTER — Telehealth: Payer: Self-pay

## 2023-09-18 ENCOUNTER — Ambulatory Visit: Payer: Self-pay | Admitting: Cardiology

## 2023-09-18 DIAGNOSIS — J810 Acute pulmonary edema: Secondary | ICD-10-CM | POA: Diagnosis not present

## 2023-09-18 DIAGNOSIS — R0609 Other forms of dyspnea: Secondary | ICD-10-CM | POA: Diagnosis not present

## 2023-09-18 DIAGNOSIS — I110 Hypertensive heart disease with heart failure: Secondary | ICD-10-CM | POA: Diagnosis not present

## 2023-09-18 DIAGNOSIS — Z7982 Long term (current) use of aspirin: Secondary | ICD-10-CM | POA: Diagnosis not present

## 2023-09-18 DIAGNOSIS — I272 Pulmonary hypertension, unspecified: Secondary | ICD-10-CM | POA: Diagnosis not present

## 2023-09-18 DIAGNOSIS — R531 Weakness: Secondary | ICD-10-CM | POA: Diagnosis not present

## 2023-09-18 DIAGNOSIS — I2510 Atherosclerotic heart disease of native coronary artery without angina pectoris: Secondary | ICD-10-CM | POA: Diagnosis not present

## 2023-09-18 DIAGNOSIS — Z955 Presence of coronary angioplasty implant and graft: Secondary | ICD-10-CM | POA: Diagnosis not present

## 2023-09-18 DIAGNOSIS — Z951 Presence of aortocoronary bypass graft: Secondary | ICD-10-CM | POA: Diagnosis not present

## 2023-09-18 DIAGNOSIS — I10 Essential (primary) hypertension: Secondary | ICD-10-CM | POA: Diagnosis not present

## 2023-09-18 DIAGNOSIS — I340 Nonrheumatic mitral (valve) insufficiency: Secondary | ICD-10-CM | POA: Diagnosis not present

## 2023-09-18 DIAGNOSIS — J449 Chronic obstructive pulmonary disease, unspecified: Secondary | ICD-10-CM | POA: Diagnosis not present

## 2023-09-18 DIAGNOSIS — I252 Old myocardial infarction: Secondary | ICD-10-CM | POA: Diagnosis not present

## 2023-09-18 DIAGNOSIS — Z87891 Personal history of nicotine dependence: Secondary | ICD-10-CM | POA: Diagnosis not present

## 2023-09-18 DIAGNOSIS — I5023 Acute on chronic systolic (congestive) heart failure: Secondary | ICD-10-CM | POA: Diagnosis not present

## 2023-09-18 DIAGNOSIS — Z79899 Other long term (current) drug therapy: Secondary | ICD-10-CM | POA: Diagnosis not present

## 2023-09-18 DIAGNOSIS — G4733 Obstructive sleep apnea (adult) (pediatric): Secondary | ICD-10-CM | POA: Diagnosis not present

## 2023-09-18 DIAGNOSIS — J9601 Acute respiratory failure with hypoxia: Secondary | ICD-10-CM | POA: Diagnosis not present

## 2023-09-18 DIAGNOSIS — E66812 Obesity, class 2: Secondary | ICD-10-CM | POA: Diagnosis not present

## 2023-09-18 DIAGNOSIS — I361 Nonrheumatic tricuspid (valve) insufficiency: Secondary | ICD-10-CM | POA: Diagnosis not present

## 2023-09-18 DIAGNOSIS — I509 Heart failure, unspecified: Secondary | ICD-10-CM | POA: Diagnosis not present

## 2023-09-18 DIAGNOSIS — N400 Enlarged prostate without lower urinary tract symptoms: Secondary | ICD-10-CM | POA: Diagnosis not present

## 2023-09-18 DIAGNOSIS — E785 Hyperlipidemia, unspecified: Secondary | ICD-10-CM | POA: Diagnosis not present

## 2023-09-18 DIAGNOSIS — R9431 Abnormal electrocardiogram [ECG] [EKG]: Secondary | ICD-10-CM | POA: Diagnosis not present

## 2023-09-18 LAB — COMPREHENSIVE METABOLIC PANEL WITH GFR
ALT: 38 IU/L (ref 0–44)
AST: 27 IU/L (ref 0–40)
Albumin: 3.9 g/dL (ref 3.8–4.8)
Alkaline Phosphatase: 94 IU/L (ref 44–121)
BUN/Creatinine Ratio: 15 (ref 10–24)
BUN: 12 mg/dL (ref 8–27)
Bilirubin Total: 1.1 mg/dL (ref 0.0–1.2)
CO2: 20 mmol/L (ref 20–29)
Calcium: 10.2 mg/dL (ref 8.6–10.2)
Chloride: 107 mmol/L — ABNORMAL HIGH (ref 96–106)
Creatinine, Ser: 0.79 mg/dL (ref 0.76–1.27)
Globulin, Total: 2.2 g/dL (ref 1.5–4.5)
Glucose: 84 mg/dL (ref 70–99)
Potassium: 5.8 mmol/L (ref 3.5–5.2)
Sodium: 142 mmol/L (ref 134–144)
Total Protein: 6.1 g/dL (ref 6.0–8.5)
eGFR: 94 mL/min/{1.73_m2} (ref >59)

## 2023-09-18 NOTE — Telephone Encounter (Signed)
 Spoke with pt. Advised per Dr. Gordan Latina to stop Lisinopril and to go to ED for increased Potassium of 5.8. Pt verbalized understanding and had no further questions. Routed to PCP.

## 2023-09-19 ENCOUNTER — Encounter: Payer: Self-pay | Admitting: Cardiology

## 2023-09-24 ENCOUNTER — Telehealth: Payer: Self-pay

## 2023-09-24 ENCOUNTER — Other Ambulatory Visit: Payer: Self-pay

## 2023-09-24 DIAGNOSIS — I110 Hypertensive heart disease with heart failure: Secondary | ICD-10-CM | POA: Diagnosis not present

## 2023-09-24 DIAGNOSIS — I5023 Acute on chronic systolic (congestive) heart failure: Secondary | ICD-10-CM | POA: Diagnosis not present

## 2023-09-24 DIAGNOSIS — J449 Chronic obstructive pulmonary disease, unspecified: Secondary | ICD-10-CM | POA: Diagnosis not present

## 2023-09-24 DIAGNOSIS — J9601 Acute respiratory failure with hypoxia: Secondary | ICD-10-CM | POA: Diagnosis not present

## 2023-09-24 DIAGNOSIS — I2510 Atherosclerotic heart disease of native coronary artery without angina pectoris: Secondary | ICD-10-CM | POA: Diagnosis not present

## 2023-09-24 NOTE — Telephone Encounter (Signed)
 Pt calling back to speak with nurse Olam in regards to previous call this morning. Please advise

## 2023-09-24 NOTE — Telephone Encounter (Signed)
 Spoke with pt. He stated that all of his vitals were good and he was not having issues at this time. He stated that he would be seeing his PCP this morning for a follow up. Encouraged to call if he has any problems or changes in his condition.

## 2023-09-24 NOTE — Telephone Encounter (Signed)
 Left message for the patient to call back.

## 2023-09-24 NOTE — Telephone Encounter (Signed)
 Pt is returning call to a nurse

## 2023-10-01 DIAGNOSIS — J449 Chronic obstructive pulmonary disease, unspecified: Secondary | ICD-10-CM | POA: Diagnosis not present

## 2023-10-01 DIAGNOSIS — G4733 Obstructive sleep apnea (adult) (pediatric): Secondary | ICD-10-CM | POA: Diagnosis not present

## 2023-10-01 DIAGNOSIS — R918 Other nonspecific abnormal finding of lung field: Secondary | ICD-10-CM | POA: Diagnosis not present

## 2023-10-01 DIAGNOSIS — J301 Allergic rhinitis due to pollen: Secondary | ICD-10-CM | POA: Diagnosis not present

## 2023-10-02 DIAGNOSIS — E875 Hyperkalemia: Secondary | ICD-10-CM | POA: Diagnosis not present

## 2023-10-04 ENCOUNTER — Other Ambulatory Visit: Payer: Self-pay | Admitting: Cardiology

## 2023-10-08 ENCOUNTER — Encounter: Payer: Self-pay | Admitting: Internal Medicine

## 2023-10-18 DIAGNOSIS — F5104 Psychophysiologic insomnia: Secondary | ICD-10-CM | POA: Diagnosis not present

## 2023-10-18 DIAGNOSIS — J449 Chronic obstructive pulmonary disease, unspecified: Secondary | ICD-10-CM | POA: Diagnosis not present

## 2023-10-18 DIAGNOSIS — E875 Hyperkalemia: Secondary | ICD-10-CM | POA: Diagnosis not present

## 2023-10-18 DIAGNOSIS — I110 Hypertensive heart disease with heart failure: Secondary | ICD-10-CM | POA: Diagnosis not present

## 2023-10-18 DIAGNOSIS — I714 Abdominal aortic aneurysm, without rupture, unspecified: Secondary | ICD-10-CM | POA: Diagnosis not present

## 2023-10-18 DIAGNOSIS — I2510 Atherosclerotic heart disease of native coronary artery without angina pectoris: Secondary | ICD-10-CM | POA: Diagnosis not present

## 2023-10-18 DIAGNOSIS — R739 Hyperglycemia, unspecified: Secondary | ICD-10-CM | POA: Diagnosis not present

## 2023-10-18 DIAGNOSIS — E785 Hyperlipidemia, unspecified: Secondary | ICD-10-CM | POA: Diagnosis not present

## 2023-11-06 DIAGNOSIS — U071 COVID-19: Secondary | ICD-10-CM | POA: Diagnosis not present

## 2023-11-06 DIAGNOSIS — R509 Fever, unspecified: Secondary | ICD-10-CM | POA: Diagnosis not present

## 2023-11-19 ENCOUNTER — Telehealth: Payer: Self-pay | Admitting: Cardiology

## 2023-11-19 NOTE — Telephone Encounter (Signed)
 Spoke with pt. He reported swelling in his legs. Weight is down 6 lbs, BP WNL, O2 - 90's, Lasix  120mg  q AM. Dr. Liborio rec watching salt intake, monitor weights and come for a follow up appt. Sent to front desk for sooner appt. Pt verbalized understanding and had no further questions.

## 2023-11-19 NOTE — Telephone Encounter (Signed)
 Pt c/o swelling/edema: STAT if pt has developed SOB within 24 hours  If swelling, where is the swelling located? Legs/feet, Skin is really tight and painfull  How much weight have you gained and in what time span? Has not weighed  Have you gained 2 pounds in a day or 5 pounds in a week? Not sure  Do you have a log of your daily weights (if so, list)? no  Are you currently taking a fluid pill? yes  Are you currently SOB? yes  Have you traveled recently in a car or plane for an extended period of time?

## 2023-11-23 ENCOUNTER — Encounter: Payer: Self-pay | Admitting: Cardiology

## 2023-11-23 ENCOUNTER — Ambulatory Visit: Attending: Cardiology | Admitting: Cardiology

## 2023-11-23 VITALS — BP 110/70 | HR 69 | Ht 67.5 in | Wt 254.0 lb

## 2023-11-23 DIAGNOSIS — R0609 Other forms of dyspnea: Secondary | ICD-10-CM | POA: Diagnosis not present

## 2023-11-23 DIAGNOSIS — I2541 Coronary artery aneurysm: Secondary | ICD-10-CM | POA: Insufficient documentation

## 2023-11-23 DIAGNOSIS — E119 Type 2 diabetes mellitus without complications: Secondary | ICD-10-CM | POA: Insufficient documentation

## 2023-11-23 DIAGNOSIS — I10 Essential (primary) hypertension: Secondary | ICD-10-CM | POA: Diagnosis not present

## 2023-11-23 DIAGNOSIS — E785 Hyperlipidemia, unspecified: Secondary | ICD-10-CM | POA: Insufficient documentation

## 2023-11-23 DIAGNOSIS — I2510 Atherosclerotic heart disease of native coronary artery without angina pectoris: Secondary | ICD-10-CM | POA: Insufficient documentation

## 2023-11-23 DIAGNOSIS — I110 Hypertensive heart disease with heart failure: Secondary | ICD-10-CM | POA: Diagnosis not present

## 2023-11-23 DIAGNOSIS — G4733 Obstructive sleep apnea (adult) (pediatric): Secondary | ICD-10-CM | POA: Insufficient documentation

## 2023-11-23 NOTE — Progress Notes (Signed)
 Cardiology Office Note:    Date:  11/23/2023   ID:  Phillip Dunn, DOB [birth year], MRN [id number]  PCP:  Phillip Vicenta BRAVO, MD  Cardiologist:  Phillip Fitch, MD    Referring MD: Phillip Vicenta BRAVO, MD   Chief Complaint  Patient presents with   Follow-up    History of Present Illness:    Phillip Dunn is a 73 y.o. 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  Recently in the being in the hospital because of fluid overload however spent only 24 hours and did not sign AMA, I did see him at that time.  Now he comes today to my office however he ended up having COVID still recovering from it.  He has been coughing High fever finally everything subsided he is doing better but still rash.  No chest pain tightness squeezing pressure burning chest but does have significant swelling of lower extremities  Past Medical History:  Diagnosis Date   Abnormal endocrine laboratory test finding 08/02/2021   Adrenal disorder (HCC)    Asthma    Benign hypertensive heart disease with CHF (congestive heart failure) (HCC)    Bradycardia    Class 2 severe obesity with serious comorbidity and body mass index (BMI) of 39.0 to 39.9 in adult [facility]) 08/08/2021   Coronary artery aneurysm 06/19/2017   Measuring 14 mm on CT from February 2019   Coronary artery disease    Coronary artery disease involving native coronary artery of native heart without angina pectoris 12/08/2014   Overview:  Multiple stents to circumflex artery in March 18, 2014  Formatting of this note might be different from the original. 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   aspirin EC 81 MG tablet Take 81 mg by mouth daily.   atorvastatin (LIPITOR) 80 MG tablet Take 80 mg by mouth daily.   clobetasol cream (TEMOVATE) 0.05 % Apply 1 Application topically daily as needed (exzema).   clopidogrel  (PLAVIX ) 75 MG tablet TAKE 1 TABLET EVERY DAY   Coenzyme Q10 (COQ-10) 10 MG CAPS Take 10 mg by mouth daily.   finasteride  (PROSCAR ) 5 MG tablet Take 1 tablet (5 mg total) by mouth daily.   fluticasone (FLONASE) 50 MCG/ACT nasal spray Place 2 sprays into both nostrils daily.   furosemide  (LASIX ) 40 MG tablet Take 1 tablet (40 mg total) by mouth 3 (three) times daily.   IPRATROPIUM BROMIDE IN Place 1 spray into both nostrils 2 (two) times daily.  KRILL OIL PO Take 1,000 mg by mouth daily.   montelukast (SINGULAIR) 10 MG tablet Take 10 mg by mouth daily.   OVER THE COUNTER MEDICATION Take 1 tablet by mouth daily. Tylenol ES   SYMBICORT 160-4.5 MCG/ACT inhaler Inhale 2 puffs into the lungs 2 (two) times daily.   tamsulosin (FLOMAX) 0.4 MG CAPS capsule Take 0.4 mg by mouth daily.   zolpidem (AMBIEN) 10 MG tablet Take 10 mg by mouth at bedtime as needed for sleep.     Allergies:   Grass pollen(k-o-r-t-swt vern), Other, and Pollen extract   Social History   Socioeconomic History   Marital status: Married    Spouse name: Not on  file   Number of children: Not on file   Years of education: Not on file   Highest education level: Not on file  Occupational History   Not on file  Tobacco Use   Smoking status: Former    Types: Cigarettes   Smokeless tobacco: Never  Vaping Use   Vaping status: Former  Substance and Sexual Activity   Alcohol use: Yes    Alcohol/week: 2.0 - 3.0 standard drinks of alcohol    Types: 2 - 3 Glasses of wine per week    Comment: daily   Drug use: No   Sexual activity: Not on file  Other Topics Concern   Not on file  Social History Narrative   Not on file   Social Drivers of Health   Financial Resource Strain: Not on file  Food Insecurity: Not on file  Transportation Needs: Not on file  Physical Activity: Not on file  Stress: Not on file  Social Connections: Not on file     Family History: The patient's family history includes Stroke in his mother. ROS:   Please see the history of present illness.    All 14 point review of systems negative except as described per history of present illness  EKGs/Labs/Other Studies Reviewed:    EKG Interpretation Date/Time:  Friday November 23 2023 08:43:45 EDT Ventricular Rate:  69 PR Interval:  178 QRS Duration:  104 QT Interval:  412 QTC Calculation: 441 R Axis:   74  Text Interpretation: Sinus rhythm with occasional Premature ventricular complexes Nonspecific ST abnormality Abnormal ECG When compared with ECG of 20-Nov-2022 14:35, Premature ventricular complexes are now Present Criteria for Inferior infarct are no longer Present Nonspecific T wave abnormality has replaced inverted T waves in Inferior leads Confirmed by Phillip Dunn [phone]) on 11/23/2023 8:59:12 AM    Recent Labs: 09/17/2023: ALT 38; BUN 12; Creatinine, Ser 0.79; Potassium 5.8; Sodium 142  Recent Lipid Panel    Component Value Date/Time   CHOL 114 02/01/2022 1546   TRIG 87 02/01/2022 1546   HDL 43 02/01/2022 1546   CHOLHDL 2.7 02/01/2022 1546   LDLCALC 54  02/01/2022 1546    Physical Exam:    VS:  BP 110/70   Pulse 69   Ht 5' 7.5 (1.715 m)   Wt 254 lb (115.2 kg)   SpO2 93%   BMI 39.19 kg/m     Wt Readings from Last 3 Encounters:  11/23/23 254 lb (115.2 kg)  05/25/23 259 lb 12.8 oz (117.8 kg)  11/20/22 248 lb 9.6 oz (112.8 kg)     GEN:  Well nourished, well developed in no acute distress HEENT: Normal NECK: No JVD; No carotid bruits LYMPHATICS: No lymphadenopathy CARDIAC: RRR, no murmurs, no rubs, no gallops RESPIRATORY:  Clear to auscultation without rales, wheezing  or rhonchi  ABDOMEN: Soft, non-tender, non-distended MUSCULOSKELETAL: 2+ edema; No deformity  SKIN: Warm and dry LOWER EXTREMITIES: no swelling NEUROLOGIC:  Alert and oriented x 3 PSYCHIATRIC:  Normal affect   ASSESSMENT:    1. Essential hypertension   2. Dyspnea on exertion   3. Coronary artery disease involving native coronary artery of native heart without angina pectoris   4. Coronary artery aneurysm   5. Benign hypertensive heart disease with CHF (congestive heart failure) (HCC)   6. OSA (obstructive sleep apnea)   7. Diabetes mellitus without complication (HCC)   8. Dyslipidemia    PLAN:    In order of problems listed above:  Essential hypertension blood pressure well-controlled continue present management. Coronary disease no recent evidence of reactivation problem. Cardiomyopathy ejection fraction 45%.  He was on lisinopril has been withdrawn.  Will check Chem-7 proBNP and based on that decide if he can go back on ARB or maybe even Entresto. Congestive heart failure.  He looks decompensated I think the reason for that is the fact that he recently have COVID.  Will check proBNP to see if there is any room for improvement. Diabetes followed by antimedicine team.  Overall he looks pretty rough.  Will see him back shortly   Medication Adjustments/Labs and Tests Ordered: Current medicines are reviewed at length with the patient today.  Concerns  regarding medicines are outlined above.  Orders Placed This Encounter  Procedures   Basic metabolic panel with GFR   Pro b natriuretic peptide (BNP)   EKG 12-Lead   Medication changes: No orders of the defined types were placed in this encounter.   Signed, Phillip DOROTHA Fitch, MD, [facility] 11/23/2023 9:17 AM    [facility]

## 2023-11-23 NOTE — Patient Instructions (Signed)
Medication Instructions:  Your physician recommends that you continue on your current medications as directed. Please refer to the Current Medication list given to you today.  *If you need a refill on your cardiac medications before your next appointment, please call your pharmacy*   Lab Work: BMP, ProBNP- today If you have labs (blood work) drawn today and your tests are completely normal, you will receive your results only by: MyChart Message (if you have MyChart) OR A paper copy in the mail If you have any lab test that is abnormal or we need to change your treatment, we will call you to review the results.   Testing/Procedures: None Ordered   Follow-Up: At [facility], you and your health needs are our priority.  As part of our continuing mission to provide you with exceptional heart care, we have created designated Provider Care Teams.  These Care Teams include your primary Cardiologist (physician) and Advanced Practice Providers (APPs -  Physician Assistants and Nurse Practitioners) who all work together to provide you with the care you need, when you need it.  We recommend signing up for the patient portal called "MyChart".  Sign up information is provided on this After Visit Summary.  MyChart is used to connect with patients for Virtual Visits (Telemedicine).  Patients are able to view lab/test results, encounter notes, upcoming appointments, etc.  Non-urgent messages can be sent to your provider as well.   To learn more about what you can do with MyChart, go to [email or web address].    Your next appointment:   1 month(s)  The format for your next appointment:   In Person  Provider:   Robert Krasowski, MD    Other Instructions NA  

## 2023-11-24 LAB — BASIC METABOLIC PANEL WITH GFR
BUN/Creatinine Ratio: 15 (ref 10–24)
BUN: 13 mg/dL (ref 8–27)
CO2: 22 mmol/L (ref 20–29)
Calcium: 9.5 mg/dL (ref 8.6–10.2)
Chloride: 105 mmol/L (ref 96–106)
Creatinine, Ser: 0.86 mg/dL (ref 0.76–1.27)
Glucose: 90 mg/dL (ref 70–99)
Potassium: 4.3 mmol/L (ref 3.5–5.2)
Sodium: 144 mmol/L (ref 134–144)
eGFR: 91 mL/min/1.73 (ref >59)

## 2023-11-24 LAB — PRO B NATRIURETIC PEPTIDE: NT-Pro BNP: 624 pg/mL — ABNORMAL HIGH (ref 0–376)

## 2023-11-27 ENCOUNTER — Ambulatory Visit: Admitting: Cardiology

## 2023-11-28 ENCOUNTER — Ambulatory Visit: Payer: Self-pay | Admitting: Cardiology

## 2023-11-28 DIAGNOSIS — I10 Essential (primary) hypertension: Secondary | ICD-10-CM

## 2023-12-05 DIAGNOSIS — G4733 Obstructive sleep apnea (adult) (pediatric): Secondary | ICD-10-CM | POA: Diagnosis not present

## 2023-12-05 DIAGNOSIS — J301 Allergic rhinitis due to pollen: Secondary | ICD-10-CM | POA: Diagnosis not present

## 2023-12-05 DIAGNOSIS — R918 Other nonspecific abnormal finding of lung field: Secondary | ICD-10-CM | POA: Diagnosis not present

## 2023-12-05 DIAGNOSIS — J449 Chronic obstructive pulmonary disease, unspecified: Secondary | ICD-10-CM | POA: Diagnosis not present

## 2023-12-06 DIAGNOSIS — I10 Essential (primary) hypertension: Secondary | ICD-10-CM | POA: Diagnosis not present

## 2023-12-06 DIAGNOSIS — H35379 Puckering of macula, unspecified eye: Secondary | ICD-10-CM | POA: Diagnosis not present

## 2023-12-06 DIAGNOSIS — H18413 Arcus senilis, bilateral: Secondary | ICD-10-CM | POA: Diagnosis not present

## 2023-12-06 DIAGNOSIS — H1131 Conjunctival hemorrhage, right eye: Secondary | ICD-10-CM | POA: Diagnosis not present

## 2023-12-16 DIAGNOSIS — S50812A Abrasion of left forearm, initial encounter: Secondary | ICD-10-CM | POA: Diagnosis not present

## 2023-12-16 DIAGNOSIS — L03114 Cellulitis of left upper limb: Secondary | ICD-10-CM | POA: Diagnosis not present

## 2023-12-20 DIAGNOSIS — L03114 Cellulitis of left upper limb: Secondary | ICD-10-CM | POA: Diagnosis not present

## 2023-12-21 MED ORDER — LOSARTAN POTASSIUM 25 MG PO TABS: 25.0000 mg | ORAL_TABLET | Freq: Every day | ORAL | 3 refills | Status: DC

## 2023-12-24 ENCOUNTER — Telehealth: Payer: Self-pay

## 2023-12-24 NOTE — Telephone Encounter (Signed)
 Pt viewed lab results on My Chart per Dr. Karry note. Routed to PCP.

## 2023-12-25 ENCOUNTER — Ambulatory Visit: Admitting: Cardiology

## 2023-12-28 DIAGNOSIS — J301 Allergic rhinitis due to pollen: Secondary | ICD-10-CM | POA: Diagnosis not present

## 2023-12-28 DIAGNOSIS — I2510 Atherosclerotic heart disease of native coronary artery without angina pectoris: Secondary | ICD-10-CM | POA: Diagnosis not present

## 2023-12-28 DIAGNOSIS — D649 Anemia, unspecified: Secondary | ICD-10-CM | POA: Diagnosis not present

## 2023-12-28 DIAGNOSIS — I517 Cardiomegaly: Secondary | ICD-10-CM | POA: Diagnosis not present

## 2023-12-28 DIAGNOSIS — I5022 Chronic systolic (congestive) heart failure: Secondary | ICD-10-CM | POA: Diagnosis not present

## 2023-12-28 DIAGNOSIS — Z7902 Long term (current) use of antithrombotics/antiplatelets: Secondary | ICD-10-CM | POA: Diagnosis not present

## 2023-12-28 DIAGNOSIS — K921 Melena: Secondary | ICD-10-CM | POA: Diagnosis not present

## 2023-12-28 DIAGNOSIS — J849 Interstitial pulmonary disease, unspecified: Secondary | ICD-10-CM | POA: Diagnosis not present

## 2023-12-28 DIAGNOSIS — K2270 Barrett's esophagus without dysplasia: Secondary | ICD-10-CM | POA: Diagnosis not present

## 2023-12-28 DIAGNOSIS — J9601 Acute respiratory failure with hypoxia: Secondary | ICD-10-CM | POA: Diagnosis not present

## 2023-12-28 DIAGNOSIS — I361 Nonrheumatic tricuspid (valve) insufficiency: Secondary | ICD-10-CM | POA: Diagnosis not present

## 2023-12-28 DIAGNOSIS — I340 Nonrheumatic mitral (valve) insufficiency: Secondary | ICD-10-CM | POA: Diagnosis not present

## 2023-12-28 DIAGNOSIS — Z7901 Long term (current) use of anticoagulants: Secondary | ICD-10-CM | POA: Diagnosis not present

## 2023-12-28 DIAGNOSIS — J449 Chronic obstructive pulmonary disease, unspecified: Secondary | ICD-10-CM | POA: Diagnosis not present

## 2023-12-28 DIAGNOSIS — Z8616 Personal history of COVID-19: Secondary | ICD-10-CM | POA: Diagnosis not present

## 2023-12-28 DIAGNOSIS — R000 Tachycardia, unspecified: Secondary | ICD-10-CM | POA: Diagnosis not present

## 2023-12-28 DIAGNOSIS — I519 Heart disease, unspecified: Secondary | ICD-10-CM | POA: Diagnosis not present

## 2023-12-28 DIAGNOSIS — Z860109 Personal history of other colon polyps: Secondary | ICD-10-CM | POA: Diagnosis not present

## 2023-12-28 DIAGNOSIS — N318 Other neuromuscular dysfunction of bladder: Secondary | ICD-10-CM | POA: Diagnosis not present

## 2023-12-28 DIAGNOSIS — I491 Atrial premature depolarization: Secondary | ICD-10-CM | POA: Diagnosis not present

## 2023-12-28 DIAGNOSIS — Z951 Presence of aortocoronary bypass graft: Secondary | ICD-10-CM | POA: Diagnosis not present

## 2023-12-28 DIAGNOSIS — I252 Old myocardial infarction: Secondary | ICD-10-CM | POA: Diagnosis not present

## 2023-12-28 DIAGNOSIS — K264 Chronic or unspecified duodenal ulcer with hemorrhage: Secondary | ICD-10-CM | POA: Diagnosis not present

## 2023-12-28 DIAGNOSIS — J441 Chronic obstructive pulmonary disease with (acute) exacerbation: Secondary | ICD-10-CM | POA: Diagnosis not present

## 2023-12-28 DIAGNOSIS — K922 Gastrointestinal hemorrhage, unspecified: Secondary | ICD-10-CM | POA: Diagnosis not present

## 2023-12-28 DIAGNOSIS — Z6841 Body Mass Index (BMI) 40.0 and over, adult: Secondary | ICD-10-CM | POA: Diagnosis not present

## 2023-12-28 DIAGNOSIS — I5189 Other ill-defined heart diseases: Secondary | ICD-10-CM | POA: Diagnosis not present

## 2023-12-28 DIAGNOSIS — D62 Acute posthemorrhagic anemia: Secondary | ICD-10-CM | POA: Diagnosis not present

## 2023-12-28 DIAGNOSIS — Z87891 Personal history of nicotine dependence: Secondary | ICD-10-CM | POA: Diagnosis not present

## 2023-12-28 DIAGNOSIS — R0602 Shortness of breath: Secondary | ICD-10-CM | POA: Diagnosis not present

## 2023-12-28 DIAGNOSIS — G4733 Obstructive sleep apnea (adult) (pediatric): Secondary | ICD-10-CM | POA: Diagnosis not present

## 2023-12-28 DIAGNOSIS — I7143 Infrarenal abdominal aortic aneurysm, without rupture: Secondary | ICD-10-CM | POA: Diagnosis not present

## 2023-12-28 DIAGNOSIS — Z7951 Long term (current) use of inhaled steroids: Secondary | ICD-10-CM | POA: Diagnosis not present

## 2023-12-28 DIAGNOSIS — Z7982 Long term (current) use of aspirin: Secondary | ICD-10-CM | POA: Diagnosis not present

## 2023-12-28 DIAGNOSIS — I350 Nonrheumatic aortic (valve) stenosis: Secondary | ICD-10-CM | POA: Diagnosis not present

## 2023-12-28 DIAGNOSIS — I110 Hypertensive heart disease with heart failure: Secondary | ICD-10-CM | POA: Diagnosis not present

## 2023-12-28 DIAGNOSIS — E785 Hyperlipidemia, unspecified: Secondary | ICD-10-CM | POA: Diagnosis not present

## 2023-12-28 DIAGNOSIS — J4540 Moderate persistent asthma, uncomplicated: Secondary | ICD-10-CM | POA: Diagnosis not present

## 2023-12-28 DIAGNOSIS — E669 Obesity, unspecified: Secondary | ICD-10-CM | POA: Diagnosis not present

## 2023-12-28 DIAGNOSIS — R0600 Dyspnea, unspecified: Secondary | ICD-10-CM | POA: Diagnosis not present

## 2023-12-28 DIAGNOSIS — R531 Weakness: Secondary | ICD-10-CM | POA: Diagnosis not present

## 2023-12-28 DIAGNOSIS — I878 Other specified disorders of veins: Secondary | ICD-10-CM | POA: Diagnosis not present

## 2023-12-31 ENCOUNTER — Telehealth: Payer: Self-pay | Admitting: Cardiology

## 2023-12-31 NOTE — Telephone Encounter (Signed)
 Pt calling to make office aware he is admitted at [facility] in room 464. Pt had emergency surgery for ulcer. He has been admitted after visiting ED. He is requesting a callback. Please advise.

## 2024-01-01 DIAGNOSIS — J301 Allergic rhinitis due to pollen: Secondary | ICD-10-CM | POA: Diagnosis not present

## 2024-01-01 DIAGNOSIS — G4733 Obstructive sleep apnea (adult) (pediatric): Secondary | ICD-10-CM | POA: Diagnosis not present

## 2024-01-01 DIAGNOSIS — J449 Chronic obstructive pulmonary disease, unspecified: Secondary | ICD-10-CM | POA: Diagnosis not present

## 2024-01-01 DIAGNOSIS — J4540 Moderate persistent asthma, uncomplicated: Secondary | ICD-10-CM | POA: Diagnosis not present

## 2024-01-08 DIAGNOSIS — J441 Chronic obstructive pulmonary disease with (acute) exacerbation: Secondary | ICD-10-CM | POA: Diagnosis not present

## 2024-01-08 DIAGNOSIS — D62 Acute posthemorrhagic anemia: Secondary | ICD-10-CM | POA: Diagnosis not present

## 2024-01-08 DIAGNOSIS — K921 Melena: Secondary | ICD-10-CM | POA: Diagnosis not present

## 2024-01-08 DIAGNOSIS — I110 Hypertensive heart disease with heart failure: Secondary | ICD-10-CM | POA: Diagnosis not present

## 2024-01-08 DIAGNOSIS — I5022 Chronic systolic (congestive) heart failure: Secondary | ICD-10-CM | POA: Diagnosis not present

## 2024-01-08 DIAGNOSIS — R338 Other retention of urine: Secondary | ICD-10-CM | POA: Diagnosis not present

## 2024-01-08 DIAGNOSIS — Z87891 Personal history of nicotine dependence: Secondary | ICD-10-CM | POA: Diagnosis not present

## 2024-01-08 DIAGNOSIS — G4733 Obstructive sleep apnea (adult) (pediatric): Secondary | ICD-10-CM | POA: Diagnosis not present

## 2024-01-08 DIAGNOSIS — I2510 Atherosclerotic heart disease of native coronary artery without angina pectoris: Secondary | ICD-10-CM | POA: Diagnosis not present

## 2024-01-08 DIAGNOSIS — N401 Enlarged prostate with lower urinary tract symptoms: Secondary | ICD-10-CM | POA: Diagnosis not present

## 2024-01-08 DIAGNOSIS — Z7951 Long term (current) use of inhaled steroids: Secondary | ICD-10-CM | POA: Diagnosis not present

## 2024-01-08 DIAGNOSIS — Z951 Presence of aortocoronary bypass graft: Secondary | ICD-10-CM | POA: Diagnosis not present

## 2024-01-08 DIAGNOSIS — Z955 Presence of coronary angioplasty implant and graft: Secondary | ICD-10-CM | POA: Diagnosis not present

## 2024-01-08 DIAGNOSIS — I252 Old myocardial infarction: Secondary | ICD-10-CM | POA: Diagnosis not present

## 2024-01-08 DIAGNOSIS — E785 Hyperlipidemia, unspecified: Secondary | ICD-10-CM | POA: Diagnosis not present

## 2024-01-08 DIAGNOSIS — I7143 Infrarenal abdominal aortic aneurysm, without rupture: Secondary | ICD-10-CM | POA: Diagnosis not present

## 2024-01-08 DIAGNOSIS — Z7902 Long term (current) use of antithrombotics/antiplatelets: Secondary | ICD-10-CM | POA: Diagnosis not present

## 2024-01-08 DIAGNOSIS — Z7982 Long term (current) use of aspirin: Secondary | ICD-10-CM | POA: Diagnosis not present

## 2024-01-08 DIAGNOSIS — J9601 Acute respiratory failure with hypoxia: Secondary | ICD-10-CM | POA: Diagnosis not present

## 2024-01-11 DIAGNOSIS — K2270 Barrett's esophagus without dysplasia: Secondary | ICD-10-CM | POA: Diagnosis not present

## 2024-01-11 DIAGNOSIS — D62 Acute posthemorrhagic anemia: Secondary | ICD-10-CM | POA: Diagnosis not present

## 2024-01-11 DIAGNOSIS — J9601 Acute respiratory failure with hypoxia: Secondary | ICD-10-CM | POA: Diagnosis not present

## 2024-01-11 DIAGNOSIS — I714 Abdominal aortic aneurysm, without rupture, unspecified: Secondary | ICD-10-CM | POA: Diagnosis not present

## 2024-01-11 DIAGNOSIS — K264 Chronic or unspecified duodenal ulcer with hemorrhage: Secondary | ICD-10-CM | POA: Diagnosis not present

## 2024-01-11 DIAGNOSIS — I5021 Acute systolic (congestive) heart failure: Secondary | ICD-10-CM | POA: Diagnosis not present

## 2024-01-14 DIAGNOSIS — J9601 Acute respiratory failure with hypoxia: Secondary | ICD-10-CM | POA: Diagnosis not present

## 2024-01-14 DIAGNOSIS — J441 Chronic obstructive pulmonary disease with (acute) exacerbation: Secondary | ICD-10-CM | POA: Diagnosis not present

## 2024-01-14 DIAGNOSIS — I5022 Chronic systolic (congestive) heart failure: Secondary | ICD-10-CM | POA: Diagnosis not present

## 2024-01-14 DIAGNOSIS — D62 Acute posthemorrhagic anemia: Secondary | ICD-10-CM | POA: Diagnosis not present

## 2024-01-14 DIAGNOSIS — K921 Melena: Secondary | ICD-10-CM | POA: Diagnosis not present

## 2024-01-14 DIAGNOSIS — I110 Hypertensive heart disease with heart failure: Secondary | ICD-10-CM | POA: Diagnosis not present

## 2024-01-16 ENCOUNTER — Encounter: Payer: Self-pay | Admitting: Cardiology

## 2024-01-16 ENCOUNTER — Ambulatory Visit: Attending: Cardiology | Admitting: Cardiology

## 2024-01-16 ENCOUNTER — Other Ambulatory Visit: Payer: Self-pay

## 2024-01-16 VITALS — BP 102/60 | HR 80 | Ht 66.0 in | Wt 254.4 lb

## 2024-01-16 DIAGNOSIS — I2541 Coronary artery aneurysm: Secondary | ICD-10-CM | POA: Insufficient documentation

## 2024-01-16 DIAGNOSIS — E782 Mixed hyperlipidemia: Secondary | ICD-10-CM | POA: Insufficient documentation

## 2024-01-16 DIAGNOSIS — I110 Hypertensive heart disease with heart failure: Secondary | ICD-10-CM | POA: Diagnosis not present

## 2024-01-16 DIAGNOSIS — I2510 Atherosclerotic heart disease of native coronary artery without angina pectoris: Secondary | ICD-10-CM | POA: Insufficient documentation

## 2024-01-16 DIAGNOSIS — G4733 Obstructive sleep apnea (adult) (pediatric): Secondary | ICD-10-CM | POA: Diagnosis not present

## 2024-01-16 MED ORDER — FUROSEMIDE 40 MG PO TABS: 40.0000 mg | ORAL_TABLET | Freq: Every day | ORAL | Status: DC

## 2024-01-16 NOTE — Progress Notes (Signed)
 Cardiology Office Note:    Date:  01/16/2024   ID:  Phillip Dunn, DOB [birth year], MRN [id number]  PCP:  Keren Vicenta BRAVO, MD  Cardiologist:  Lamar Fitch, MD    Referring MD: Keren Vicenta BRAVO, MD   No chief complaint on file. Was in the hospital  History of Present Illness:    Phillip Dunn is a 73 y.o. male past medical history significant for coronary artery disease status post coronary bypass graft years ago, mildly reduced left ventricular ejection fraction 45%, dyslipidemia, obstructive sleep apnea, essential hypertension, recently he ended up being in [facility] because of GI bleed apparently got GI procedure done which showed duodenal ulcers, he required 4 units of blood.  Aspirin has been withdrawn, he still on Plavix .  Comes for follow-up still feeling lousy pale looking and hemoglobin just few days ago was 7.1 that being followed by Dr. Boby.  Denies have any chest pain tightness squeezing pressure burning chest but shortness of breath is there.  Past Medical History:  Diagnosis Date   Abnormal endocrine laboratory test finding 08/02/2021   Adrenal disorder    Asthma    Benign hypertensive heart disease with CHF (congestive heart failure) (HCC)    Bradycardia    Carpal tunnel syndrome of right wrist 05/08/2019   Class 2 severe obesity with serious comorbidity and body mass index (BMI) of 39.0 to 39.9 in adult 08/08/2021   Coronary artery aneurysm 06/19/2017   Measuring 14 mm on CT from February 2019   Coronary artery disease    Coronary artery disease involving native coronary artery of native heart without angina pectoris 12/08/2014   Overview:  Multiple stents to circumflex artery in March 18, 2014  Formatting of this note might be different from the original. 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   Myocardial infarct (HCC)    OSA (obstructive sleep apnea)    Secondary hypotension    Sleep apnea 11/28/2016   Strain of thoracic back region 01/17/2023   Strain of thoracic region 01/17/2023   Trigger finger, right middle finger 10/14/2019   Venous insufficiency of left lower extremity     Past Surgical History:  Procedure Laterality Date   CARDIAC CATHETERIZATION     CARPAL TUNNEL RELEASE Left    CATARACT EXTRACTION, BILATERAL     CORONARY ANGIOPLASTY     CORONARY ARTERY BYPASS GRAFT     Right Hip replacement Right 06/2020    Current Medications: Current Meds  Medication Sig   albuterol  (VENTOLIN  HFA) 108 (90 Base) MCG/ACT inhaler Inhale 2 puffs into the lungs every 4 (four) hours as needed for wheezing.   aspirin EC 81 MG tablet Take 81 mg by mouth daily.   atorvastatin (LIPITOR) 80 MG tablet Take 80 mg by mouth daily.   clopidogrel  (PLAVIX ) 75 MG tablet TAKE 1 TABLET EVERY DAY   Coenzyme Q10 (COQ-10) 10 MG CAPS Take 10 mg by mouth daily.   ferrous sulfate 325 (65 FE) MG EC tablet Take 325 mg by mouth 2 (two) times daily.   finasteride  (PROSCAR ) 5 MG tablet Take 1 tablet (5 mg total) by mouth daily.  furosemide  (LASIX ) 40 MG tablet Take 1 tablet (40 mg total) by mouth daily.   IPRATROPIUM BROMIDE IN Place 1 spray into both nostrils 2 (two) times daily.   KRILL OIL PO Take 1,000 mg by mouth daily.   losartan  (COZAAR ) 25 MG tablet Take 1 tablet (25 mg total) by mouth daily.   MAGNESIUM-OXIDE 400 (240 Mg) MG tablet Take 1 tablet by mouth daily.   montelukast (SINGULAIR) 10 MG tablet Take 10 mg by mouth daily.   omeprazole (PRILOSEC) 40 MG capsule Take 40 mg by mouth daily.   OVER THE COUNTER MEDICATION Take 1 tablet by mouth daily. Tylenol ES   SYMBICORT 160-4.5 MCG/ACT inhaler  Inhale 2 puffs into the lungs 2 (two) times daily.   tamsulosin (FLOMAX) 0.4 MG CAPS capsule Take 0.4 mg by mouth daily.   zolpidem (AMBIEN) 10 MG tablet Take 10 mg by mouth at bedtime.   [DISCONTINUED] furosemide  (LASIX ) 40 MG tablet Take 1 tablet (40 mg total) by mouth 3 (three) times daily.     Allergies:   Grass pollen(k-o-r-t-swt vern), Other, and Pollen extract   Social History   Socioeconomic History   Marital status: Married    Spouse name: Not on file   Number of children: Not on file   Years of education: Not on file   Highest education level: Not on file  Occupational History   Not on file  Tobacco Use   Smoking status: Former    Types: Cigarettes   Smokeless tobacco: Never  Vaping Use   Vaping status: Former  Substance and Sexual Activity   Alcohol use: Yes    Alcohol/week: 2.0 - 3.0 standard drinks of alcohol    Types: 2 - 3 Glasses of wine per week    Comment: daily   Drug use: No   Sexual activity: Not on file  Other Topics Concern   Not on file  Social History Narrative   Not on file   Social Drivers of Health   Financial Resource Strain: Not on file  Food Insecurity: Low Risk  (12/29/2023)   Received from [facility]   Hunger Vital Sign    Within the past 12 months, you worried that your food would run out before you got money to buy more: Never true    Within the past 12 months, the food you bought just didn't last and you didn't have money to get more. : Never true  Transportation Needs: No Transportation Needs (12/29/2023)   Received from [employer]    In the past 12 months, has lack of reliable transportation kept you from medical appointments, meetings, work or from getting things needed for daily living? : No  Physical Activity: Not on file  Stress: Not on file  Social Connections: Not on file     Family History: The patient's family history includes Stroke in his mother. ROS:   Please see the history of present  illness.    All 14 point review of systems negative except as described per history of present illness  EKGs/Labs/Other Studies Reviewed:         Recent Labs: 09/17/2023: ALT 38 11/23/2023: BUN 13; Creatinine, Ser 0.86; NT-Pro BNP 624; Potassium 4.3; Sodium 144  Recent Lipid Panel    Component Value Date/Time   CHOL 114 02/01/2022 1546   TRIG 87 02/01/2022 1546   HDL 43 02/01/2022 1546   CHOLHDL 2.7 02/01/2022 1546   LDLCALC 54 02/01/2022 1546    Physical  Exam:    VS:  BP 102/60   Pulse 80   Ht 5' 6 (1.676 m)   Wt 254 lb 6.4 oz (115.4 kg)   SpO2 96%   BMI 41.06 kg/m     Wt Readings from Last 3 Encounters:  01/16/24 254 lb 6.4 oz (115.4 kg)  11/23/23 254 lb (115.2 kg)  05/25/23 259 lb 12.8 oz (117.8 kg)     GEN:  Well nourished, well developed in no acute distress HEENT: Normal NECK: No JVD; No carotid bruits LYMPHATICS: No lymphadenopathy CARDIAC: RRR, no murmurs, no rubs, no gallops RESPIRATORY:  Clear to auscultation without rales, wheezing or rhonchi  ABDOMEN: Soft, non-tender, non-distended MUSCULOSKELETAL:  No edema; No deformity  SKIN: Warm and dry LOWER EXTREMITIES: no swelling NEUROLOGIC:  Alert and oriented x 3 PSYCHIATRIC:  Normal affect   ASSESSMENT:    1. Coronary artery disease involving native coronary artery of native heart without angina pectoris   2. Coronary artery aneurysm   3. Benign hypertensive heart disease with CHF (congestive heart failure) (HCC)   4. OSA (obstructive sleep apnea)   5. Mixed hyperlipidemia    PLAN:    In order of problems listed above:  Coronary disease stable from that point review no recent issues. Leading problem right now obviously his GI bleed.  His hemoglobin is low, he is scheduled to see Dr. Boby next week for blood checked I will also put CBC in the computer so anytime he wants he can have CBC to recheck.  I will refer him here in town to Dr. Larene GI specialist.  He is not too thrilled about  [facility].  I told him if he started feeling weak tired more all passed out he need to go immediately to the emergency room. History of cardiomyopathy 45% ejection fraction now today blood pressure slightly low I have difficulty putting him on appropriate medication will continue monitoring I hope with improvement in his blood count he start feeling better.   Medication Adjustments/Labs and Tests Ordered: Current medicines are reviewed at length with the patient today.  Concerns regarding medicines are outlined above.  No orders of the defined types were placed in this encounter.  Medication changes:  Meds ordered this encounter  Medications   furosemide  (LASIX ) 40 MG tablet    Sig: Take 1 tablet (40 mg total) by mouth daily.    Signed, Lamar DOROTHA Fitch, MD, [facility] 01/16/2024 1:59 PM    [facility]

## 2024-01-16 NOTE — Patient Instructions (Signed)
 Medication Instructions:  Your physician recommends that you continue on your current medications as directed. Please refer to the Current Medication list given to you today.  *If you need a refill on your cardiac medications before your next appointment, please call your pharmacy*   Lab Work: Your physician recommends that you return for lab work in: as needed for CBC You can come Monday through Friday 8:30 am to 12:00 pm and 1:15 to 4:30. You do not need to make an appointment as the order has already been placed.   If you have labs (blood work) drawn today and your tests are completely normal, you will receive your results only by: MyChart Message (if you have MyChart) OR A paper copy in the mail If you have any lab test that is abnormal or we need to change your treatment, we will call you to review the results.   Testing/Procedures: None ordered   Follow-Up: At [facility], you and your health needs are our priority.  As part of our continuing mission to provide you with exceptional heart care, we have created designated Provider Care Teams.  These Care Teams include your primary Cardiologist (physician) and Advanced Practice Providers (APPs -  Physician Assistants and Nurse Practitioners) who all work together to provide you with the care you need, when you need it.  We recommend signing up for the patient portal called MyChart.  Sign up information is provided on this After Visit Summary.  MyChart is used to connect with patients for Virtual Visits (Telemedicine).  Patients are able to view lab/test results, encounter notes, upcoming appointments, etc.  Non-urgent messages can be sent to your provider as well.   To learn more about what you can do with MyChart, go to [email or web address].    Your next appointment:   3 week(s)  The format for your next appointment:   In Person  Provider:   Lamar Fitch, MD    Other Instructions none  Important  Information About Sugar

## 2024-01-18 DIAGNOSIS — D509 Iron deficiency anemia, unspecified: Secondary | ICD-10-CM | POA: Diagnosis not present

## 2024-01-21 ENCOUNTER — Telehealth: Payer: Self-pay | Admitting: Cardiology

## 2024-01-21 ENCOUNTER — Other Ambulatory Visit: Payer: Self-pay

## 2024-01-21 ENCOUNTER — Emergency Department (HOSPITAL_BASED_OUTPATIENT_CLINIC_OR_DEPARTMENT_OTHER)

## 2024-01-21 ENCOUNTER — Encounter (HOSPITAL_BASED_OUTPATIENT_CLINIC_OR_DEPARTMENT_OTHER): Payer: Self-pay

## 2024-01-21 ENCOUNTER — Inpatient Hospital Stay (HOSPITAL_BASED_OUTPATIENT_CLINIC_OR_DEPARTMENT_OTHER)
Admission: EM | Admit: 2024-01-21 | Discharge: 2024-01-26 | DRG: 291 | Disposition: A | Discharge status: Home / Self Care | Attending: Internal Medicine | Admitting: Internal Medicine

## 2024-01-21 DIAGNOSIS — I872 Venous insufficiency (chronic) (peripheral): Secondary | ICD-10-CM | POA: Diagnosis not present

## 2024-01-21 DIAGNOSIS — D649 Anemia, unspecified: Secondary | ICD-10-CM

## 2024-01-21 DIAGNOSIS — I252 Old myocardial infarction: Secondary | ICD-10-CM | POA: Diagnosis not present

## 2024-01-21 DIAGNOSIS — Z823 Family history of stroke: Secondary | ICD-10-CM

## 2024-01-21 DIAGNOSIS — Z9981 Dependence on supplemental oxygen: Secondary | ICD-10-CM | POA: Diagnosis not present

## 2024-01-21 DIAGNOSIS — E66813 Obesity, class 3: Secondary | ICD-10-CM | POA: Diagnosis present

## 2024-01-21 DIAGNOSIS — R0602 Shortness of breath: Secondary | ICD-10-CM | POA: Diagnosis not present

## 2024-01-21 DIAGNOSIS — I10 Essential (primary) hypertension: Secondary | ICD-10-CM | POA: Diagnosis present

## 2024-01-21 DIAGNOSIS — Z8719 Personal history of other diseases of the digestive system: Secondary | ICD-10-CM | POA: Diagnosis not present

## 2024-01-21 DIAGNOSIS — I700 Atherosclerosis of aorta: Secondary | ICD-10-CM | POA: Diagnosis not present

## 2024-01-21 DIAGNOSIS — D509 Iron deficiency anemia, unspecified: Secondary | ICD-10-CM | POA: Diagnosis present

## 2024-01-21 DIAGNOSIS — Z87891 Personal history of nicotine dependence: Secondary | ICD-10-CM

## 2024-01-21 DIAGNOSIS — I5043 Acute on chronic combined systolic (congestive) and diastolic (congestive) heart failure: Secondary | ICD-10-CM

## 2024-01-21 DIAGNOSIS — J811 Chronic pulmonary edema: Secondary | ICD-10-CM | POA: Diagnosis not present

## 2024-01-21 DIAGNOSIS — K2270 Barrett's esophagus without dysplasia: Secondary | ICD-10-CM | POA: Diagnosis present

## 2024-01-21 DIAGNOSIS — I959 Hypotension, unspecified: Secondary | ICD-10-CM | POA: Diagnosis present

## 2024-01-21 DIAGNOSIS — J9610 Chronic respiratory failure, unspecified whether with hypoxia or hypercapnia: Secondary | ICD-10-CM | POA: Diagnosis present

## 2024-01-21 DIAGNOSIS — J90 Pleural effusion, not elsewhere classified: Secondary | ICD-10-CM | POA: Diagnosis not present

## 2024-01-21 DIAGNOSIS — Z951 Presence of aortocoronary bypass graft: Secondary | ICD-10-CM

## 2024-01-21 DIAGNOSIS — I110 Hypertensive heart disease with heart failure: Principal | ICD-10-CM | POA: Diagnosis present

## 2024-01-21 DIAGNOSIS — I509 Heart failure, unspecified: Secondary | ICD-10-CM | POA: Diagnosis present

## 2024-01-21 DIAGNOSIS — Z79899 Other long term (current) drug therapy: Secondary | ICD-10-CM | POA: Diagnosis not present

## 2024-01-21 DIAGNOSIS — K5730 Diverticulosis of large intestine without perforation or abscess without bleeding: Secondary | ICD-10-CM | POA: Diagnosis not present

## 2024-01-21 DIAGNOSIS — I7143 Infrarenal abdominal aortic aneurysm, without rupture: Secondary | ICD-10-CM | POA: Diagnosis not present

## 2024-01-21 DIAGNOSIS — Z9861 Coronary angioplasty status: Secondary | ICD-10-CM

## 2024-01-21 DIAGNOSIS — Z7982 Long term (current) use of aspirin: Secondary | ICD-10-CM | POA: Diagnosis not present

## 2024-01-21 DIAGNOSIS — I890 Lymphedema, not elsewhere classified: Secondary | ICD-10-CM | POA: Diagnosis present

## 2024-01-21 DIAGNOSIS — J449 Chronic obstructive pulmonary disease, unspecified: Secondary | ICD-10-CM | POA: Diagnosis not present

## 2024-01-21 DIAGNOSIS — E119 Type 2 diabetes mellitus without complications: Secondary | ICD-10-CM | POA: Diagnosis present

## 2024-01-21 DIAGNOSIS — E785 Hyperlipidemia, unspecified: Secondary | ICD-10-CM | POA: Diagnosis present

## 2024-01-21 DIAGNOSIS — I255 Ischemic cardiomyopathy: Secondary | ICD-10-CM | POA: Diagnosis present

## 2024-01-21 DIAGNOSIS — G4733 Obstructive sleep apnea (adult) (pediatric): Secondary | ICD-10-CM | POA: Diagnosis present

## 2024-01-21 DIAGNOSIS — J9611 Chronic respiratory failure with hypoxia: Secondary | ICD-10-CM

## 2024-01-21 DIAGNOSIS — Z7951 Long term (current) use of inhaled steroids: Secondary | ICD-10-CM | POA: Diagnosis not present

## 2024-01-21 DIAGNOSIS — Z6841 Body Mass Index (BMI) 40.0 and over, adult: Secondary | ICD-10-CM | POA: Diagnosis not present

## 2024-01-21 DIAGNOSIS — I2510 Atherosclerotic heart disease of native coronary artery without angina pectoris: Secondary | ICD-10-CM | POA: Diagnosis present

## 2024-01-21 DIAGNOSIS — J9811 Atelectasis: Secondary | ICD-10-CM | POA: Diagnosis not present

## 2024-01-21 DIAGNOSIS — R609 Edema, unspecified: Secondary | ICD-10-CM | POA: Diagnosis not present

## 2024-01-21 DIAGNOSIS — J4489 Other specified chronic obstructive pulmonary disease: Secondary | ICD-10-CM | POA: Diagnosis present

## 2024-01-21 DIAGNOSIS — N400 Enlarged prostate without lower urinary tract symptoms: Secondary | ICD-10-CM | POA: Diagnosis present

## 2024-01-21 DIAGNOSIS — I5033 Acute on chronic diastolic (congestive) heart failure: Secondary | ICD-10-CM | POA: Diagnosis not present

## 2024-01-21 DIAGNOSIS — Z96641 Presence of right artificial hip joint: Secondary | ICD-10-CM | POA: Diagnosis present

## 2024-01-21 DIAGNOSIS — I5023 Acute on chronic systolic (congestive) heart failure: Secondary | ICD-10-CM | POA: Diagnosis present

## 2024-01-21 DIAGNOSIS — J410 Simple chronic bronchitis: Secondary | ICD-10-CM | POA: Diagnosis not present

## 2024-01-21 DIAGNOSIS — Z955 Presence of coronary angioplasty implant and graft: Secondary | ICD-10-CM

## 2024-01-21 LAB — PHOSPHORUS: Phosphorus: 3.2 mg/dL (ref 2.5–4.6)

## 2024-01-21 LAB — BASIC METABOLIC PANEL WITH GFR
Anion gap: 12 (ref 5–15)
BUN: 17 mg/dL (ref 8–23)
CO2: 26 mmol/L (ref 22–32)
Calcium: 9.5 mg/dL (ref 8.9–10.3)
Chloride: 104 mmol/L (ref 98–111)
Creatinine, Ser: 0.85 mg/dL (ref 0.61–1.24)
GFR, Estimated: >60 mL/min (ref >60)
Glucose, Bld: 102 mg/dL — ABNORMAL HIGH (ref 70–99)
Potassium: 3.9 mmol/L (ref 3.5–5.1)
Sodium: 142 mmol/L (ref 135–145)

## 2024-01-21 LAB — CBC
HCT: 27.4 % — ABNORMAL LOW (ref 39.0–52.0)
Hemoglobin: 7.9 g/dL — ABNORMAL LOW (ref 13.0–17.0)
MCH: 24 pg — ABNORMAL LOW (ref 26.0–34.0)
MCHC: 28.8 g/dL — ABNORMAL LOW (ref 30.0–36.0)
MCV: 83.3 fL (ref 80.0–100.0)
Platelets: 241 K/uL (ref 150–400)
RBC: 3.29 MIL/uL — ABNORMAL LOW (ref 4.22–5.81)
RDW: 22.5 % — ABNORMAL HIGH (ref 11.5–15.5)
WBC: 8.1 K/uL (ref 4.0–10.5)
nRBC: 0 % (ref 0.0–0.2)

## 2024-01-21 LAB — PRO BRAIN NATRIURETIC PEPTIDE: Pro Brain Natriuretic Peptide: 750 pg/mL — ABNORMAL HIGH (ref <300.0)

## 2024-01-21 LAB — TROPONIN T, HIGH SENSITIVITY
Troponin T High Sensitivity: 36 ng/L — ABNORMAL HIGH (ref 0–19)
Troponin T High Sensitivity: 36 ng/L — ABNORMAL HIGH (ref 0–19)

## 2024-01-21 LAB — MAGNESIUM: Magnesium: 1.9 mg/dL (ref 1.7–2.4)

## 2024-01-21 MED ORDER — TAMSULOSIN HCL 0.4 MG PO CAPS
0.4000 mg | ORAL_CAPSULE | Freq: Every day | ORAL | Status: DC
Start: 2024-01-21 — End: 2024-01-26
  Administered 2024-01-21 – 2024-01-26 (×6): 0.4 mg via ORAL
  Filled 2024-01-21 (×6): qty 1

## 2024-01-21 MED ORDER — PANTOPRAZOLE SODIUM 40 MG PO TBEC
40.0000 mg | DELAYED_RELEASE_TABLET | Freq: Every day | ORAL | Status: DC
  Administered 2024-01-22 – 2024-01-26 (×5): 40 mg via ORAL
  Filled 2024-01-21 (×5): qty 1

## 2024-01-21 MED ORDER — ZOLPIDEM TARTRATE 5 MG PO TABS
10.0000 mg | ORAL_TABLET | Freq: Every day | ORAL | Status: DC
  Administered 2024-01-21 – 2024-01-25 (×5): 10 mg via ORAL
  Filled 2024-01-21 (×5): qty 2

## 2024-01-21 MED ORDER — MAGNESIUM OXIDE -MG SUPPLEMENT 400 (240 MG) MG PO TABS
400.0000 mg | ORAL_TABLET | Freq: Every day | ORAL | Status: DC
Start: 2024-01-21 — End: 2024-01-26
  Administered 2024-01-21 – 2024-01-26 (×6): 400 mg via ORAL
  Filled 2024-01-21 (×6): qty 1

## 2024-01-21 MED ORDER — FLUTICASONE FUROATE-VILANTEROL 100-25 MCG/ACT IN AEPB
1.0000 | INHALATION_SPRAY | Freq: Every day | RESPIRATORY_TRACT | Status: DC
  Filled 2024-01-21 (×4): qty 28

## 2024-01-21 MED ORDER — CLOPIDOGREL BISULFATE 75 MG PO TABS
75.0000 mg | ORAL_TABLET | Freq: Every day | ORAL | Status: DC
  Administered 2024-01-21 – 2024-01-26 (×6): 75 mg via ORAL
  Filled 2024-01-21 (×6): qty 1

## 2024-01-21 MED ORDER — FUROSEMIDE 10 MG/ML IJ SOLN
60.0000 mg | Freq: Two times a day (BID) | INTRAMUSCULAR | Status: DC
  Administered 2024-01-21 – 2024-01-22 (×2): 60 mg via INTRAVENOUS
  Filled 2024-01-21 (×2): qty 6

## 2024-01-21 MED ORDER — ATORVASTATIN CALCIUM 80 MG PO TABS
80.0000 mg | ORAL_TABLET | Freq: Every day | ORAL | Status: DC
  Administered 2024-01-21 – 2024-01-26 (×6): 80 mg via ORAL
  Filled 2024-01-21: qty 2
  Filled 2024-01-21 (×5): qty 1

## 2024-01-21 MED ORDER — LOSARTAN POTASSIUM 25 MG PO TABS
25.0000 mg | ORAL_TABLET | Freq: Every day | ORAL | Status: DC
  Administered 2024-01-21 – 2024-01-22 (×2): 25 mg via ORAL
  Filled 2024-01-21 (×2): qty 1

## 2024-01-21 MED ORDER — FERROUS SULFATE 325 (65 FE) MG PO TABS
325.0000 mg | ORAL_TABLET | Freq: Two times a day (BID) | ORAL | Status: DC
Start: 2024-01-21 — End: 2024-01-26
  Administered 2024-01-21 – 2024-01-26 (×9): 325 mg via ORAL
  Filled 2024-01-21 (×10): qty 1

## 2024-01-21 MED ORDER — ALBUTEROL SULFATE HFA 108 (90 BASE) MCG/ACT IN AERS
2.0000 | INHALATION_SPRAY | RESPIRATORY_TRACT | Status: DC | PRN
Start: 2024-01-21 — End: 2024-01-21
  Administered 2024-01-21: 2 via RESPIRATORY_TRACT
  Filled 2024-01-21: qty 6.7

## 2024-01-21 MED ORDER — ASPIRIN 81 MG PO TBEC
81.0000 mg | DELAYED_RELEASE_TABLET | Freq: Every day | ORAL | Status: DC
  Administered 2024-01-21 – 2024-01-23 (×3): 81 mg via ORAL
  Filled 2024-01-21 (×4): qty 1

## 2024-01-21 MED ORDER — ALBUTEROL SULFATE (2.5 MG/3ML) 0.083% IN NEBU: 2.5000 mg | INHALATION_SOLUTION | RESPIRATORY_TRACT | Status: DC | PRN

## 2024-01-21 MED ORDER — MONTELUKAST SODIUM 10 MG PO TABS
10.0000 mg | ORAL_TABLET | Freq: Every day | ORAL | Status: DC
  Administered 2024-01-21 – 2024-01-26 (×6): 10 mg via ORAL
  Filled 2024-01-21 (×6): qty 1

## 2024-01-21 MED ORDER — FINASTERIDE 5 MG PO TABS
5.0000 mg | ORAL_TABLET | Freq: Every day | ORAL | Status: DC
  Administered 2024-01-22 – 2024-01-26 (×5): 5 mg via ORAL
  Filled 2024-01-21 (×5): qty 1

## 2024-01-21 MED ORDER — FUROSEMIDE 10 MG/ML IJ SOLN
40.0000 mg | Freq: Once | INTRAMUSCULAR | Status: AC
  Administered 2024-01-21: 40 mg via INTRAVENOUS
  Filled 2024-01-21: qty 4

## 2024-01-21 NOTE — Telephone Encounter (Signed)
 Stat call from call center with pt that is short of breath and gained 12 lbs in fluid. After discharge follow up nurse recommended that he go to ER for evaluation. Advised that Dr. Bernie would advise the same for IV diuresis. Advised any ED of his choice. Pt verbalized understanding and had no further questions.

## 2024-01-21 NOTE — ED Provider Notes (Signed)
 [place] [facility] AT [facility] Provider Note   CSN: [id number] Arrival date & time: 01/21/24  1302     Patient presents with: Shortness of Breath   Phillip Dunn is a 73 y.o. male.    Shortness of Breath    Patient has history of adrenal disorder, CHF, coronary artery disease, obstructive sleep apnea, diabetes, myocardial infarction, who was recently hospitalized for a GI bleed.  Patient is chronically on home oxygen .  Patient was admitted on September 26 and was discharged on October 3.  During the patient's hospitalization he was treated for acute blood loss anemia associated with a duodenal ulcer was also noted to have acute CHF.  Patient states he has been having trouble with increasing difficulty with his breathing and chest tightness.  Patient states he has gained almost 12 pounds of fluid.  Patient saw his cardiologist on the 15th.  He mention the persistent symptoms at that time.  Patient was referred to outpatient GI.  Patient denies any fevers.  Prior to Admission medications   Medication Sig Start Date End Date Taking? Authorizing Provider  albuterol  (VENTOLIN  HFA) 108 (90 Base) MCG/ACT inhaler Inhale 2 puffs into the lungs every 4 (four) hours as needed for wheezing. 07/19/16   [provider]  aspirin EC 81 MG tablet Take 81 mg by mouth daily.    [provider]  atorvastatin (LIPITOR) 80 MG tablet Take 80 mg by mouth daily.    [provider]  clopidogrel  (PLAVIX ) 75 MG tablet TAKE 1 TABLET EVERY DAY 10/04/23   Krasowski, Robert J, MD  Coenzyme Q10 (COQ-10) 10 MG CAPS Take 10 mg by mouth daily.    [provider]  ferrous sulfate 325 (65 FE) MG EC tablet Take 325 mg by mouth 2 (two) times daily.    [provider]  finasteride  (PROSCAR ) 5 MG tablet Take 1 tablet (5 mg total) by mouth daily. 03/12/23   Krasowski, Robert J, MD  furosemide  (LASIX ) 40 MG tablet Take 1 tablet (40 mg total) by mouth daily.  01/16/24 04/15/24  Krasowski, Robert J, MD  IPRATROPIUM BROMIDE IN Place 1 spray into both nostrils 2 (two) times daily.    [provider]  KRILL OIL PO Take 1,000 mg by mouth daily.    [provider]  losartan  (COZAAR ) 25 MG tablet Take 1 tablet (25 mg total) by mouth daily. 12/21/23 03/20/24  Krasowski, Robert J, MD  MAGNESIUM-OXIDE 400 (240 Mg) MG tablet Take 1 tablet by mouth daily. 01/11/24   [provider]  montelukast (SINGULAIR) 10 MG tablet Take 10 mg by mouth daily. 11/07/22   [provider]  omeprazole (PRILOSEC) 40 MG capsule Take 40 mg by mouth daily. 01/07/24   [provider]  OVER THE COUNTER MEDICATION Take 1 tablet by mouth daily. Tylenol ES    [provider]  SYMBICORT 160-4.5 MCG/ACT inhaler Inhale 2 puffs into the lungs 2 (two) times daily. 11/05/23   [provider]  tamsulosin (FLOMAX) 0.4 MG CAPS capsule Take 0.4 mg by mouth daily.    [provider]  zolpidem (AMBIEN) 10 MG tablet Take 10 mg by mouth at bedtime. 10/30/22   [provider]    Allergies: Grass pollen([id number] vern), Other, and Pollen extract    Review of Systems  Respiratory:  Positive for shortness of breath.     Updated Vital Signs BP 136/83   Pulse 61   Temp 98.4 F (36.9 C)  Resp 18   Wt 116.4 kg   SpO2 100%   BMI 41.43 kg/m   Physical Exam Vitals and nursing note reviewed.  Constitutional:      Appearance: He is well-developed. He is ill-appearing.     Comments: Elevated BMI  HENT:     Head: Normocephalic and atraumatic.     Right Ear: External ear normal.     Left Ear: External ear normal.  Eyes:     General: No scleral icterus.       Right eye: No discharge.        Left eye: No discharge.     Conjunctiva/sclera: Conjunctivae normal.  Neck:     Trachea: No tracheal deviation.  Cardiovascular:     Rate and Rhythm: Normal rate and regular rhythm.  Pulmonary:     Effort: Pulmonary effort is  normal. No respiratory distress.     Breath sounds: No stridor. Rales present. No wheezing.  Abdominal:     General: Bowel sounds are normal. There is no distension.     Palpations: Abdomen is soft.     Tenderness: There is no abdominal tenderness. There is no guarding or rebound.  Musculoskeletal:        General: No tenderness or deformity.     Cervical back: Neck supple.     Right lower leg: Edema present.     Left lower leg: Edema present.  Skin:    General: Skin is warm and dry.     Findings: No rash.  Neurological:     General: No focal deficit present.     Mental Status: He is alert.     Cranial Nerves: No cranial nerve deficit, dysarthria or facial asymmetry.     Sensory: No sensory deficit.     Motor: No abnormal muscle tone or seizure activity.     Coordination: Coordination normal.  Psychiatric:        Mood and Affect: Mood normal.     (all labs ordered are listed, but only abnormal results are displayed) Labs Reviewed  BASIC METABOLIC PANEL WITH GFR - Abnormal; Notable for the following components:      Result Value   Glucose, Bld 102 (*)    All other components within normal limits  CBC - Abnormal; Notable for the following components:   RBC 3.29 (*)    Hemoglobin 7.9 (*)    HCT 27.4 (*)    MCH 24.0 (*)    MCHC 28.8 (*)    RDW 22.5 (*)    All other components within normal limits  PRO BRAIN NATRIURETIC PEPTIDE - Abnormal; Notable for the following components:   Pro Brain Natriuretic Peptide 750.0 (*)    All other components within normal limits  TROPONIN T, HIGH SENSITIVITY - Abnormal; Notable for the following components:   Troponin T High Sensitivity 36 (*)    All other components within normal limits  TROPONIN T, HIGH SENSITIVITY    EKG: None  Radiology: DG Chest 2 View Result Date: 01/21/2024 CLINICAL DATA:  Short of breath EXAM: CHEST - 2 VIEW COMPARISON:  09/18/2023 FINDINGS: Frontal and lateral views of the chest demonstrate postsurgical  changes from median sternotomy. The cardiac silhouette is enlarged but stable. There is background emphysema, with superimposed increased interstitial prominence and trace pleural effusion since prior study, consistent with mild fluid overload. No focal consolidation or pneumothorax. No acute bony abnormalities. IMPRESSION: 1. Mild interstitial edema superimposed upon background emphysema. 2. Trace bilateral pleural effusions. Electronically Signed  By: Ozell Daring M.D.   On: 01/21/2024 15:01     Procedures   Medications Ordered in the ED  albuterol  (VENTOLIN  HFA) 108 (90 Base) MCG/ACT inhaler 2 puff (has no administration in time range)  atorvastatin (LIPITOR) tablet 80 mg (has no administration in time range)  ferrous sulfate EC tablet 325 mg (has no administration in time range)  finasteride  (PROSCAR ) tablet 5 mg (has no administration in time range)  furosemide  (LASIX ) injection 40 mg (40 mg Intravenous Given 01/21/24 1509)    Clinical Course as of 01/21/24 1537  Mon Jan 21, 2024  1336 hemoGlobin was 8.1 on October 3 [JK]  1450 Pro Brain natriuretic peptide(!) BNP increased 750 [JK]  1450 Troponin T, High Sensitivity(!) Troponin slight elevated since 36 [JK]  1536 Case discussed with Dr Lanetta regarding admission [JK]    Clinical Course User Index [JK] Randol Simmonds, MD                                 Medical Decision Making Problems Addressed: Acute on chronic congestive heart failure, unspecified heart failure type [facility]): acute illness or injury that poses a threat to life or bodily functions Anemia, unspecified type: chronic illness or injury with exacerbation, progression, or side effects of treatment  Amount and/or Complexity of Data Reviewed Labs: ordered. Decision-making details documented in ED Course. Radiology: ordered and independent interpretation performed.  Risk OTC drugs. Prescription drug management. Decision regarding hospitalization.   Patient  presented to the ED for evaluation of increasing swelling as well as shortness of breath.  Patient also reported chest tightness.  He does have history of coronary artery disease as well as CHF.  He was recently hospitalized for GI bleeding and had blood transfusions.  Patient's laboratory testing shows persistent anemia with hemoglobin of 7.9.  However this is similar to his discharge hemoglobin of 8.1 on October 3.  No significant electrolyte normalities.  BNP and troponin are slightly elevated.  Preliminary review of chest x-ray suggest cardiomegaly and peripheral edema.   I think the patient would benefit from transfer and admission to a hospital for diuresis and continued monitoring of his hemoglobin.  I have ordered Lasix  IV.  I will consult the medical service for admission     Final diagnoses:  Acute on chronic congestive heart failure, unspecified heart failure type (HCC)  Anemia, unspecified type    ED Discharge Orders     None          Randol Simmonds, MD 01/21/24 1537

## 2024-01-21 NOTE — ED Triage Notes (Addendum)
 Recently discharged earlier this month from [facility] for bleeding ulcer, received 5 units blood.  Has gained 12lbs of fluid, increased shortness of breath, BLE edema since leaving hospital, chest tightness.  Wears 4L O2 at baseline

## 2024-01-21 NOTE — Telephone Encounter (Signed)
 Pt c/o swelling/edema: STAT if pt has developed SOB within 24 hours  If swelling, where is the swelling located? Legs   How much weight have you gained and in what time span? 12 lbs since discharge   Have you gained 2 pounds in a day or 5 pounds in a week? Yes   Do you have a log of your daily weights (if so, list)?  Did not write them down   Are you currently taking a fluid pill? Yes   Are you currently SOB? Yes   Have you traveled recently in a car or plane for an extended period of time? No   [facility] f/u care team from [facility] is recommending that pt be admitted into the hospital due to his CHF worsening. Please advise. He states if Dr. MARLA agrees for hospital admission that he recommend [facility].

## 2024-01-21 NOTE — H&P (Signed)
 History and Physical  Phillip Dunn FMW:[id number] DOB: [birth year] DOA: 01/21/2024  PCP: Keren Vicenta BRAVO, MD   Chief Complaint: Shortness of breath  HPI: Phillip Dunn is a 73 y.o. male with medical history significant for chronic combined systolic and diastolic heart failure, chronic hypoxic respiratory failure on 4 L [place], obesity, T2DM, HLD, HTN, MI/CAD s/p CABG and stenting, lumbar radiculopathy, OSA on CPAP, COPD, anemia, venous insufficiency, chronic leg swelling, and a recent hospitalization for GI bleed at outside hospital who presented to [facility] ED for evaluation of shortness of breath.  [facility] ED Course: Initial vitals show patient afebrile, normotensive, SpO2 100% on 4 L [place]. Initial labs significant for Hgb 7.9, normal renal function, proBNP 750, troponin 36-36, mag 1.9, Phos 3.2. CXR shows mild interstitial edema and trace bilateral pleural effusions. Pt received Lasix  40 mg x 1.  Patient was admitted to [facility] service and transferred to [facility].  Review of Systems: Please see HPI for pertinent positives and negatives. A complete 10 system review of systems are otherwise negative.  Past Medical History:  Diagnosis Date   Abnormal endocrine laboratory test finding 08/02/2021   Adrenal disorder    Asthma    Benign hypertensive heart disease with CHF (congestive heart failure) (HCC)    Bradycardia    Carpal tunnel syndrome of right wrist 05/08/2019   Class 2 severe obesity with serious comorbidity and body mass index (BMI) of 39.0 to 39.9 in adult 08/08/2021   Coronary artery aneurysm 06/19/2017   Measuring 14 mm on CT from February 2019   Coronary artery disease    Coronary artery disease involving native coronary artery of native heart without angina pectoris 12/08/2014   Overview:  Multiple stents to circumflex artery in March 18, 2014  Formatting of this note might be different from the original. 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 His smoking use included cigarettes. He has never used smokeless tobacco. He reports current alcohol use of about 2.0 - 3.0 standard drinks of alcohol per week. He reports that he does not use drugs.  Allergies  Allergen Reactions   Grass Pollen(K-O-R-T-Swt Vern) Other (See Comments)   Other Other (See Comments)    Pollen    Pollen Extract     Family History  Problem Relation Age of Onset   Stroke Mother      Prior to Admission medications   Medication Sig Start Date End Date Taking? Authorizing Provider  albuterol  (VENTOLIN  HFA) 108 (90 Base)  MCG/ACT inhaler Inhale 2 puffs into the lungs every 4 (four) hours as needed for wheezing. 07/19/16  Yes [provider]  aspirin EC 81 MG tablet Take 81 mg by mouth daily.   Yes [provider]  atorvastatin (LIPITOR) 80 MG tablet Take 80 mg by mouth daily.   Yes [provider]  clopidogrel  (PLAVIX ) 75 MG tablet TAKE 1 TABLET EVERY DAY 10/04/23  Yes Krasowski, Robert J, MD  Coenzyme Q10 (COQ-10) 10 MG CAPS Take 10 mg by mouth daily.   Yes  [provider]  ferrous sulfate 325 (65 FE) MG EC tablet Take 325 mg by mouth 2 (two) times daily.   Yes [provider]  finasteride  (PROSCAR ) 5 MG tablet Take 1 tablet (5 mg total) by mouth daily. 03/12/23  Yes Krasowski, Robert J, MD  furosemide  (LASIX ) 40 MG tablet Take 1 tablet (40 mg total) by mouth daily. Patient taking differently: Take 120 mg by mouth daily. 01/16/24 04/15/24 Yes Krasowski, Robert J, MD  IPRATROPIUM BROMIDE IN Place 1 spray into both nostrils 2 (two) times daily.   Yes [provider]  KRILL OIL PO Take 1,000 mg by mouth daily.   Yes [provider]  losartan  (COZAAR ) 25 MG tablet Take 1 tablet (25 mg total) by mouth daily. 12/21/23 03/20/24 Yes Krasowski, Robert J, MD  MAGNESIUM-OXIDE 400 (240 Mg) MG tablet Take 1 tablet by mouth daily. 01/11/24  Yes [provider]  montelukast (SINGULAIR) 10 MG tablet Take 10 mg by mouth daily. 11/07/22  Yes [provider]  omeprazole (PRILOSEC) 40 MG capsule Take 40 mg by mouth 2 (two) times daily. 01/07/24  Yes [provider]  OVER THE COUNTER MEDICATION Take 1 tablet by mouth daily. Tylenol ES   Yes [provider]  SYMBICORT 160-4.5 MCG/ACT inhaler Inhale 2 puffs into the lungs 2 (two) times daily. 11/05/23  Yes [provider]  tamsulosin (FLOMAX) 0.4 MG CAPS capsule Take 0.4 mg by mouth daily.   Yes [provider]  zolpidem (AMBIEN) 10 MG tablet Take 10 mg by mouth at bedtime. 10/30/22  Yes [provider]    Physical Exam: BP 136/83   Pulse 61   Temp 98.4 F (36.9 C)   Resp 18   Wt 116.4 kg   SpO2 100%   BMI 41.43 kg/m  General: Pleasant, well-appearing *** laying in bed. No acute distress. HEENT: [place]/AT. Anicteric sclera CV: RRR. No murmurs, rubs, or gallops. No LE edema Pulmonary: Lungs CTAB. Normal effort. No wheezing or rales. Abdominal: Soft, nontender, nondistended. Normal bowel sounds. Extremities: Palpable radial and  DP pulses. Normal ROM. Skin: Warm and dry. No obvious rash or lesions. Neuro: A&Ox3. Moves all extremities. Normal sensation to light touch. No focal deficit. Psych: Normal mood and affect          Labs on Admission:  Basic Metabolic Panel: Recent Labs  Lab 01/21/24 1316  NA 142  K 3.9  CL 104  CO2 26  GLUCOSE 102*  BUN 17  CREATININE 0.85  CALCIUM 9.5   Liver Function Tests: No results for input(s): AST, ALT, ALKPHOS, BILITOT, PROT, ALBUMIN in the last 168 hours. No results for input(s): LIPASE, AMYLASE in the last 168 hours. No results for input(s): AMMONIA in the last 168 hours. CBC: Recent Labs  Lab 01/21/24 1316  WBC 8.1  HGB 7.9*  HCT 27.4*  MCV 83.3  PLT 241   Cardiac Enzymes: No results for input(s): CKTOTAL, CKMB, CKMBINDEX,  TROPONINI in the last 168 hours. BNP (last 3 results) No results for input(s): BNP in the last 8760 hours.  ProBNP (last 3 results) Recent Labs    11/23/23 0926 01/21/24 1316  PROBNP 624* 750.0*    CBG: No results for input(s): GLUCAP in the last 168 hours.  Radiological Exams on Admission: DG Chest 2 View Result Date: 01/21/2024 CLINICAL DATA:  Short of breath EXAM: CHEST - 2 VIEW COMPARISON:  09/18/2023 FINDINGS: Frontal and lateral views of the chest demonstrate postsurgical changes from median sternotomy. The cardiac silhouette is enlarged but stable. There is background emphysema, with superimposed increased interstitial prominence and trace pleural effusion since prior study, consistent with mild fluid overload. No focal consolidation or pneumothorax. No acute bony abnormalities. IMPRESSION: 1. Mild interstitial edema superimposed upon background emphysema. 2. Trace bilateral pleural effusions. Electronically Signed   By: Ozell Daring M.D.   On: 01/21/2024 15:01   My independent interpretation of EKG: Sinus rhythm with possible LAE and slightly prolonged QTc of 478  Assessment/Plan Olis Viverette is a 73 y.o. male with medical history significant for chronic combined systolic and diastolic heart failure, chronic hypoxic respiratory failure on 4 L [place], obesity, T2DM, HLD, HTN, MI/CAD s/p CABG and stenting, lumbar radiculopathy, OSA on CPAP, COPD, anemia, venous insufficiency, chronic leg swelling, and a recent hospitalization for GI bleed at outside hospital who presented to [facility] ED for evaluation of shortness of breath and admitted for CHF exacerbation.  # Acute on chronic systolic and diastolic heart failure - Last TTE on *** shows EF *** - Pt presented with *** - Pt with *** signs of CHF exacerbation - Acute CHF likely 2/2 *** - Start IV lasix  *** - Continue *** - Follow up repeat *** - Strict I&O, daily weights - Maintain K+ > 4.0, Mag > 2.0 - Telemetry  # Normocytic anemia - -  # T2DM - - -  # GERD # Hx of duodenal ulcer - -  # HTN - BP stable with SBP in the 100-130s -  # Venous insufficiency # Chronic leg swelling   # COPD # Chronic hypoxic respiratory failure - -  # MI/CAD s/p CABG and stenting -  # HLD -  # OSA - Continue CPAP at night  # Class III obesity Body mass index is 41.43 kg/m. Filed Weights   01/21/24 1313  Weight: 116.4 kg   - F/u with PCP for weight lost and nutrition counseling   DVT prophylaxis: SCDs    Code Status: Full Code  Consults called: None  Family Communication: No family at bedside Severity of Illness: The appropriate patient status for this patient is OBSERVATION. Observation status is judged to be reasonable and necessary in order to provide the required intensity of service to ensure the patient's safety. The patient's presenting symptoms, physical exam findings, and initial radiographic and laboratory data in the context of their medical condition is felt to place them at decreased risk for further clinical deterioration. Furthermore, it is anticipated that the patient will be medically stable for  discharge from the hospital within 2 midnights of admission.   Level of care: Telemetry Cardiac    Lou Claretta HERO, MD 01/21/2024, 6:12 PM [facility] Pager: [phone] Isaiah 41:10   If 7PM-7AM, please contact night-coverage [email or web address] Password [email or web address]

## 2024-01-22 ENCOUNTER — Other Ambulatory Visit: Payer: Self-pay

## 2024-01-22 DIAGNOSIS — E66813 Obesity, class 3: Secondary | ICD-10-CM

## 2024-01-22 DIAGNOSIS — K2270 Barrett's esophagus without dysplasia: Secondary | ICD-10-CM | POA: Diagnosis present

## 2024-01-22 DIAGNOSIS — J9610 Chronic respiratory failure, unspecified whether with hypoxia or hypercapnia: Secondary | ICD-10-CM | POA: Diagnosis present

## 2024-01-22 DIAGNOSIS — I110 Hypertensive heart disease with heart failure: Secondary | ICD-10-CM | POA: Diagnosis present

## 2024-01-22 DIAGNOSIS — Z6841 Body Mass Index (BMI) 40.0 and over, adult: Secondary | ICD-10-CM | POA: Diagnosis not present

## 2024-01-22 DIAGNOSIS — J410 Simple chronic bronchitis: Secondary | ICD-10-CM

## 2024-01-22 DIAGNOSIS — N400 Enlarged prostate without lower urinary tract symptoms: Secondary | ICD-10-CM

## 2024-01-22 DIAGNOSIS — J9611 Chronic respiratory failure with hypoxia: Secondary | ICD-10-CM | POA: Insufficient documentation

## 2024-01-22 DIAGNOSIS — J4489 Other specified chronic obstructive pulmonary disease: Secondary | ICD-10-CM | POA: Diagnosis present

## 2024-01-22 DIAGNOSIS — Z951 Presence of aortocoronary bypass graft: Secondary | ICD-10-CM | POA: Diagnosis not present

## 2024-01-22 DIAGNOSIS — Z7982 Long term (current) use of aspirin: Secondary | ICD-10-CM | POA: Diagnosis not present

## 2024-01-22 DIAGNOSIS — I10 Essential (primary) hypertension: Secondary | ICD-10-CM | POA: Diagnosis not present

## 2024-01-22 DIAGNOSIS — I5033 Acute on chronic diastolic (congestive) heart failure: Secondary | ICD-10-CM

## 2024-01-22 DIAGNOSIS — I255 Ischemic cardiomyopathy: Secondary | ICD-10-CM | POA: Diagnosis present

## 2024-01-22 DIAGNOSIS — I2510 Atherosclerotic heart disease of native coronary artery without angina pectoris: Secondary | ICD-10-CM | POA: Diagnosis present

## 2024-01-22 DIAGNOSIS — I252 Old myocardial infarction: Secondary | ICD-10-CM | POA: Diagnosis not present

## 2024-01-22 DIAGNOSIS — I509 Heart failure, unspecified: Secondary | ICD-10-CM | POA: Diagnosis present

## 2024-01-22 DIAGNOSIS — I890 Lymphedema, not elsewhere classified: Secondary | ICD-10-CM | POA: Diagnosis present

## 2024-01-22 DIAGNOSIS — R609 Edema, unspecified: Secondary | ICD-10-CM | POA: Diagnosis not present

## 2024-01-22 DIAGNOSIS — I7143 Infrarenal abdominal aortic aneurysm, without rupture: Secondary | ICD-10-CM | POA: Diagnosis present

## 2024-01-22 DIAGNOSIS — D509 Iron deficiency anemia, unspecified: Secondary | ICD-10-CM | POA: Diagnosis present

## 2024-01-22 DIAGNOSIS — E785 Hyperlipidemia, unspecified: Secondary | ICD-10-CM | POA: Diagnosis present

## 2024-01-22 DIAGNOSIS — I872 Venous insufficiency (chronic) (peripheral): Secondary | ICD-10-CM | POA: Diagnosis present

## 2024-01-22 DIAGNOSIS — I959 Hypotension, unspecified: Secondary | ICD-10-CM | POA: Diagnosis present

## 2024-01-22 DIAGNOSIS — Z7951 Long term (current) use of inhaled steroids: Secondary | ICD-10-CM | POA: Diagnosis not present

## 2024-01-22 DIAGNOSIS — G4733 Obstructive sleep apnea (adult) (pediatric): Secondary | ICD-10-CM | POA: Diagnosis present

## 2024-01-22 DIAGNOSIS — D649 Anemia, unspecified: Secondary | ICD-10-CM | POA: Insufficient documentation

## 2024-01-22 DIAGNOSIS — Z79899 Other long term (current) drug therapy: Secondary | ICD-10-CM | POA: Diagnosis not present

## 2024-01-22 DIAGNOSIS — E119 Type 2 diabetes mellitus without complications: Secondary | ICD-10-CM | POA: Diagnosis present

## 2024-01-22 DIAGNOSIS — Z8719 Personal history of other diseases of the digestive system: Secondary | ICD-10-CM

## 2024-01-22 DIAGNOSIS — I5023 Acute on chronic systolic (congestive) heart failure: Secondary | ICD-10-CM | POA: Diagnosis present

## 2024-01-22 DIAGNOSIS — Z9981 Dependence on supplemental oxygen: Secondary | ICD-10-CM | POA: Diagnosis not present

## 2024-01-22 LAB — LACTIC ACID, PLASMA: Lactic Acid, Venous: 1.8 mmol/L (ref 0.5–1.9)

## 2024-01-22 LAB — BASIC METABOLIC PANEL WITH GFR
Anion gap: 8 (ref 5–15)
BUN: 15 mg/dL (ref 8–23)
CO2: 28 mmol/L (ref 22–32)
Calcium: 8.5 mg/dL — ABNORMAL LOW (ref 8.9–10.3)
Chloride: 104 mmol/L (ref 98–111)
Creatinine, Ser: 0.86 mg/dL (ref 0.61–1.24)
GFR, Estimated: >60 mL/min (ref >60)
Glucose, Bld: 96 mg/dL (ref 70–99)
Potassium: 3.5 mmol/L (ref 3.5–5.1)
Sodium: 140 mmol/L (ref 135–145)

## 2024-01-22 MED ORDER — FUROSEMIDE 10 MG/ML IJ SOLN
80.0000 mg | Freq: Two times a day (BID) | INTRAMUSCULAR | Status: DC
  Administered 2024-01-22 – 2024-01-23 (×2): 80 mg via INTRAVENOUS
  Filled 2024-01-22 (×2): qty 8

## 2024-01-22 MED ORDER — DAPAGLIFLOZIN PROPANEDIOL 10 MG PO TABS
10.0000 mg | ORAL_TABLET | Freq: Every day | ORAL | Status: DC
  Administered 2024-01-23 – 2024-01-26 (×4): 10 mg via ORAL
  Filled 2024-01-22 (×4): qty 1

## 2024-01-22 MED ORDER — LIDOCAINE 5 % EX PTCH
2.0000 | MEDICATED_PATCH | CUTANEOUS | Status: AC
  Administered 2024-01-22 – 2024-01-23 (×2): 2 via TRANSDERMAL
  Filled 2024-01-22 (×2): qty 2

## 2024-01-22 MED ORDER — POTASSIUM CHLORIDE CRYS ER 20 MEQ PO TBCR
40.0000 meq | EXTENDED_RELEASE_TABLET | Freq: Once | ORAL | Status: AC
  Administered 2024-01-22: 40 meq via ORAL
  Filled 2024-01-22: qty 2

## 2024-01-22 MED ORDER — LIDOCAINE 5 % EX PTCH: 2.0000 | MEDICATED_PATCH | CUTANEOUS | Status: DC

## 2024-01-22 NOTE — Progress Notes (Signed)
 Heart Failure Navigator Progress Note  Assessed for Heart & Vascular TOC clinic readiness.  Patient does not meet criteria due to Advanced Heart Failure Team was consulted. .   Navigator will sign off at this time.   Stephane Haddock, BSN, Scientist, clinical (histocompatibility and immunogenetics) Only

## 2024-01-22 NOTE — Assessment & Plan Note (Signed)
 No signs of urinary retention.

## 2024-01-22 NOTE — Consult Note (Addendum)
 Advanced Heart Failure Team Consult Note   Primary Physician: Keren Vicenta BRAVO, MD Cardiologist:  Lamar Fitch, MD  Reason for Consultation: A/C HFpEF  HPI:    Phillip Dunn is seen today for evaluation of A/C HFpEF at the request of Dr. Noralee.   Phillip Dunn is a 73 y.o. male with chronic combined systolic and diastolic HF, CAD s/p CABG x2 00' and multiple PCI, chronic hypoxic resp failure on 4L, HTN, HLD, obesity, OSA on CPAP, chronic leg swelling, and GIB.  Echo 9/24: EF 55-60%. RV normal, trivial MR, mild AV stenosis, AV mean gradient 12 mmHg.   Echo 6/25 [facility]): EF 45-50%.   Recently admitted to [facility] for GIB 9/25. Got 4 units of blood. Scope showed duodenal ulcers and barrett's esophagus. ASA stopped, remained on plavix .   Seen in cardiology clinic 01/16/24. Started on lasix ? (Though he reports taking 120 mg daily for years). BP was soft at this time.   Admitted with gradual weight gain (up 12lbs in 12 days), SOB, intt chest tightness and fatigue. 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 His wife has Alzheimer's and he helps take care of her. He watches what he eats (son and daughter help with nutrition) and reports full compliance with meds. Admission labs reviewed: K 3.9, Scr 0.85, Pro BNP 750, HsTrop 36>36, Hgb 7.9, CXR with mild interstitial edema and trace bilateral pleural effusions. Diuresis has been started but limited with hypotension. Primary team has ordered a PICC.   Resting comfortably in bed. Wife at bedside. Denies CP/SOB. Intt chest pressure.   Home Medications Prior to Admission medications   Medication Sig Start Date End Date Taking? Authorizing Provider  albuterol  (VENTOLIN  HFA) 108 (90 Base) MCG/ACT inhaler Inhale 2 puffs into the lungs every 4 (four) hours as needed for wheezing. 07/19/16  Yes [provider]  aspirin EC 81 MG tablet Take 81 mg by mouth  daily.   Yes [provider]  atorvastatin (LIPITOR) 80 MG tablet Take 80 mg by mouth daily.   Yes [provider]  clopidogrel  (PLAVIX ) 75 MG tablet TAKE 1 TABLET EVERY DAY 10/04/23  Yes Krasowski, Robert J, MD  Coenzyme Q10 (COQ-10) 10 MG CAPS Take 10 mg by mouth daily.   Yes [provider]  ferrous sulfate 325 (65 FE) MG EC tablet Take 325 mg by mouth 2 (two) times daily.   Yes [provider]  finasteride  (PROSCAR ) 5 MG tablet Take 1 tablet (5 mg total) by mouth daily. 03/12/23  Yes Krasowski, Robert J, MD  furosemide  (LASIX ) 40 MG tablet Take 1 tablet (40 mg total) by mouth daily. Patient taking differently: Take 120 mg by mouth daily. 01/16/24 04/15/24 Yes Krasowski, Robert J, MD  IPRATROPIUM BROMIDE IN Place 1 spray into both nostrils 2 (two) times daily.   Yes [provider]  KRILL OIL PO Take 1,000 mg by mouth daily.   Yes [provider]  losartan  (COZAAR ) 25 MG tablet Take 1 tablet (25 mg total) by mouth daily. 12/21/23 03/20/24 Yes Krasowski, Robert J, MD  MAGNESIUM-OXIDE 400 (240 Mg) MG tablet Take 1 tablet by mouth daily. 01/11/24  Yes [provider]  montelukast (SINGULAIR) 10 MG tablet Take 10 mg by mouth daily. 11/07/22  Yes [provider]  omeprazole (PRILOSEC) 40 MG capsule Take 40 mg by mouth 2 (two) times daily. 01/07/24  Yes [provider]  OVER THE COUNTER MEDICATION Take 1 tablet by mouth daily. Tylenol ES   Yes [provider]  SYMBICORT 160-4.5 MCG/ACT inhaler Inhale 2 puffs into the lungs 2 (two) times daily. 11/05/23  Yes [provider]  tamsulosin (FLOMAX) 0.4 MG CAPS capsule Take 0.4 mg by mouth daily.   Yes [provider]  zolpidem (AMBIEN) 10 MG tablet Take 10 mg by mouth at bedtime. 10/30/22  Yes [provider]    Past Medical History: Past Medical History:  Diagnosis Date   Abnormal endocrine laboratory test finding 08/02/2021   Adrenal disorder     Asthma    Benign hypertensive heart disease with CHF (congestive heart failure) (HCC)    Bradycardia    Carpal tunnel syndrome of right wrist 05/08/2019   Class 2 severe obesity with serious comorbidity and body mass index (BMI) of 39.0 to 39.9 in adult 08/08/2021   Coronary artery aneurysm 06/19/2017   Measuring 14 mm on CT from February 2019   Coronary artery disease    Coronary artery disease involving native coronary artery of native heart without angina pectoris 12/08/2014   Overview:  Multiple stents to circumflex artery in March 18, 2014  Formatting of this note might be different from the original. 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   Myocardial infarct (HCC)    OSA (obstructive sleep apnea)    Secondary hypotension    Sleep apnea 11/28/2016   Strain of thoracic back region 01/17/2023   Strain of thoracic region 01/17/2023   Trigger finger, right middle finger 10/14/2019   Venous insufficiency of left lower extremity     Past Surgical History: Past Surgical History:  Procedure Laterality Date   CARDIAC CATHETERIZATION     CARPAL TUNNEL RELEASE Left    CATARACT EXTRACTION, BILATERAL     CORONARY ANGIOPLASTY     CORONARY ARTERY BYPASS GRAFT     Right Hip replacement Right 06/2020    Family History: Family History  Problem Relation Age of Onset   Stroke Mother     Social History: Social History   Socioeconomic History   Marital status: Married    Spouse name: Not on file   Number of children: Not on file   Years of education: Not on file   Highest education level: Not on file  Occupational History   Not on file  Tobacco Use    Smoking status: Former    Types: Cigarettes   Smokeless tobacco: Never  Vaping Use   Vaping status: Former  Substance and Sexual Activity   Alcohol use: Yes    Alcohol/week: 2.0 - 3.0 standard drinks of alcohol    Types: 2 - 3 Glasses of wine per week    Comment: daily   Drug use: No   Sexual activity: Not on file  Other Topics Concern   Not on file  Social History Narrative   Not on file   Social Drivers of Health   Financial Resource Strain: Not on file  Food Insecurity: No Food Insecurity (01/21/2024)   Hunger Vital Sign    Worried About Running Out of Food in the Last Year:  Never true    Ran Out of Food in the Last Year: Never true  Transportation Needs: No Transportation Needs (01/21/2024)   PRAPARE - Administrator, Civil Service (Medical): No    Lack of Transportation (Non-Medical): No  Physical Activity: Not on file  Stress: Not on file  Social Connections: Moderately Isolated (01/21/2024)   Social Connection and Isolation Panel    Frequency of Communication with Friends and Family: More than three times a week    Frequency of Social Gatherings with Friends and Family: Once a week    Attends Religious Services: Never    Database administrator or Organizations: No    Attends Banker Meetings: Never    Marital Status: Married    Allergies:  Allergies  Allergen Reactions   Grass Pollen(K-O-R-T-Swt Vern) Other (See Comments)   Other Other (See Comments)    Pollen    Pollen Extract     Objective:    Vital Signs:   Temp:  [97.5 F (36.4 C)-98.2 F (36.8 C)] 97.7 F (36.5 C) (10/21 1138) Pulse Rate:  [57-75] 65 (10/21 1138) Resp:  [16-23] 17 (10/21 1138) BP: (83-136)/(54-83) 83/58 (10/21 1138) SpO2:  [96 %-100 %] 99 % (10/21 1138) FiO2 (%):  [4 %] 4 % (10/20 1924) Weight:  [116.9 kg] 116.9 kg (10/21 0342) Last BM Date :  (PTA)  Weight change: Filed Weights   01/21/24 1313 01/22/24 0342  Weight: 116.4 kg 116.9 kg     Intake/Output:   Intake/Output Summary (Last 24 hours) at 01/22/2024 1334 Last data filed at 01/22/2024 1300 Gross per 24 hour  Intake 840 ml  Output 1400 ml  Net -560 ml      Physical Exam    General:  elderly appearing.  No respiratory difficulty, + 4L [place] Neck: JVD ~11 cm.  Cor: Regular rate & rhythm.  Lungs: clear, diminished bases Extremities: +2 BLE edema to posterior thigh Neuro: alert & oriented x 3. Affect pleasant.   Telemetry   NSR 60s. Intt PVCs and short runs of NSVT (Personally reviewed)    EKG    NSR   Labs   Basic Metabolic Panel: Recent Labs  Lab 01/21/24 1316 01/21/24 1956 01/22/24 0223  NA 142  --  140  K 3.9  --  3.5  CL 104  --  104  CO2 26  --  28  GLUCOSE 102*  --  96  BUN 17  --  15  CREATININE 0.85  --  0.86  CALCIUM 9.5  --  8.5*  MG  --  1.9  --   PHOS  --  3.2  --     Liver Function Tests: No results for input(s): AST, ALT, ALKPHOS, BILITOT, PROT, ALBUMIN in the last 168 hours. No results for input(s): LIPASE, AMYLASE in the last 168 hours. No results for input(s): AMMONIA in the last 168 hours.  CBC: Recent Labs  Lab 01/21/24 1316  WBC 8.1  HGB 7.9*  HCT 27.4*  MCV 83.3  PLT 241    Cardiac Enzymes: No results for input(s): CKTOTAL, CKMB, CKMBINDEX, TROPONINI in the last 168 hours.  BNP: BNP (last 3 results) No results for input(s): BNP in the last 8760 hours.  ProBNP (last 3 results) Recent Labs    11/23/23 0926 01/21/24 1316  PROBNP 624* 750.0*     CBG: No results for input(s): GLUCAP in the last 168 hours.  Coagulation Studies: No results for input(s): LABPROT, INR  in the last 72 hours.   Imaging   US  EKG SITE RITE Result Date: 01/22/2024 If Site Rite image not attached, placement could not be confirmed due to current cardiac rhythm.  DG Chest 2 View Result Date: 01/21/2024 CLINICAL DATA:  Short of breath EXAM: CHEST - 2 VIEW COMPARISON:  09/18/2023  FINDINGS: Frontal and lateral views of the chest demonstrate postsurgical changes from median sternotomy. The cardiac silhouette is enlarged but stable. There is background emphysema, with superimposed increased interstitial prominence and trace pleural effusion since prior study, consistent with mild fluid overload. No focal consolidation or pneumothorax. No acute bony abnormalities. IMPRESSION: 1. Mild interstitial edema superimposed upon background emphysema. 2. Trace bilateral pleural effusions. Electronically Signed   By: Ozell Daring M.D.   On: 01/21/2024 15:01    Medications:   Current Medications:  aspirin EC  81 mg Oral Daily   atorvastatin  80 mg Oral Daily   clopidogrel   75 mg Oral Daily   ferrous sulfate  325 mg Oral BID   finasteride   5 mg Oral Daily   fluticasone furoate-vilanterol  1 puff Inhalation Daily   furosemide   80 mg Intravenous Q12H   losartan   25 mg Oral Daily   magnesium oxide  400 mg Oral Daily   montelukast  10 mg Oral Daily   pantoprazole  40 mg Oral Daily   tamsulosin  0.4 mg Oral Daily   zolpidem  10 mg Oral QHS    Infusions:   Patient Profile   Phillip Dunn is a 73 y.o. male with chronic combined systolic and diastolic HF, CAD s/p CABG, chronic hypoxic resp failure on 4L, HTN, HLD, obesity, OSA on CPAP, chronic leg swelling, and GIB. AHF team to see with A/C HFpEF and hypotension.   Assessment/Plan   A/C HFpEF - Echo 9/24: EF 55-60%. RV normal, trivial MR, mild AV stenosis, AV mean gradient 12 mmHg.  - Echo 6/25 [facility]): EF 45-50% - See last LHC in #2.  - NYHA IV on admission.  - Massively volume overloaded on exam. Exam difficult as he has hx chronic BLE edema.  - Place PICC and follow CVP and co-ox. May need inotrope support to help with diuresis. If he remains hypotensive may need to tx to ICU for NE.  - Continue lasix  80 IV BID for now. Suspect he may need metolazone or higher diuretic dose.  - Check lactic acid - Stop losartan  with  SBP in 80s and lightheadedness when standing to allow more BP room for diuresis.  - update echo.  - Place UNNA boots.   CAD s/p CABG and multiple PCI - Coronary CT 8/19: severe 3V CAD, occluded prox RCA, mid LAD/ patent LIMA to mid LAD, mid and distal LAD. LCx long stented area, unable to determine patency.  - [facility] 01/2019' multivessel CAD, acute OM1 occlusion, known CTO of RCA and SVG to PDA, known patent LIMA to LAD graft, mild LV dysfunction. Unsuccessful PCI of OM1 due to thrombus burden within the known OM1 aneurysmal segment. guidewire would not fully traverse the occluded segment which thrombosed after brief interruption of DAPT 5 years after prior OM intervention. Medically managed.  - HsTrop flat - Continue ASA/plavix , statin - Denies CP, reports chest pressure, worse when laying flat. Suspect 2/2 fluid.   HTN> hypotension - stop losartan  to allow BP room for diuresis  - Add back as able once diuresed.   Recent GIB - Scope 9/25 with duodenal ulcers and barretts esophagus.  -  Hgb last 7.9. Monitor for signs of bleeding. Currently denies.  - May need to stop ASA  Venous insufficiency Chronic leg swelling - Place UNNA boots   COPD Chronic hypoxic respiratory failure - on home 4L  OSA on CPAP - Continue CPAP   Length of Stay: 0  Beckey LITTIE Coe, NP  01/22/2024, 1:34 PM  Advanced Heart Failure Team Pager [phone] (M-F; 7a - 5p)  Please contact [facility] for night-coverage after hours (4p -7a ) and weekends on [email or web address]   Patient seen with NP, I formulated the plan and agree with the above note.   He was admitted with dyspnea and swelling.  History as noted above, recent admission to [facility] with GI bleeding duodenal ulcer found on EGD.  Hgb 7.9 here, denies melena/BRBPR.    No chest pain, TnT 36 => 36.  BP soft in 90s generally.  Last echo from 6/25 at [facility] with EF 45-50%, basal to mid inferior and inferolateral AK, mild RV  dilation/normal function, PASP 57 mmHg.   General: NAD Neck: JVP 14-16 cm, no thyromegaly or thyroid  nodule.  Lungs: Clear to auscultation bilaterally with normal respiratory effort. CV: Nondisplaced PMI.  Heart regular S1/S2, no S3/S4, 2/6 early SEM RUSB.  1+ edema to knees.  No carotid bruit.  Normal pedal pulses.  Abdomen: Soft, nontender, no hepatosplenomegaly, no distention.  Skin: Intact without lesions or rashes.  Neurologic: Alert and oriented x 3.  Psych: Normal affect. Extremities: No clubbing or cyanosis.  HEENT: Normal.   HF with mid range EF, suspect component of ischemic cardiomyopathy with inferior/inferolateral WMAs on 6/25 echo.   He is significantly volume overloaded on exam with prominent JVD and peripheral edema.  - Lasix  80 mg IV bid and follow response.  - Place PICC to follow CVP . - Farxiga 10 mg daily.  - Hold losartan  with low BP (SBP in 90s).  GDMT will be difficult to start.   Chronic CAD, doubt ACS with minimally elevated troponin/no trend (demand ischemia from CHF). Continue ASA, Plavix , and statin.   Hgb 7.9, recent GI bleed.  Denies overt bleeding.  - Check Fe studies.  - Transfuse hgb < 7.   Ezra Shuck 01/22/2024 4:53 PM

## 2024-01-22 NOTE — Assessment & Plan Note (Signed)
 Chronic lymphedema

## 2024-01-22 NOTE — Assessment & Plan Note (Signed)
 No sings of acute exacerbation Continue bronchodilator therapy and oxymetry monitoring

## 2024-01-22 NOTE — Progress Notes (Signed)
 Orthopedic Tech Progress Note Patient Details:  Dagen Beevers 16-Sep-1950 [id number]  Ortho Devices Type of Ortho Device: Ace wrap, Unna boot Ortho Device/Splint Location: BLE Ortho Device/Splint Interventions: Ordered, Adjustment, Application   Post Interventions Patient Tolerated: Well Instructions Provided: Care of device  Delanna LITTIE Pac 01/22/2024, 6:44 PM

## 2024-01-22 NOTE — Assessment & Plan Note (Signed)
 High sensitive troponin elevation due to heart failure, no acute coronary syndrome.

## 2024-01-22 NOTE — Hospital Course (Signed)
 Mr. Phillip Dunn was admitted to the hospital with the working diagnosis of heart failure exacerbation.   74 yo male with the past medical history of heart failure, T2DM, hypertension, coronary artery disease, obesity and COPD, who presented with dyspnea.  Recent hospitalization 9/26 to 01/04/24 for acute upper GI bleed with acute blood loss anemia. During his hospitalization he had  5 units PRBC and aggressive diuresis.  At home he had progressive and worsening dyspnea, 12 lbs weight gain along with lower extremity edema.  Because persistent symptoms he presented to the Ed.  On his initial physical examination his blood pressure was 136/83, HR 61, RR 18 and 02 saturation 100% Lungs with no wheezing, positive bilateral rales, heart with S1 and S2 present and regular with no gallops or rubs, abdomen protuberant but not distended, positive lower extremity edema +++  Na 140, K 3.9 Cl 104 bicarbonate 26 glucose 102 bun 17 cr 0,85  BNP 750  High sensitive troponin 36 and 36  Wbc 8.1 hgb 7.9 plt 241   Chest radiograph with cardiomegaly, bilateral hilar vascular congestion with diffuse central interstitial infiltrates, with bilateral pleural effusions.   EKG 73 bpm, normal axis, normal intervals, qtc 478, sinus rhythm with no significant ST segment or T wave changes,

## 2024-01-22 NOTE — Assessment & Plan Note (Addendum)
 09/29 echocardiogram with reduced LV systolic function EF 30 to 35%, mild dilatated LV, mild LVH, akinesis anterolateral and inferolateral segments. RV systolic function preserved, LA with mild dilatation. No significant valvular disease  Patient continue volume overloaded No significant urine output and blood pressure systolic dropping to 80 mmHg range Continue symptomatic Concerned for low output state   Plan to increase furosemide  to 80 mg IV  Bid Will get PCC line and measure SV02 Consult advance heart failure patient may need inotropic support.   Acute hypoxemic respiratory failure due to acute cardiogenic pulmonary edema Continue diuresis and supplemental 02 per [place]  Today 02 saturation 95% on 4 L/min per [place]

## 2024-01-22 NOTE — Progress Notes (Signed)
 Arrived at bedside to discuss PICC with patient and place.  Patient with CPAP on and room darkened.  Roused patient to discuss the procedure and need.  Patient politely declined to have PICC placed tonight stating that I was just about asleep. I have taken my sleeping pill.  People in the hospital just don't get enough sleep. Chal[place], RN made aware.  PICC team will follow up tomorrow.

## 2024-01-22 NOTE — Assessment & Plan Note (Signed)
 Chronic anemia  Check cell count in am Continue pantoprazole

## 2024-01-22 NOTE — Assessment & Plan Note (Signed)
 Cpap

## 2024-01-22 NOTE — Progress Notes (Signed)
 Patient had 6 beat run of svt. Patient is reporting some chest tightness that started before the svt. His pain level is 2 out of 10. Patient refused anything for pain. Notified MD

## 2024-01-22 NOTE — H&P (Incomplete)
 History and Physical  Phillip Dunn FMW:[id number] DOB: 02/18/1951 DOA: 01/21/2024  PCP: Keren Vicenta BRAVO, MD   Chief Complaint: Shortness of breath  HPI: Phillip Dunn is a 73 y.o. male with medical history significant for chronic combined systolic and diastolic heart failure, chronic hypoxic respiratory failure on 4 L [place], obesity, T2DM, HLD, HTN, MI/CAD s/p CABG and stenting, lumbar radiculopathy, OSA on CPAP, COPD, anemia, venous insufficiency, chronic leg swelling, and a recent hospitalization for GI bleed at outside hospital who presented to [facility] ED for evaluation of shortness of breath.  [facility] ED Course: Initial vitals show patient afebrile, normotensive, SpO2 100% on 4 L [place]. Initial labs significant for Hgb 7.9, normal renal function, proBNP 750, troponin 36-36, mag 1.9, Phos 3.2. CXR shows mild interstitial edema and trace bilateral pleural effusions. Pt received Lasix  40 mg x 1.  Patient was admitted to [facility] service and transferred to [facility].  Review of Systems: Please see HPI for pertinent positives and negatives. A complete 10 system review of systems are otherwise negative.  Past Medical History:  Diagnosis Date  . Abnormal endocrine laboratory test finding 08/02/2021  . Adrenal disorder   . Asthma   . Benign hypertensive heart disease with CHF (congestive heart failure) (HCC)   . Bradycardia   . Carpal tunnel syndrome of right wrist 05/08/2019  . Class 2 severe obesity with serious comorbidity and body mass index (BMI) of 39.0 to 39.9 in adult 08/08/2021  . 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 Coronary artery disease   . Coronary artery disease involving native coronary artery of native heart without angina pectoris 12/08/2014   Overview:  Multiple stents to circumflex artery in March 18, 2014  Formatting of this note might be different from the original. Multiple stents to circumflex artery in March 18, 2014  .  Diabetes mellitus without complication (HCC)   . Dyslipidemia 12/08/2014  . Elevated cortisol level 08/02/2021  . Ganglion cyst of dorsum of right wrist 07/07/2019  . Hepatitis   . Hyperlipidemia   . Left wrist pain 05/07/2019  . Low back pain 01/17/2023  . Low serum adrenocorticotropic hormone (ACTH ) 08/08/2021  . Myocardial infarct (HCC)   . OSA (obstructive sleep apnea)   . Secondary hypotension   . Sleep apnea 11/28/2016  . Strain of thoracic back region 01/17/2023  . Strain of thoracic region 01/17/2023  . Trigger finger, right middle finger 10/14/2019  . Venous insufficiency of left lower extremity    Past Surgical History:  Procedure Laterality Date  . CARDIAC CATHETERIZATION    . CARPAL TUNNEL RELEASE Left   . CATARACT EXTRACTION, BILATERAL    . CORONARY ANGIOPLASTY    . CORONARY ARTERY BYPASS GRAFT    . Right Hip replacement Right 06/2020   Social History:  reports that he has quit smoking. His smoking use included cigarettes. He has never used smokeless tobacco. He reports current alcohol use of about 2.0 - 3.0 standard drinks of alcohol per week. He reports that he does not use drugs.  Allergies  Allergen Reactions  . Grass Pollen(K-O-R-T-Swt Vern) Other (See Comments)  . Other Other (See Comments)    Pollen   . Pollen Extract     Family History  Problem Relation Age of Onset  . Stroke Mother      Prior to Admission medications   Medication Sig Start Date End Date Taking? Authorizing Provider  albuterol  (VENTOLIN  HFA) 108 (90 Base)  MCG/ACT inhaler Inhale 2 puffs into the lungs every 4 (four) hours as needed for wheezing. 07/19/16  Yes [provider]  aspirin EC 81 MG tablet Take 81 mg by mouth daily.   Yes [provider]  atorvastatin (LIPITOR) 80 MG tablet Take 80 mg by mouth daily.   Yes [provider]  clopidogrel  (PLAVIX ) 75 MG tablet TAKE 1 TABLET EVERY DAY 10/04/23  Yes Krasowski, Robert J, MD  Coenzyme Q10 (COQ-10) 10 MG  CAPS Take 10 mg by mouth daily.   Yes [provider]  ferrous sulfate 325 (65 FE) MG EC tablet Take 325 mg by mouth 2 (two) times daily.   Yes [provider]  finasteride  (PROSCAR ) 5 MG tablet Take 1 tablet (5 mg total) by mouth daily. 03/12/23  Yes Krasowski, Robert J, MD  furosemide  (LASIX ) 40 MG tablet Take 1 tablet (40 mg total) by mouth daily. Patient taking differently: Take 120 mg by mouth daily. 01/16/24 04/15/24 Yes Krasowski, Robert J, MD  IPRATROPIUM BROMIDE IN Place 1 spray into both nostrils 2 (two) times daily.   Yes [provider]  KRILL OIL PO Take 1,000 mg by mouth daily.   Yes [provider]  losartan  (COZAAR ) 25 MG tablet Take 1 tablet (25 mg total) by mouth daily. 12/21/23 03/20/24 Yes Krasowski, Robert J, MD  MAGNESIUM-OXIDE 400 (240 Mg) MG tablet Take 1 tablet by mouth daily. 01/11/24  Yes [provider]  montelukast (SINGULAIR) 10 MG tablet Take 10 mg by mouth daily. 11/07/22  Yes [provider]  omeprazole (PRILOSEC) 40 MG capsule Take 40 mg by mouth 2 (two) times daily. 01/07/24  Yes [provider]  OVER THE COUNTER MEDICATION Take 1 tablet by mouth daily. Tylenol ES   Yes [provider]  SYMBICORT 160-4.5 MCG/ACT inhaler Inhale 2 puffs into the lungs 2 (two) times daily. 11/05/23  Yes [provider]  tamsulosin (FLOMAX) 0.4 MG CAPS capsule Take 0.4 mg by mouth daily.   Yes [provider]  zolpidem (AMBIEN) 10 MG tablet Take 10 mg by mouth at bedtime. 10/30/22  Yes [provider]    Physical Exam: BP 136/83   Pulse 61   Temp 98.4 F (36.9 C)   Resp 18   Wt 116.4 kg   SpO2 100%   BMI 41.43 kg/m  General: Pleasant, well-appearing *** laying in bed. No acute distress. HEENT: [place]/AT. Anicteric sclera CV: RRR. No murmurs, rubs, or gallops. No LE edema Pulmonary: Lungs CTAB. Normal effort. No wheezing or rales. Abdominal: Soft, nontender, nondistended. Normal bowel  sounds. Extremities: Palpable radial and DP pulses. Normal ROM. Skin: Warm and dry. No obvious rash or lesions. Neuro: A&Ox3. Moves all extremities. Normal sensation to light touch. No focal deficit. Psych: Normal mood and affect          Labs on Admission:  Basic Metabolic Panel: Recent Labs  Lab 01/21/24 1316  NA 142  K 3.9  CL 104  CO2 26  GLUCOSE 102*  BUN 17  CREATININE 0.85  CALCIUM 9.5   Liver Function Tests: No results for input(s): AST, ALT, ALKPHOS, BILITOT, PROT, ALBUMIN in the last 168 hours. No results for input(s): LIPASE, AMYLASE in the last 168 hours. No results for input(s): AMMONIA in the last 168 hours. CBC: Recent Labs  Lab 01/21/24 1316  WBC 8.1  HGB 7.9*  HCT 27.4*  MCV 83.3  PLT 241   Cardiac Enzymes: No results for input(s): CKTOTAL, CKMB, CKMBINDEX,  TROPONINI in the last 168 hours. BNP (last 3 results) No results for input(s): BNP in the last 8760 hours.  ProBNP (last 3 results) Recent Labs    11/23/23 0926 01/21/24 1316  PROBNP 624* 750.0*    CBG: No results for input(s): GLUCAP in the last 168 hours.  Radiological Exams on Admission: DG Chest 2 View Result Date: 01/21/2024 CLINICAL DATA:  Short of breath EXAM: CHEST - 2 VIEW COMPARISON:  09/18/2023 FINDINGS: Frontal and lateral views of the chest demonstrate postsurgical changes from median sternotomy. The cardiac silhouette is enlarged but stable. There is background emphysema, with superimposed increased interstitial prominence and trace pleural effusion since prior study, consistent with mild fluid overload. No focal consolidation or pneumothorax. No acute bony abnormalities. IMPRESSION: 1. Mild interstitial edema superimposed upon background emphysema. 2. Trace bilateral pleural effusions. Electronically Signed   By: Ozell Daring M.D.   On: 01/21/2024 15:01   My independent interpretation of EKG: Sinus rhythm with possible LAE and slightly  prolonged QTc of 478  Assessment/Plan Phillip Dunn is a 73 y.o. male with medical history significant for chronic combined systolic and diastolic heart failure, chronic hypoxic respiratory failure on 4 L [place], obesity, T2DM, HLD, HTN, MI/CAD s/p CABG and stenting, lumbar radiculopathy, OSA on CPAP, COPD, anemia, venous insufficiency, chronic leg swelling, and a recent hospitalization for GI bleed at outside hospital who presented to [facility] ED for evaluation of shortness of breath and admitted for CHF exacerbation.  # Acute on chronic systolic and diastolic heart failure - Last TTE on *** shows EF *** - Pt presented with *** - Pt with *** signs of CHF exacerbation - Acute CHF likely 2/2 *** - Start IV lasix  *** - Continue *** - Follow up repeat *** - Strict I&O, daily weights - Maintain K+ > 4.0, Mag > 2.0 - Telemetry  # Normocytic anemia - -  # T2DM - - -  # GERD # Hx of duodenal ulcer - -  # HTN - BP stable with SBP in the 100-130s -  # Venous insufficiency # Chronic leg swelling   # COPD # Chronic hypoxic respiratory failure - -  # MI/CAD s/p CABG and stenting -  # HLD -  # OSA - Continue CPAP at night  # Class III obesity Body mass index is 41.43 kg/m. Filed Weights   01/21/24 1313  Weight: 116.4 kg   - F/u with PCP for weight lost and nutrition counseling   DVT prophylaxis: SCDs    Code Status: Full Code  Consults called: None  Family Communication: No family at bedside Severity of Illness: The appropriate patient status for this patient is OBSERVATION. Observation status is judged to be reasonable and necessary in order to provide the required intensity of service to ensure the patient's safety. The patient's presenting symptoms, physical exam findings, and initial radiographic and laboratory data in the context of their medical condition is felt to place them at decreased risk for further clinical deterioration. Furthermore, it is  anticipated that the patient will be medically stable for discharge from the hospital within 2 midnights of admission.   Level of care: Telemetry Cardiac    Lou Claretta HERO, MD 01/21/2024, 6:12 PM [facility] Pager: [phone] Isaiah 41:10   If 7PM-7AM, please contact night-coverage [email or web address] Password [email or web address]

## 2024-01-22 NOTE — Progress Notes (Incomplete)
 PROGRESS NOTE    Phillip Dunn  FMW:[id number] DOB: Dec 12, 1950 DOA: 01/21/2024 PCP: Keren Vicenta BRAVO, MD  72/M w chronic combined CHF, chronic hypoxic respiratory failure on 4 L [place], obesity, T2DM, HLD, HTN, MI/CAD s/p CABG and stenting, lumbar radiculopathy, OSA on CPAP, COPD, anemia, venous insufficiency, chronic leg swelling, and a recent hospitalization for GI bleed at outside hospital who presented to [facility] ED for evaluation of shortness of breath. Patient reports he was discharged from the hospital about 3 weeks ago and since then, he has had a gradual weight gain of up to 12 pounds of fluid. He has had progressive shortness of breath with intermittent chest tightness. [facility] ED Course:VSS, labs significant for Hgb 7.9, normal renal function, proBNP 750, troponin 36-36, mag 1.9, Phos 3.2. CXR shows mild interstitial edema and trace bilateral pleural effusions   Subjective:   Assessment and Plan:  Acute on chronic systolic and diastolic heart failure - Last TTE on 09/18/2023 shows EF 45-50%, pseudo normal LV diastolic function and mild RA dilation - Start IV lasix  60 mg twice daily - Continue losartan  - Strict I&O, daily weights - Maintain K+ > 4.0, Mag > 2.0 - Telemetry   Normocytic anemia Hx of duodenal ulcer - recently admitted to [facility] on 9/26 for GI bleed and acute CHF and discharged on 10/3 - An EGD on 9/28 revealed a Barrett's esophagus and duodenal ulcer with oozing hemorrhage treated with 2 clips - Patient received 4 units of blood with improvement in Hgb from 6.1 on admission to 8.1 at discharge - Hemoglobin remains relatively stable at 7.9 on admission - Continue Protonix and iron supplementation - Trend CBC and transfuse for hgb > 7 - Follow-up with [facility] at discharge   T2DM - Glucose stable at 108 on admission - Not on any antidiabetes medications - Continue heart healthy diet   Venous insufficiency -Chronic leg  swelling - IV diuresing as above   COPD/Chronic hypoxic respiratory failure - Remains on home 4 L , no wheezing or cough - Continue Singulair and Breo Ellipta - As needed DuoNeb   CAD s/p CABG and stenting - Continue Plavix  and aspirin   HLD - Continue atorvastatin   OSA - Continue CPAP at night   BPH - Continue Flomax and finasteride    # Class III obesity Body mass index is 41.43 kg/m.     DVT prophylaxis: SCDs Code Status: full Family Communication: Disposition Plan:   Consultants:    Procedures:   Antimicrobials:    Objective: Vitals:   01/22/24 0342 01/22/24 0810 01/22/24 0813 01/22/24 1138  BP: 108/64 (!) 93/54  (!) 83/58  Pulse: 65 68 72 65  Resp: 17 (!) 21 19 17   Temp: 97.7 F (36.5 C) (!) 97.5 F (36.4 C)  97.7 F (36.5 C)  TempSrc: Oral Oral  Oral  SpO2: 96% 97% 100% 99%  Weight: 116.9 kg     Height:        Intake/Output Summary (Last 24 hours) at 01/22/2024 1444 Last data filed at 01/22/2024 1300 Gross per 24 hour  Intake 960 ml  Output 1400 ml  Net -440 ml   Filed Weights   01/21/24 1313 01/22/24 0342  Weight: 116.4 kg 116.9 kg    Examination:     Data Reviewed:   CBC: Recent Labs  Lab 01/21/24 1316  WBC 8.1  HGB 7.9*  HCT 27.4*  MCV 83.3  PLT 241   Basic Metabolic Panel: Recent  Labs  Lab 01/21/24 1316 01/21/24 1956 01/22/24 0223  NA 142  --  140  K 3.9  --  3.5  CL 104  --  104  CO2 26  --  28  GLUCOSE 102*  --  96  BUN 17  --  15  CREATININE 0.85  --  0.86  CALCIUM 9.5  --  8.5*  MG  --  1.9  --   PHOS  --  3.2  --    GFR: Estimated Creatinine Clearance: 92 mL/min (by C-G formula based on SCr of 0.86 mg/dL). Liver Function Tests: No results for input(s): AST, ALT, ALKPHOS, BILITOT, PROT, ALBUMIN in the last 168 hours. No results for input(s): LIPASE, AMYLASE in the last 168 hours. No results for input(s): AMMONIA in the last 168 hours. Coagulation Profile: No results for  input(s): INR, PROTIME in the last 168 hours. Cardiac Enzymes: No results for input(s): CKTOTAL, CKMB, CKMBINDEX, TROPONINI in the last 168 hours. BNP (last 3 results) Recent Labs    11/23/23 0926 01/21/24 1316  PROBNP 624* 750.0*   HbA1C: No results for input(s): HGBA1C in the last 72 hours. CBG: No results for input(s): GLUCAP in the last 168 hours. Lipid Profile: No results for input(s): CHOL, HDL, LDLCALC, TRIG, CHOLHDL, LDLDIRECT in the last 72 hours. Thyroid  Function Tests: No results for input(s): TSH, T4TOTAL, FREET4, T3FREE, THYROIDAB in the last 72 hours. Anemia Panel: No results for input(s): VITAMINB12, FOLATE, FERRITIN, TIBC, IRON, RETICCTPCT in the last 72 hours. Urine analysis: No results found for: COLORURINE, APPEARANCEUR, LABSPEC, PHURINE, GLUCOSEU, HGBUR, BILIRUBINUR, KETONESUR, PROTEINUR, UROBILINOGEN, NITRITE, LEUKOCYTESUR Sepsis Labs: @LABRCNTIP (procalcitonin:4,lacticidven:4)  )No results found for this or any previous visit (from the past 240 hours).   Radiology Studies: US  EKG SITE RITE Result Date: 01/22/2024 If Site Rite image not attached, placement could not be confirmed due to current cardiac rhythm.  DG Chest 2 View Result Date: 01/21/2024 CLINICAL DATA:  Short of breath EXAM: CHEST - 2 VIEW COMPARISON:  09/18/2023 FINDINGS: Frontal and lateral views of the chest demonstrate postsurgical changes from median sternotomy. The cardiac silhouette is enlarged but stable. There is background emphysema, with superimposed increased interstitial prominence and trace pleural effusion since prior study, consistent with mild fluid overload. No focal consolidation or pneumothorax. No acute bony abnormalities. IMPRESSION: 1. Mild interstitial edema superimposed upon background emphysema. 2. Trace bilateral pleural effusions. Electronically Signed   By: Ozell Daring M.D.   On: 01/21/2024 15:01      Scheduled Meds:  aspirin EC  81 mg Oral Daily   atorvastatin  80 mg Oral Daily   clopidogrel   75 mg Oral Daily   ferrous sulfate  325 mg Oral BID   finasteride   5 mg Oral Daily   fluticasone furoate-vilanterol  1 puff Inhalation Daily   furosemide   80 mg Intravenous Q12H   magnesium oxide  400 mg Oral Daily   montelukast  10 mg Oral Daily   pantoprazole  40 mg Oral Daily   tamsulosin  0.4 mg Oral Daily   zolpidem  10 mg Oral QHS   Continuous Infusions:   LOS: 0 days    Time spent:    Sigurd Pac, MD [facility]   01/22/2024, 2:44 PM

## 2024-01-22 NOTE — Assessment & Plan Note (Signed)
 Hold on losartan  due to risk of hypotension

## 2024-01-22 NOTE — Assessment & Plan Note (Signed)
Calculated BMI 41.6  

## 2024-01-22 NOTE — Progress Notes (Signed)
 Progress Note   Patient: Phillip Dunn FMW:[id number] DOB: July 16, 1950 DOA: 01/21/2024     0 DOS: the patient was seen and examined on 01/22/2024   Brief hospital course: Phillip Dunn was admitted to the hospital with the working diagnosis of heart failure exacerbation.   73 yo male with the past medical history of heart failure, T2DM, hypertension, coronary artery disease, obesity and COPD, who presented with dyspnea.  Recent hospitalization 9/26 to 01/04/24 for acute upper GI bleed with acute blood loss anemia. During his hospitalization he had  5 units PRBC and aggressive diuresis.  At home he had progressive and worsening dyspnea, 12 lbs weight gain along with lower extremity edema.  Because persistent symptoms he presented to the Ed.  On his initial physical examination his blood pressure was 136/83, HR 61, RR 18 and 02 saturation 100% Lungs with no wheezing, positive bilateral rales, heart with S1 and S2 present and regular with no gallops or rubs, abdomen protuberant but not distended, positive lower extremity edema +++  Na 140, K 3.9 Cl 104 bicarbonate 26 glucose 102 bun 17 cr 0,85  BNP 750  High sensitive troponin 36 and 36  Wbc 8.1 hgb 7.9 plt 241   Chest radiograph with cardiomegaly, bilateral hilar vascular congestion with diffuse central interstitial infiltrates, with bilateral pleural effusions.   EKG 73 bpm, normal axis, normal intervals, qtc 478, sinus rhythm with no significant ST segment or T wave changes,    Assessment and Plan: * Acute on chronic systolic CHF (congestive heart failure) (HCC) 09/29 echocardiogram with reduced LV systolic function EF 30 to 35%, mild dilatated LV, mild LVH, akinesis anterolateral and inferolateral segments. RV systolic function preserved, LA with mild dilatation. No significant valvular disease  Patient continue volume overloaded No significant urine output and blood pressure systolic dropping to 80 mmHg range Continue  symptomatic Concerned for low output state   Plan to increase furosemide  to 80 mg IV  Bid Will get PCC line and measure SV02 Consult advance heart failure patient may need inotropic support.   Acute hypoxemic respiratory failure due to acute cardiogenic pulmonary edema Continue diuresis and supplemental 02 per [name]  Today 02 saturation 95% on 4 L/min per [name]   Essential hypertension Hold on losartan  due to risk of hypotension   CAD S/P percutaneous coronary angioplasty High sensitive troponin elevation due to heart failure, no acute coronary syndrome   Chronic obstructive pulmonary disease (HCC) No sings of acute exacerbation Continue bronchodilator therapy and oxymetry monitoring   History of duodenal ulcer Chronic anemia  Check cell count in am Continue pantoprazole  Benign prostatic hyperplasia No signs of urinary retention   OSA on CPAP C pap   Chronic venous insufficiency Chronic lymphedema   Obesity, Class III, BMI 40-49.9 (morbid obesity) (HCC) Calculated BMI 41.6       Subjective: Patient continue to have dyspnea and lower extremity edema, positive fatigue and generalized weakness   Physical Exam: Vitals:   01/22/24 0342 01/22/24 0810 01/22/24 0813 01/22/24 1138  BP: 108/64 (!) 93/54  (!) 83/58  Pulse: 65 68 72 65  Resp: 17 (!) 21 19 17   Temp: 97.7 F (36.5 C) (!) 97.5 F (36.4 C)  97.7 F (36.5 C)  TempSrc: Oral Oral  Oral  SpO2: 96% 97% 100% 99%  Weight: 116.9 kg     Height:       Neurology awake and alert ENT with mild pallor Cardiovascular with S1 and S2 present and regular with no gallops  rubs or murmurs Positive JVD Respiratory with bilateral rales with no rhonchi or wheezing Abdomen with no distention  Positive lower extremity edema +++  Data Reviewed:    Family Communication: I spoke with patient's wife at the bedside, we talked in detail about patient's condition, plan of care and prognosis and all questions were  addressed.   Disposition: Status is: Inpatient Remains inpatient appropriate because: IV diuresis   Planned Discharge Destination: Home     Author: Elidia Toribio Furnace, MD 01/22/2024 12:21 PM  For on call review [email or web address].

## 2024-01-22 NOTE — Care Management Obs Status (Signed)
 MEDICARE OBSERVATION STATUS NOTIFICATION   Patient Details  Name: Phillip Dunn MRN: [id number] Date of Birth: March 11, 1951   Medicare Observation Status Notification Given:  Yes    [name] 01/22/2024, 8:53 AM

## 2024-01-23 ENCOUNTER — Telehealth (HOSPITAL_COMMUNITY): Payer: Self-pay | Admitting: Pharmacy Technician

## 2024-01-23 ENCOUNTER — Telehealth (HOSPITAL_COMMUNITY): Payer: Self-pay

## 2024-01-23 ENCOUNTER — Inpatient Hospital Stay (HOSPITAL_COMMUNITY)

## 2024-01-23 ENCOUNTER — Other Ambulatory Visit (HOSPITAL_COMMUNITY): Payer: Self-pay

## 2024-01-23 DIAGNOSIS — R609 Edema, unspecified: Secondary | ICD-10-CM | POA: Diagnosis not present

## 2024-01-23 DIAGNOSIS — I5023 Acute on chronic systolic (congestive) heart failure: Secondary | ICD-10-CM | POA: Diagnosis not present

## 2024-01-23 LAB — ECHOCARDIOGRAM COMPLETE
AR max vel: 1.75 cm2
AV Area VTI: 1.75 cm2
AV Area mean vel: 1.69 cm2
AV Mean grad: 9 mmHg
AV Peak grad: 17.4 mmHg
Ao pk vel: 2.08 m/s
Area-P 1/2: 4.94 cm2
Calc EF: 50.5 %
Height: 66 in
S' Lateral: 4 cm
Single Plane A2C EF: 36.4 %
Single Plane A4C EF: 59 %
Weight: 3777.6 [oz_av]

## 2024-01-23 LAB — RETICULOCYTES
Immature Retic Fract: 14.7 % (ref 2.3–15.9)
RBC.: 3.25 MIL/uL — ABNORMAL LOW (ref 4.22–5.81)
Retic Count, Absolute: 131.5 K/uL (ref 19.0–186.0)
Retic Ct Pct: 4.1 % — ABNORMAL HIGH (ref 0.4–3.1)

## 2024-01-23 LAB — FOLATE: Folate: >20 ng/mL (ref >5.9)

## 2024-01-23 LAB — CBC
HCT: 27.2 % — ABNORMAL LOW (ref 39.0–52.0)
Hemoglobin: 7.8 g/dL — ABNORMAL LOW (ref 13.0–17.0)
MCH: 23.9 pg — ABNORMAL LOW (ref 26.0–34.0)
MCHC: 28.7 g/dL — ABNORMAL LOW (ref 30.0–36.0)
MCV: 83.4 fL (ref 80.0–100.0)
Platelets: 213 K/uL (ref 150–400)
RBC: 3.26 MIL/uL — ABNORMAL LOW (ref 4.22–5.81)
RDW: 22.5 % — ABNORMAL HIGH (ref 11.5–15.5)
WBC: 7 K/uL (ref 4.0–10.5)
nRBC: 0 % (ref 0.0–0.2)

## 2024-01-23 LAB — BASIC METABOLIC PANEL WITH GFR
Anion gap: 9 (ref 5–15)
BUN: 15 mg/dL (ref 8–23)
CO2: 26 mmol/L (ref 22–32)
Calcium: 8.9 mg/dL (ref 8.9–10.3)
Chloride: 105 mmol/L (ref 98–111)
Creatinine, Ser: 0.9 mg/dL (ref 0.61–1.24)
GFR, Estimated: >60 mL/min (ref >60)
Glucose, Bld: 111 mg/dL — ABNORMAL HIGH (ref 70–99)
Potassium: 3.9 mmol/L (ref 3.5–5.1)
Sodium: 140 mmol/L (ref 135–145)

## 2024-01-23 LAB — MISC LABCORP TEST (SEND OUT): Labcorp test code: 140150

## 2024-01-23 LAB — IRON AND TIBC
Iron: 51 ug/dL (ref 45–182)
Saturation Ratios: 11 % — ABNORMAL LOW (ref 17.9–39.5)
TIBC: 482 ug/dL — ABNORMAL HIGH (ref 250–450)
UIBC: 431 ug/dL

## 2024-01-23 LAB — MAGNESIUM: Magnesium: 2 mg/dL (ref 1.7–2.4)

## 2024-01-23 LAB — HEPARIN LEVEL (UNFRACTIONATED): Heparin Unfractionated: <0.1 [IU]/mL — ABNORMAL LOW (ref 0.30–0.70)

## 2024-01-23 LAB — FERRITIN: Ferritin: 10 ng/mL — ABNORMAL LOW (ref 24–336)

## 2024-01-23 LAB — VITAMIN B12: Vitamin B-12: 806 pg/mL (ref 180–914)

## 2024-01-23 MED ORDER — IRON SUCROSE 500 MG IVPB - SIMPLE MED
500.0000 mg | Freq: Once | INTRAVENOUS | Status: DC
  Filled 2024-01-23: qty 275

## 2024-01-23 MED ORDER — FUROSEMIDE 10 MG/ML IJ SOLN
120.0000 mg | Freq: Two times a day (BID) | INTRAMUSCULAR | Status: AC
  Administered 2024-01-23 (×2): 120 mg via INTRAVENOUS
  Filled 2024-01-23 (×2): qty 10

## 2024-01-23 MED ORDER — SALINE SPRAY 0.65 % NA SOLN
1.0000 | NASAL | Status: DC | PRN
  Administered 2024-01-23 – 2024-01-24 (×2): 1 via NASAL
  Filled 2024-01-23: qty 44

## 2024-01-23 MED ORDER — POTASSIUM CHLORIDE CRYS ER 20 MEQ PO TBCR
40.0000 meq | EXTENDED_RELEASE_TABLET | Freq: Once | ORAL | Status: AC
  Administered 2024-01-23: 40 meq via ORAL
  Filled 2024-01-23: qty 2

## 2024-01-23 MED ORDER — SODIUM CHLORIDE 0.9 % IV SOLN
500.0000 mg | Freq: Once | INTRAVENOUS | Status: AC
  Administered 2024-01-23: 500 mg via INTRAVENOUS
  Filled 2024-01-23: qty 25

## 2024-01-23 MED ORDER — PERFLUTREN LIPID MICROSPHERE
1.0000 mL | INTRAVENOUS | Status: AC | PRN
  Administered 2024-01-23: 2 mL via INTRAVENOUS

## 2024-01-23 MED ORDER — HEPARIN (PORCINE) 25000 UT/250ML-% IV SOLN
1600.0000 [IU]/h | INTRAVENOUS | Status: DC
  Administered 2024-01-23: 800 [IU]/h via INTRAVENOUS
  Administered 2024-01-24: 1100 [IU]/h via INTRAVENOUS
  Administered 2024-01-25: 1600 [IU]/h via INTRAVENOUS
  Filled 2024-01-23 (×3): qty 250

## 2024-01-23 MED ORDER — IOHEXOL 350 MG/ML SOLN
100.0000 mL | Freq: Once | INTRAVENOUS | Status: AC | PRN
  Administered 2024-01-23: 100 mL via INTRAVENOUS

## 2024-01-23 NOTE — Telephone Encounter (Signed)
 Patient Product/process development scientist completed.    The patient is insured through [employer]. Patient has Medicare and is not eligible for a copay card, but may be able to apply for patient assistance or Medicare RX Payment Plan (Patient Must reach out to their plan, if eligible for payment plan), if available.    Ran test claim for Eliquis 5 mg and the current 30 day co-pay is $0.00.  Ran test claim for Xarelto 20 mg and the current 30 day co-pay is $0.00.    This test claim was processed through [facility]- copay amounts may vary at other pharmacies due to pharmacy/plan contracts, or as the patient moves through the different stages of their insurance plan.     Reyes Sharps, CPHT Pharmacy Technician Patient Advocate Specialist Lead [facility] Patient Advocate Team Direct Number: [phone]  Fax: [phone]

## 2024-01-23 NOTE — Progress Notes (Signed)
 PHARMACY - ANTICOAGULATION CONSULT NOTE  Pharmacy Consult for heparin  Indication: LV thrombus  Allergies  Allergen Reactions   Grass Pollen(K-O-R-T-Swt Vern) Other (See Comments)   Other Other (See Comments)    Pollen    Pollen Extract     Patient Measurements: Height: 5' 6 (167.6 cm) Weight: 107.1 kg (236 lb 1.6 oz) IBW/kg (Calculated) : 63.8  Vital Signs: Temp: 98 F (36.7 C) (10/22 0858) Temp Source: Oral (10/22 0858) BP: 107/63 (10/22 0858) Pulse Rate: 65 (10/22 0858)  Labs: Recent Labs    01/21/24 1316 01/22/24 0223 01/23/24 0305  HGB 7.9*  --  7.8*  HCT 27.4*  --  27.2*  PLT 241  --  213  CREATININE 0.85 0.86 0.90    Estimated Creatinine Clearance: 83.9 mL/min (by C-G formula based on SCr of 0.9 mg/dL).   Medical History: Past Medical History:  Diagnosis Date   Abnormal endocrine laboratory test finding 08/02/2021   Adrenal disorder    Asthma    Benign hypertensive heart disease with CHF (congestive heart failure) (HCC)    Bradycardia    Carpal tunnel syndrome of right wrist 05/08/2019   Class 2 severe obesity with serious comorbidity and body mass index (BMI) of 39.0 to 39.9 in adult 08/08/2021   Coronary artery aneurysm 06/19/2017   Measuring 14 mm on CT from February 2019   Coronary artery disease    Coronary artery disease involving native coronary artery of native heart without angina pectoris 12/08/2014   Overview:  Multiple stents to circumflex artery in March 18, 2014  Formatting of this note might be different from the original. 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   Myocardial infarct (HCC)    OSA  (obstructive sleep apnea)    Secondary hypotension    Sleep apnea 11/28/2016   Strain of thoracic back region 01/17/2023   Strain of thoracic region 01/17/2023   Trigger finger, right middle finger 10/14/2019   Venous insufficiency of left lower extremity     Medications:  Medications Prior to Admission  Medication Sig Dispense Refill Last Dose/Taking   albuterol  (VENTOLIN  HFA) 108 (90 Base) MCG/ACT inhaler Inhale 2 puffs into the lungs every 4 (four) hours as needed for wheezing.   01/21/2024 Morning   aspirin EC 81 MG tablet Take 81 mg by mouth daily.   01/20/2024 at  7:00 PM   atorvastatin (LIPITOR) 80 MG tablet Take 80 mg by mouth daily.   01/20/2024 Evening   clopidogrel  (PLAVIX ) 75 MG tablet TAKE 1 TABLET EVERY DAY 90 tablet 2 01/20/2024 at  7:00 PM   Coenzyme Q10 (COQ-10) 10 MG CAPS Take 10 mg by mouth daily.   01/20/2024 Evening   ferrous sulfate 325 (65 FE) MG EC tablet Take 325 mg by mouth 2 (two) times daily.   01/21/2024 Morning   finasteride  (PROSCAR ) 5 MG tablet Take 1 tablet (5 mg total) by mouth daily. 90 tablet 1 01/20/2024 Evening   furosemide  (LASIX ) 40 MG tablet Take 1 tablet (40 mg total) by mouth daily. (Patient taking differently: Take 120 mg by mouth daily.)   01/21/2024 Morning   IPRATROPIUM BROMIDE IN Place 1  spray into both nostrils 2 (two) times daily.   01/21/2024 Morning   KRILL OIL PO Take 1,000 mg by mouth daily.   01/20/2024 Evening   losartan  (COZAAR ) 25 MG tablet Take 1 tablet (25 mg total) by mouth daily. 90 tablet 3 01/20/2024 Evening   MAGNESIUM-OXIDE 400 (240 Mg) MG tablet Take 1 tablet by mouth daily.   01/20/2024 Evening   montelukast (SINGULAIR) 10 MG tablet Take 10 mg by mouth daily.   01/20/2024 Evening   omeprazole (PRILOSEC) 40 MG capsule Take 40 mg by mouth 2 (two) times daily.   01/21/2024 Morning   OVER THE COUNTER MEDICATION Take 1 tablet by mouth daily. Tylenol ES   01/21/2024 Morning   SYMBICORT 160-4.5 MCG/ACT inhaler Inhale 2 puffs into  the lungs 2 (two) times daily.   01/21/2024 Morning   tamsulosin (FLOMAX) 0.4 MG CAPS capsule Take 0.4 mg by mouth daily.   01/20/2024 Evening   zolpidem (AMBIEN) 10 MG tablet Take 10 mg by mouth at bedtime.   01/20/2024 at  7:00 PM   Scheduled:   atorvastatin  80 mg Oral Daily   clopidogrel   75 mg Oral Daily   dapagliflozin propanediol  10 mg Oral Daily   ferrous sulfate  325 mg Oral BID   finasteride   5 mg Oral Daily   fluticasone furoate-vilanterol  1 puff Inhalation Daily   lidocaine  2 patch Transdermal Q24H   magnesium oxide  400 mg Oral Daily   montelukast  10 mg Oral Daily   pantoprazole  40 mg Oral Daily   tamsulosin  0.4 mg Oral Daily   zolpidem  10 mg Oral QHS    Assessment: 54 you male with Echo showing a large IVC thrombus. He is not on anticoagulation at home. Pharmacy consulted to dose IV heparin. He is noted with a recent admission for GIB  -Hg= 7.8, plt= 213  Goal of Therapy:  Heparin level 0.3-0.5 units/ml Monitor platelets by anticoagulation protocol: Yes   Plan:  -no heparin bolus with low hg and hx GIB  -Start heparin at 800 units/hr -Heparin level in 8 hours and daily wth CBC daily  [name], PharmD Clinical Pharmacist **Pharmacist phone directory can now be found on [email or web address] ([email or web address]).  Listed under [facility].

## 2024-01-23 NOTE — Progress Notes (Signed)
 Peripherally Inserted Central Catheter Placement  The IV Nurse has discussed with the patient and/or persons authorized to consent for the patient, the purpose of this procedure and the potential benefits and risks involved with this procedure.  The benefits include less needle sticks, lab draws from the catheter, and the patient may be discharged home with the catheter. Risks include, but not limited to, infection, bleeding, blood clot (thrombus formation), and puncture of an artery; nerve damage and irregular heartbeat and possibility to perform a PICC exchange if needed/ordered by physician.  Alternatives to this procedure were also discussed.  Bard Power PICC patient education guide, fact sheet on infection prevention and patient information card has been provided to patient /or left at bedside.    PICC Placement Documentation  PICC Double Lumen 01/23/24 Right Basilic 41 cm 0 cm (Active)  Indication for Insertion or Continuance of Line Chronic illness with exacerbations (CF, Sickle Cell, etc.) 01/23/24 1031  Exposed Catheter (cm) 0 cm 01/23/24 1031  Site Assessment Clean, Dry, Intact 01/23/24 1031  Lumen #1 Status Saline locked;Blood return noted 01/23/24 1031  Lumen #2 Status Saline locked;Blood return noted 01/23/24 1031  Dressing Type Transparent;Securing device 01/23/24 1031  Dressing Status Antimicrobial disc/dressing in place;Clean, Dry, Intact 01/23/24 1031  Line Care Connections checked and tightened 01/23/24 1031  Line Adjustment (NICU/IV Team Only) No 01/23/24 1031  Dressing Intervention New dressing 01/23/24 1031  Dressing Change Due 01/30/24 01/23/24 1031       Phillip Dunn 01/23/2024, 10:31 AM

## 2024-01-23 NOTE — Progress Notes (Signed)
 Bilateral lower extremity venous duplex has been completed. Preliminary results can be found in CV Proc through chart review.   01/23/24 4:20 PM Cathlyn Collet RVT

## 2024-01-23 NOTE — TOC Initial Note (Signed)
 Transition of Care [facility]) - Initial/Assessment Note    Patient Details  Name: Phillip Dunn MRN: [id number] Date of Birth: 1950/12/09  Transition of Care [facility]) CM/SW Contact:    Arlana JINNY Nicholaus ISRAEL Phone Number: [phone] 01/23/2024, 11:15 AM  Clinical Narrative:   10:35 AM- HF CSW attempted to meet with patient at bedside. Patient was in a sterile procedure. CSW will follow up with patient at a more appropriate time.        HF CSW met with patient and wife at bedside. Patient and wife stated that they live together. Patient stated that he has no current [facility] services. Patient stated that he uses a cane, walker, and oxygen . Patient stated that he has a scale at home. Patient stated that he has a PCP. CSW explained that a hospital follow up appointment is typically scheduled closer towards dc. Patient is agreeable.   HF CSW/CM will continue to follow and monitor for dc readiness.                  Patient Goals and CMS Choice            Expected Discharge Plan and Services                                              Prior Living Arrangements/Services                       Activities of Daily Living   ADL Screening (condition at time of admission) Independently performs ADLs?: Yes (appropriate for developmental age) Is the patient deaf or have difficulty hearing?: No Does the patient have difficulty seeing, even when wearing glasses/contacts?: No Does the patient have difficulty concentrating, remembering, or making decisions?: No  Permission Sought/Granted                  Emotional Assessment              Admission diagnosis:  CHF (congestive heart failure) (HCC) [I50.9] Anemia, unspecified type [D64.9] Acute on chronic congestive heart failure, unspecified heart failure type (HCC) [I50.9] Patient Active Problem List   Diagnosis Date Noted   Normocytic anemia 01/22/2024   History of duodenal ulcer 01/22/2024   Chronic  hypoxic respiratory failure (HCC) 01/22/2024   Obesity, Class III, BMI 40-49.9 (morbid obesity) (HCC) 01/22/2024   Benign prostatic hyperplasia 01/22/2024   Acute on chronic systolic CHF (congestive heart failure) (HCC) 01/21/2024   Low back pain 01/17/2023   Strain of thoracic back region 01/17/2023   Strain of thoracic region 01/17/2023   Low serum adrenocorticotropic hormone (ACTH ) 08/08/2021   Class 2 severe obesity with serious comorbidity and body mass index (BMI) of 39.0 to 39.9 in adult 08/08/2021   Elevated cortisol level 08/02/2021   Abnormal endocrine laboratory test finding 08/02/2021   Chronic venous insufficiency    Secondary hypotension    OSA on CPAP    Myocardial infarct (HCC)    Hepatitis    Hyperlipidemia    Diabetes mellitus without complication (HCC)    CAD S/P percutaneous coronary angioplasty    Chronic obstructive pulmonary disease (HCC)    Bradycardia    Benign hypertensive heart disease with CHF (congestive heart failure) (HCC)    Asthma    Adrenal disorder    Trigger finger, right middle finger 10/14/2019   Ganglion cyst  of dorsum of right wrist 07/07/2019   Carpal tunnel syndrome of right wrist 05/08/2019   Left wrist pain 05/07/2019   Coronary artery aneurysm 06/19/2017   Sleep apnea 11/28/2016   Essential hypertension 03/03/2015   Coronary artery disease involving native coronary artery of native heart without angina pectoris 12/08/2014   Dyslipidemia 12/08/2014   PCP:  Keren Vicenta BRAVO, MD Pharmacy:   [facility] - [place], [place] - [address] [place] [address] Phone: [phone] Fax: [phone]  [facility] [address] [place] - [address] [facility] [place] [address] Phone: [phone] Fax: [phone]     Social Drivers of Health (SDOH) Social History: SDOH Screenings   Food Insecurity: No Food Insecurity (01/21/2024)  Housing: Low Risk   (01/21/2024)  Transportation Needs: No Transportation Needs (01/21/2024)  Utilities: Not At Risk (01/21/2024)  Social Connections: Moderately Isolated (01/21/2024)  Tobacco Use: Medium Risk (01/21/2024)   SDOH Interventions:     Readmission Risk Interventions     No data to display

## 2024-01-23 NOTE — Progress Notes (Addendum)
 Advanced Heart Failure Rounding Note  Cardiologist: Lamar Fitch, MD  HF Consulting Cardiologist: Dr. Rolan  Chief Complaint: Acute on chronic CHF  Subjective:    Reports UOP less brisk over last 24 hrs. Doubt 20 lb weight loss (standing today, prior weights in bed).   Winded with minimal exertion. Comfortable at rest. Appetite okay.   Objective:   Weight Range: 107.1 kg Body mass index is 38.11 kg/m.   Vital Signs:   Temp:  [97.4 F (36.3 C)-97.8 F (36.6 C)] 97.4 F (36.3 C) (10/22 0430) Pulse Rate:  [60-72] 65 (10/21 1935) Resp:  [14-21] 14 (10/22 0039) BP: (83-94)/(54-66) 94/66 (10/21 1935) SpO2:  [93 %-100 %] 93 % (10/22 0430) Weight:  [107.1 kg] 107.1 kg (10/22 0400) Last BM Date :  (PTA)  Weight change: Filed Weights   01/21/24 1313 01/22/24 0342 01/23/24 0400  Weight: 116.4 kg 116.9 kg 107.1 kg    Intake/Output:   Intake/Output Summary (Last 24 hours) at 01/23/2024 9287 Last data filed at 01/23/2024 0400 Gross per 24 hour  Intake 960 ml  Output 2650 ml  Net -1690 ml      Physical Exam    General:  Fatigued appearing elderly male Neck: JVP to ear Cor: Regular rate & rhythm. 2/6 SEM RUSB. Lungs: Clear Abdomen: obese, distended Extremities: 2-3+ edema to thighs, + UNNA boots Neuro: Alert & orientedx3. Affect pleasant   Telemetry   SB/SR 50s-60s, occasional PVCs  Labs    CBC Recent Labs    01/21/24 1316 01/23/24 0305  WBC 8.1 7.0  HGB 7.9* 7.8*  HCT 27.4* 27.2*  MCV 83.3 83.4  PLT 241 213   Basic Metabolic Panel Recent Labs    89/79/74 1956 01/22/24 0223 01/23/24 0305  NA  --  140 140  K  --  3.5 3.9  CL  --  104 105  CO2  --  28 26  GLUCOSE  --  96 111*  BUN  --  15 15  CREATININE  --  0.86 0.90  CALCIUM  --  8.5* 8.9  MG 1.9  --  2.0  PHOS 3.2  --   --    Liver Function Tests No results for input(s): AST, ALT, ALKPHOS, BILITOT, PROT, ALBUMIN in the last 72 hours. No results for input(s):  LIPASE, AMYLASE in the last 72 hours. Cardiac Enzymes No results for input(s): CKTOTAL, CKMB, CKMBINDEX, TROPONINI in the last 72 hours.  BNP: BNP (last 3 results) No results for input(s): BNP in the last 8760 hours.  ProBNP (last 3 results) Recent Labs    11/23/23 0926 01/21/24 1316  PROBNP 624* 750.0*     D-Dimer No results for input(s): DDIMER in the last 72 hours. Hemoglobin A1C No results for input(s): HGBA1C in the last 72 hours. Fasting Lipid Panel No results for input(s): CHOL, HDL, LDLCALC, TRIG, CHOLHDL, LDLDIRECT in the last 72 hours. Thyroid  Function Tests No results for input(s): TSH, T4TOTAL, T3FREE, THYROIDAB in the last 72 hours.  Invalid input(s): FREET3  Other results:   Imaging    US  EKG SITE RITE Result Date: 01/22/2024 If Site Rite image not attached, placement could not be confirmed due to current cardiac rhythm.    Medications:     Scheduled Medications:  aspirin EC  81 mg Oral Daily   atorvastatin  80 mg Oral Daily   clopidogrel   75 mg Oral Daily   dapagliflozin propanediol  10 mg Oral Daily   ferrous sulfate  325  mg Oral BID   finasteride   5 mg Oral Daily   fluticasone furoate-vilanterol  1 puff Inhalation Daily   furosemide   80 mg Intravenous Q12H   lidocaine  2 patch Transdermal Q24H   magnesium oxide  400 mg Oral Daily   montelukast  10 mg Oral Daily   pantoprazole  40 mg Oral Daily   tamsulosin  0.4 mg Oral Daily   zolpidem  10 mg Oral QHS    Infusions:   PRN Medications: albuterol     Patient Profile   Phillip Dunn is a 73 y.o. male with chronic combined systolic and diastolic HF, CAD s/p CABG, chronic hypoxic resp failure on 4L, HTN, HLD, obesity, OSA on CPAP, chronic leg swelling, and GIB. AHF team to see with A/C HFpEF and hypotension.     Assessment/Plan   A/C HFpEF - Echo 9/24: EF 55-60%. RV normal, trivial MR, mild AV stenosis, AV mean gradient 12 mmHg.  - Echo  6/25 [facility]): EF 45-50%, basal to mid inferior and inferolateral AK, mild RV dilatation with okay function, RVSP 57 mmhg - See last LHC in #2.  - suspect component of ICM w/ inferior/inferolateral WMAs on 6/25 echo - NYHA IV on admission.  - Massively volume overloaded on exam. Exam difficult as he has hx chronic BLE edema.  - PICC placement pending - Hasn't had robust response to IV lasix  80 BID. Takes 120 mg po lasix  at home. Increase IV lasix  to 120 IV BID. May require metolazone - Off losartan  d/t hypotension - Continue farxiga 10 mg daily - continue UNNA boots  - Echo pending   CAD s/p CABG and multiple PCI - Coronary CT 8/19: severe 3V CAD, occluded prox RCA, mid LAD/ patent LIMA to mid LAD, mid and distal LAD. LCx long stented area, unable to determine patency.  - [facility] ([facility]) 01/2019' multivessel CAD, acute OM1 occlusion, known CTO of RCA and SVG to PDA, known patent LIMA to LAD graft, mild LV dysfunction. Unsuccessful PCI of OM1 due to thrombus burden within the known OM1 aneurysmal segment. guidewire would not fully traverse the occluded segment which thrombosed after brief interruption of DAPT 5 years after prior OM intervention. Medically managed.  - HsTrop flat - Continue ASA/plavix , statin - Denies CP, reports chest pressure, worse when laying flat. Suspect 2/2 fluid.    HTN> hypotension - off losartan  as above - Add back as able once diuresed.    Recent GIB - Scope 9/25 with duodenal ulcers and barretts esophagus.  - Hgb 7s. Has denied overt bleeding - Transfuse Hgb < 7 - Iron stores are low with T sat 11%. Give IV iron. - May need to stop ASA   Venous insufficiency Chronic leg swelling - UNNA boots   COPD Chronic hypoxic respiratory failure - on home 4L   OSA on CPAP - Continue CPAP      Length of Stay: 1  FINCH, LINDSAY N, PA-C  01/23/2024, 7:12 AM  Advanced Heart Failure Team Pager [phone] (M-F; 7a - 5p)  Please contact [facility] for  night-coverage after hours (5p -7a ) and weekends on [email or web address]  Patient seen and examined with the above-signed Advanced Practice Provider and/or Housestaff. I personally reviewed laboratory data, imaging studies and relevant notes. I independently examined the patient and formulated the important aspects of the plan. I have edited the note to reflect any of my changes or salient points. I have personally discussed the plan with the patient and/or family.  Diuresing modestly on  lasix  80 IV bid. Denies SOB.   Echo  reviewed EF 45-50%. RV mild HK RVSP 50 Concern for mass/clot in IVC. CT chest no PE. IVC not well visualized (discussed personally with Radiologist)  General:  Obese male lying in bed No resp difficulty HEENT: normal Neck: supple. JVP to ear Carotids 2+ bilat; no bruits. No lymphadenopathy or thryomegaly appreciated. Cor: PMI nondisplaced. Regular rate & rhythm. No rubs, gallops or murmurs. Lungs: clear Abdomen: obese soft, nontender, nondistended. No hepatosplenomegaly. No bruits or masses. Good bowel sounds. Extremities: no cyanosis, clubbing, rash, 2+ edema + UNNA Neuro: alert & orientedx3, cranial nerves grossly intact. moves all 4 extremities w/o difficulty. Affect pleasant  He remains volume overloaded. Will increase IV lasix . Add metolazone as needed  He has had multiple hospitalizations over past few months. He tells me he drinks 10 cups of coffee per day, iced tea and alcohol. He said he would be willing to cut back to 8 cups of coffee.  We had a long talk about need for behavior change to avoid recurrent readmits and progressive worsened HF trajectory.   Will get MRV to better look at IVC density on echo. May be artifactual. CT without PE or renal mass.   Toribio Fuel, MD  4:03 PM

## 2024-01-23 NOTE — Telephone Encounter (Signed)
 Pharmacy Patient Advocate Encounter  Insurance verification completed.    The patient is insured through [employer]. Patient has Medicare and is not eligible for a copay card, but may be able to apply for patient assistance or Medicare RX Payment Plan (Patient Must reach out to their plan, if eligible for payment plan), if available.    Ran test claim for Jardiance 10mg  and the current 30 day co-pay is $0.  Ran test claim for Farxiga 10mg  and the current 30 day co-pay is $0.   This test claim was processed through [employer]- copay amounts may vary at other pharmacies due to [employer], or as the patient moves through the different stages of their insurance plan.

## 2024-01-23 NOTE — Plan of Care (Signed)
  Problem: Education: Goal: Knowledge of General Education information will improve Description: Including pain rating scale, medication(s)/side effects and non-pharmacologic comfort measures Outcome: Progressing   Problem: Health Behavior/Discharge Planning: Goal: Ability to manage health-related needs will improve Outcome: Progressing   Problem: Clinical Measurements: Goal: Ability to maintain clinical measurements within normal limits will improve Outcome: Progressing Goal: Will remain free from infection Outcome: Progressing Goal: Diagnostic test results will improve Outcome: Progressing Goal: Respiratory complications will improve Outcome: Progressing Goal: Cardiovascular complication will be avoided Outcome: Progressing   Problem: Activity: Goal: Risk for activity intolerance will decrease Outcome: Progressing   Problem: Nutrition: Goal: Adequate nutrition will be maintained Outcome: Progressing   Problem: Coping: Goal: Level of anxiety will decrease Outcome: Progressing   Problem: Elimination: Goal: Will not experience complications related to bowel motility Outcome: Progressing Goal: Will not experience complications related to urinary retention Outcome: Progressing   Problem: Pain Managment: Goal: General experience of comfort will improve and/or be controlled Outcome: Progressing   Problem: Safety: Goal: Ability to remain free from injury will improve Outcome: Progressing   Problem: Skin Integrity: Goal: Risk for impaired skin integrity will decrease Outcome: Progressing   Problem: Education: Goal: Ability to demonstrate management of disease process will improve Outcome: Progressing Goal: Ability to verbalize understanding of medication therapies will improve Outcome: Progressing   Problem: Activity: Goal: Capacity to carry out activities will improve Outcome: Progressing   Problem: Cardiac: Goal: Ability to achieve and maintain adequate  cardiopulmonary perfusion will improve Outcome: Progressing

## 2024-01-24 ENCOUNTER — Inpatient Hospital Stay (HOSPITAL_COMMUNITY)

## 2024-01-24 DIAGNOSIS — I5023 Acute on chronic systolic (congestive) heart failure: Secondary | ICD-10-CM | POA: Diagnosis not present

## 2024-01-24 LAB — BASIC METABOLIC PANEL WITH GFR
Anion gap: 7 (ref 5–15)
BUN: 12 mg/dL (ref 8–23)
CO2: 29 mmol/L (ref 22–32)
Calcium: 8.4 mg/dL — ABNORMAL LOW (ref 8.9–10.3)
Chloride: 107 mmol/L (ref 98–111)
Creatinine, Ser: 0.74 mg/dL (ref 0.61–1.24)
GFR, Estimated: >60 mL/min (ref >60)
Glucose, Bld: 83 mg/dL (ref 70–99)
Potassium: 3.3 mmol/L — ABNORMAL LOW (ref 3.5–5.1)
Sodium: 143 mmol/L (ref 135–145)

## 2024-01-24 LAB — HEPARIN LEVEL (UNFRACTIONATED)
Heparin Unfractionated: 0.1 [IU]/mL — ABNORMAL LOW (ref 0.30–0.70)
Heparin Unfractionated: <0.1 [IU]/mL — ABNORMAL LOW (ref 0.30–0.70)
Heparin Unfractionated: <0.1 [IU]/mL — ABNORMAL LOW (ref 0.30–0.70)

## 2024-01-24 LAB — MAGNESIUM: Magnesium: 2 mg/dL (ref 1.7–2.4)

## 2024-01-24 LAB — CBC
HCT: 25.2 % — ABNORMAL LOW (ref 39.0–52.0)
HCT: 29.3 % — ABNORMAL LOW (ref 39.0–52.0)
Hemoglobin: 7.2 g/dL — ABNORMAL LOW (ref 13.0–17.0)
Hemoglobin: 8.4 g/dL — ABNORMAL LOW (ref 13.0–17.0)
MCH: 23.8 pg — ABNORMAL LOW (ref 26.0–34.0)
MCH: 23.9 pg — ABNORMAL LOW (ref 26.0–34.0)
MCHC: 28.6 g/dL — ABNORMAL LOW (ref 30.0–36.0)
MCHC: 28.7 g/dL — ABNORMAL LOW (ref 30.0–36.0)
MCV: 83.2 fL (ref 80.0–100.0)
MCV: 83.2 fL (ref 80.0–100.0)
Platelets: 185 K/uL (ref 150–400)
Platelets: 210 K/uL (ref 150–400)
RBC: 3.03 MIL/uL — ABNORMAL LOW (ref 4.22–5.81)
RBC: 3.52 MIL/uL — ABNORMAL LOW (ref 4.22–5.81)
RDW: 22 % — ABNORMAL HIGH (ref 11.5–15.5)
RDW: 22 % — ABNORMAL HIGH (ref 11.5–15.5)
WBC: 6.7 K/uL (ref 4.0–10.5)
WBC: 9 K/uL (ref 4.0–10.5)
nRBC: 0 % (ref 0.0–0.2)
nRBC: 0 % (ref 0.0–0.2)

## 2024-01-24 MED ORDER — POTASSIUM CHLORIDE CRYS ER 20 MEQ PO TBCR
60.0000 meq | EXTENDED_RELEASE_TABLET | Freq: Two times a day (BID) | ORAL | Status: AC
  Administered 2024-01-24 (×2): 60 meq via ORAL
  Filled 2024-01-24 (×2): qty 3

## 2024-01-24 MED ORDER — FUROSEMIDE 10 MG/ML IJ SOLN
120.0000 mg | Freq: Two times a day (BID) | INTRAVENOUS | Status: AC
  Administered 2024-01-24 (×2): 120 mg via INTRAVENOUS
  Filled 2024-01-24: qty 120
  Filled 2024-01-24: qty 10

## 2024-01-24 MED ORDER — IPRATROPIUM BROMIDE 0.06 % NA SOLN
2.0000 | Freq: Two times a day (BID) | NASAL | Status: DC
  Filled 2024-01-24: qty 15

## 2024-01-24 MED ORDER — CHLORHEXIDINE GLUCONATE CLOTH 2 % EX PADS
6.0000 | MEDICATED_PAD | Freq: Every day | CUTANEOUS | Status: DC
  Administered 2024-01-24 – 2024-01-26 (×3): 6 via TOPICAL

## 2024-01-24 MED ORDER — PREDNISONE 20 MG PO TABS: 30.0000 mg | ORAL_TABLET | Freq: Every day | ORAL | Status: DC

## 2024-01-24 MED ORDER — GADOBUTROL 1 MMOL/ML IV SOLN
10.0000 mL | Freq: Once | INTRAVENOUS | Status: AC | PRN
  Administered 2024-01-24: 10 mL via INTRAVENOUS

## 2024-01-24 MED ORDER — IPRATROPIUM BROMIDE 0.06 % NA SOLN
1.0000 | Freq: Two times a day (BID) | NASAL | Status: DC
  Administered 2024-01-24 – 2024-01-26 (×5): 1 via NASAL
  Filled 2024-01-24: qty 15

## 2024-01-24 MED ORDER — SPIRONOLACTONE 12.5 MG HALF TABLET
12.5000 mg | ORAL_TABLET | Freq: Every day | ORAL | Status: DC
  Administered 2024-01-24: 12.5 mg via ORAL
  Filled 2024-01-24: qty 1

## 2024-01-24 MED ORDER — SODIUM CHLORIDE 0.9% FLUSH: 10.0000 mL | INTRAVENOUS | Status: DC | PRN

## 2024-01-24 MED ORDER — LIDOCAINE 5 % EX PTCH
2.0000 | MEDICATED_PATCH | CUTANEOUS | Status: AC
  Administered 2024-01-24 – 2024-01-25 (×2): 2 via TRANSDERMAL
  Filled 2024-01-24 (×2): qty 2

## 2024-01-24 MED ORDER — METOLAZONE 2.5 MG PO TABS
2.5000 mg | ORAL_TABLET | Freq: Once | ORAL | Status: AC
  Administered 2024-01-24: 2.5 mg via ORAL
  Filled 2024-01-24: qty 1

## 2024-01-24 MED ORDER — SODIUM CHLORIDE 0.9% FLUSH
10.0000 mL | Freq: Two times a day (BID) | INTRAVENOUS | Status: DC
  Administered 2024-01-24: 10 mL
  Administered 2024-01-24: 20 mL
  Administered 2024-01-25 (×2): 10 mL

## 2024-01-24 NOTE — Progress Notes (Signed)
 Mobility Specialist Progress Note:    01/24/24 1553  Mobility  Activity Ambulated with assistance (In room, ankle pumps, leg ext, leg lifts)  Level of Assistance Contact guard assist, steadying assist  Assistive Device None  Distance Ambulated (ft) 15 ft  Range of Motion/Exercises Active;Left leg;Right leg  Activity Response Tolerated well  Mobility Referral Yes  Mobility visit 1 Mobility  Mobility Specialist Start Time (ACUTE ONLY) 1553  Mobility Specialist Stop Time (ACUTE ONLY) 1559  Mobility Specialist Time Calculation (min) (ACUTE ONLY) 6 min   Received pt sitting in recliner w/ all needs met agreeable to session. No c/o any symptoms. Pt able to stand, move, ambulate, and perform exercises well. Returned pt to recliner w/ all needs met.   Venetia Keel Mobility Specialist Please Neurosurgeon or Rehab Office at [phone]

## 2024-01-24 NOTE — Plan of Care (Signed)

## 2024-01-24 NOTE — Progress Notes (Signed)
 PROGRESS NOTE    Phillip Dunn  FMW:[id number] DOB: [birth year] DOA: 01/21/2024 PCP: Keren Vicenta BRAVO, MD  73/M w chronic combined CHF, chronic hypoxic respiratory failure on 4 L [name], obesity, T2DM, HLD, HTN, MI/CAD s/p CABG and stenting, lumbar radiculopathy, OSA on CPAP, COPD, anemia, venous insufficiency, chronic leg swelling, and a recent hospitalization for GI bleed at outside hospital presented to the ED with progressive shortness of breath with intermittent chest tightness. [facility] ED Course:VSS, labs significant for Hgb 7.9, normal renal function, proBNP 750, troponin 36-36, mag 1.9, Phos 3.2. CXR shows mild interstitial edema and trace bilateral pleural effusions,  - Admitted, started on diuretics - 10/21, CHF team consulting - 10/22, echo with concern for large IVC thrombus, CTA and Dopplers negative  Subjective: - Feels better, breathing is improving  Assessment and Plan:  Acute on chronic systolic and diastolic heart failure - Echo this admit with EF 45-50%, inferior lateral hypokinesis, moderate TR  - CHF team following, continue Lasix  120 mg twice daily, metolazone today  - Continue Farxiga, starting Aldactone -Continue Unna boots, increase activity  Large IVC thrombus -Noted on echo yesterday,?  Artifact -CTA chest negative for PE, Dopplers negative for DVT -Plan for MR angio ordered per cards after discussion with radiology -Remains on IV heparin for now  CAD/CABG, PCI -LHC 10/20 with multivessel CAD -On medical management, currently on Plavix    Normocytic anemia Hx of duodenal ulcer - recently admitted to [facility] on 9/26 for GI bleed and acute CHF and discharged on 10/3 - An EGD on 9/28 revealed a Barrett's esophagus and duodenal ulcer with oozing hemorrhage treated with 2 clips - sp 4 units PRBC, hb relatively stable now  - Given IV iron, monitor hemoglobin, mild downtrend noted,  stop Plavix  if he needs anticoagulation -  Follow-up with [facility] at discharge   T2DM - CBGs are stable, diet controlled   Venous insufficiency -Chronic leg swelling - diuretics as above   COPD/Chronic hypoxic respiratory failure - Remains on home 4 L [place], no wheezing or cough - Continue Singulair and Breo Ellipta - As needed DuoNeb   CAD s/p CABG and stenting - Continue Plavix  and aspirin   HLD - Continue atorvastatin   OSA - Continue CPAP at night   BPH - Continue Flomax and finasteride    # Class III obesity Body mass index is 41.43 kg/m.     DVT prophylaxis: SCDs Code Status: full Family Communication: wife at bedside Disposition Plan: Home in few days  Consultants:    Procedures:   Antimicrobials:    Objective: Vitals:   01/24/24 0337 01/24/24 0400 01/24/24 0422 01/24/24 0733  BP:   113/82 (!) 93/49  Pulse:   (!) 56 71  Resp:  (P) 19 19 20   Temp:   97.6 F (36.4 C) 97.7 F (36.5 C)  TempSrc:   Axillary Oral  SpO2:   100% 94%  Weight: 112.8 kg     Height:        Intake/Output Summary (Last 24 hours) at 01/24/2024 0959 Last data filed at 01/24/2024 0905 Gross per 24 hour  Intake 1250.19 ml  Output 3700 ml  Net -2449.81 ml   Filed Weights   01/22/24 0342 01/23/24 0400 01/24/24 0337  Weight: 116.9 kg 107.1 kg 112.8 kg    Examination:  Gen: Awake, Alert, Oriented X 3,  HEENT: obese, cannot assess JVD Lungs: Good air movement bilaterally, CTAB CVS: S1S2/RRR Abd: soft, Non tender, non distended,  BS present Extremities: 2plus edema Skin: no new rashes on exposed skin    Data Reviewed:   CBC: Recent Labs  Lab 01/21/24 1316 01/23/24 0305 01/24/24 0507  WBC 8.1 7.0 6.7  HGB 7.9* 7.8* 7.2*  HCT 27.4* 27.2* 25.2*  MCV 83.3 83.4 83.2  PLT 241 213 185   Basic Metabolic Panel: Recent Labs  Lab 01/21/24 1316 01/21/24 1956 01/22/24 0223 01/23/24 0305 01/24/24 0507  NA 142  --  140 140 143  K 3.9  --  3.5 3.9 3.3*  CL 104  --  104 105 107  CO2 26  --  28 26 29    GLUCOSE 102*  --  96 111* 83  BUN 17  --  15 15 12   CREATININE 0.85  --  0.86 0.90 0.74  CALCIUM 9.5  --  8.5* 8.9 8.4*  MG  --  1.9  --  2.0 2.0  PHOS  --  3.2  --   --   --    GFR: Estimated Creatinine Clearance: 97 mL/min (by C-G formula based on SCr of 0.74 mg/dL). Liver Function Tests: No results for input(s): AST, ALT, ALKPHOS, BILITOT, PROT, ALBUMIN in the last 168 hours. No results for input(s): LIPASE, AMYLASE in the last 168 hours. No results for input(s): AMMONIA in the last 168 hours. Coagulation Profile: No results for input(s): INR, PROTIME in the last 168 hours. Cardiac Enzymes: No results for input(s): CKTOTAL, CKMB, CKMBINDEX, TROPONINI in the last 168 hours. BNP (last 3 results) Recent Labs    11/23/23 0926 01/21/24 1316  PROBNP 624* 750.0*   HbA1C: No results for input(s): HGBA1C in the last 72 hours. CBG: No results for input(s): GLUCAP in the last 168 hours. Lipid Profile: No results for input(s): CHOL, HDL, LDLCALC, TRIG, CHOLHDL, LDLDIRECT in the last 72 hours. Thyroid  Function Tests: No results for input(s): TSH, T4TOTAL, FREET4, T3FREE, THYROIDAB in the last 72 hours. Anemia Panel: Recent Labs    01/23/24 0305  VITAMINB12 806  FOLATE >20.0  FERRITIN 10*  TIBC 482*  IRON 51  RETICCTPCT 4.1*   Urine analysis: No results found for: COLORURINE, APPEARANCEUR, LABSPEC, PHURINE, GLUCOSEU, HGBUR, BILIRUBINUR, KETONESUR, PROTEINUR, UROBILINOGEN, NITRITE, LEUKOCYTESUR Sepsis Labs: @LABRCNTIP (procalcitonin:4,lacticidven:4)  )No results found for this or any previous visit (from the past 240 hours).   Radiology Studies: VAS US  LOWER EXTREMITY VENOUS (DVT) Result Date: 01/23/2024  Lower Venous DVT Study Patient Name:  Phillip Dunn  Date of Exam:   01/23/2024 Medical Rec #: [id number]          Accession #:    [id number] Date of Birth: 01/11/1951         Patient Gender:  M Patient Age:   8 years Exam Location:  [place] Procedure:      VAS US  LOWER EXTREMITY VENOUS (DVT) Referring Phys: SIGURD PAC --------------------------------------------------------------------------------  Indications: Edema.  Risk Factors: None identified. Limitations: Body habitus, poor ultrasound/tissue interface and bandages. Comparison Study: No prior studies. Performing Technologist: Cordella Collet RVT  Examination Guidelines: A complete evaluation includes B-mode imaging, spectral Doppler, color Doppler, and power Doppler as needed of all accessible portions of each vessel. Bilateral testing is considered an integral part of a complete examination. Limited examinations for reoccurring indications may be performed as noted. The reflux portion of the exam is performed with the patient in reverse Trendelenburg.  +---------+---------------+---------+-----------+----------+-------------------+ RIGHT    CompressibilityPhasicitySpontaneityPropertiesThrombus Aging      +---------+---------------+---------+-----------+----------+-------------------+ CFV      Full  Yes      Yes                                      +---------+---------------+---------+-----------+----------+-------------------+ SFJ      Full                                                             +---------+---------------+---------+-----------+----------+-------------------+ FV Prox  Full                                                             +---------+---------------+---------+-----------+----------+-------------------+ FV Mid   Full                                                             +---------+---------------+---------+-----------+----------+-------------------+ FV Distal               Yes      Yes                                      +---------+---------------+---------+-----------+----------+-------------------+ PFV      Full                                                              +---------+---------------+---------+-----------+----------+-------------------+ POP      Full           Yes      Yes                                      +---------+---------------+---------+-----------+----------+-------------------+ PTV                                                   Not well visualized +---------+---------------+---------+-----------+----------+-------------------+ PERO                                                  Not well visualized +---------+---------------+---------+-----------+----------+-------------------+   +---------+---------------+---------+-----------+----------+-------------------+ LEFT     CompressibilityPhasicitySpontaneityPropertiesThrombus Aging      +---------+---------------+---------+-----------+----------+-------------------+ CFV      Full           Yes      Yes                                      +---------+---------------+---------+-----------+----------+-------------------+  SFJ      Full                                                             +---------+---------------+---------+-----------+----------+-------------------+ FV Prox  Full                                                             +---------+---------------+---------+-----------+----------+-------------------+ FV Mid   Full                                                             +---------+---------------+---------+-----------+----------+-------------------+ FV Distal               Yes      Yes                                      +---------+---------------+---------+-----------+----------+-------------------+ PFV      Full                                                             +---------+---------------+---------+-----------+----------+-------------------+ POP      Full           Yes      Yes                                       +---------+---------------+---------+-----------+----------+-------------------+ PTV                                                   Not well visualized +---------+---------------+---------+-----------+----------+-------------------+ PERO                                                  Not well visualized +---------+---------------+---------+-----------+----------+-------------------+     Summary: RIGHT: - There is no evidence of deep vein thrombosis in the lower extremity. However, portions of this examination were limited- see technologist comments above.  - No cystic structure found in the popliteal fossa.  LEFT: - There is no evidence of deep vein thrombosis in the lower extremity. However, portions of this examination were limited- see technologist comments above.  - No cystic structure found in the popliteal fossa.  *See table(s) above for measurements and observations. Electronically signed by Debby Robertson on 01/23/2024 at 4:44:46 PM.    Final  CT Angio Chest Pulmonary Embolism (PE) W or WO Contrast Result Date: 01/23/2024 CLINICAL DATA:  thrombus, vs mass; Pulmonary embolism (PE) suspected, high prob. Shortness of breath. Mass versus thrombus. EXAM: CT ANGIOGRAPHY CHEST, ABDOMEN AND PELVIS TECHNIQUE: Non-contrast CT of the chest was initially obtained. Multidetector CT imaging through the chest, abdomen and pelvis was performed using the standard protocol during bolus administration of intravenous contrast. Multiplanar reconstructed images and MIPs were obtained and reviewed to evaluate the vascular anatomy. RADIATION DOSE REDUCTION: This exam was performed according to the departmental dose-optimization program which includes automated exposure control, adjustment of the mA and/or kV according to patient size and/or use of iterative reconstruction technique. CONTRAST:  100mL OMNIPAQUE IOHEXOL 350 MG/ML SOLN COMPARISON:  None Available. FINDINGS: CTA CHEST FINDINGS Cardiovascular:  Heart is normal size. Aorta is normal caliber. Prior CABG. Extensive aortic atherosclerosis. No filling defects in the pulmonary arteries to suggest pulmonary emboli. Mediastinum/Nodes: No mediastinal, hilar, or axillary adenopathy. Trachea and esophagus are unremarkable. Thyroid  unremarkable. Lungs/Pleura: Trace bilateral pleural effusions with dependent/compressive atelectasis in the lower lobes. Musculoskeletal: Chest wall soft tissues are unremarkable. No acute bony abnormality. Review of the MIP images confirms the above findings. CTA ABDOMEN AND PELVIS FINDINGS VASCULAR Aorta: Slight aneurysmal dilatation of the infrarenal abdominal aorta measuring 3 cm maximally. Diffuse aortic atherosclerosis. No dissection. Celiac: Patent SMA: Patent Renals: Patent IMA: Patent Inflow: Diffuse atherosclerosis. No aneurysm, dissection or significant stenosis. Veins: No obvious venous abnormality within the limitations of this arterial phase study. Review of the MIP images confirms the above findings. NON-VASCULAR Hepatobiliary: No focal hepatic abnormality. Gallbladder unremarkable. Pancreas: No focal abnormality or ductal dilatation. Spleen: No focal abnormality.  Normal size.  With Adrenals/Urinary Tract: Bilateral low-density enlargement of both adrenal glands compatible with hyperplasia or adenomas. No renal mass or hydronephrosis. Urinary bladder unremarkable. Stomach/Bowel: Normal appendix. Sigmoid diverticulosis. No active diverticulitis. Stomach and small bowel decompressed, unremarkable. Lymphatic: No adenopathy Reproductive: No visible focal abnormality. Other: No free fluid or free air. Musculoskeletal: No acute bony abnormality. Degenerative disc and facet disease throughout the lumbar spine. Review of the MIP images confirms the above findings. IMPRESSION: Diffuse aortic atherosclerosis. Slight aneurysmal dilatation of the infrarenal abdominal aorta measuring 3 cm. Recommend follow-up ultrasound every 3 years.  (Ref.: J Vasc Surg. 2018; 67:2-77 and J Am Coll Radiol 2013;10(10):789-794.) Trace bilateral pleural effusions. Dependent/compressive atelectasis in the lower lobes. Sigmoid diverticulosis.  No active diverticulitis. No acute findings in the abdomen or pelvis. Electronically Signed   By: Franky Crease M.D.   On: 01/23/2024 12:45   CT Angio Abd/Pel w/ and/or w/o Result Date: 01/23/2024 CLINICAL DATA:  thrombus, vs mass; Pulmonary embolism (PE) suspected, high prob. Shortness of breath. Mass versus thrombus. EXAM: CT ANGIOGRAPHY CHEST, ABDOMEN AND PELVIS TECHNIQUE: Non-contrast CT of the chest was initially obtained. Multidetector CT imaging through the chest, abdomen and pelvis was performed using the standard protocol during bolus administration of intravenous contrast. Multiplanar reconstructed images and MIPs were obtained and reviewed to evaluate the vascular anatomy. RADIATION DOSE REDUCTION: This exam was performed according to the departmental dose-optimization program which includes automated exposure control, adjustment of the mA and/or kV according to patient size and/or use of iterative reconstruction technique. CONTRAST:  100mL OMNIPAQUE IOHEXOL 350 MG/ML SOLN COMPARISON:  None Available. FINDINGS: CTA CHEST FINDINGS Cardiovascular: Heart is normal size. Aorta is normal caliber. Prior CABG. Extensive aortic atherosclerosis. No filling defects in the pulmonary arteries to suggest pulmonary emboli. Mediastinum/Nodes: No mediastinal, hilar, or axillary adenopathy. Trachea  and esophagus are unremarkable. Thyroid  unremarkable. Lungs/Pleura: Trace bilateral pleural effusions with dependent/compressive atelectasis in the lower lobes. Musculoskeletal: Chest wall soft tissues are unremarkable. No acute bony abnormality. Review of the MIP images confirms the above findings. CTA ABDOMEN AND PELVIS FINDINGS VASCULAR Aorta: Slight aneurysmal dilatation of the infrarenal abdominal aorta measuring 3 cm maximally.  Diffuse aortic atherosclerosis. No dissection. Celiac: Patent SMA: Patent Renals: Patent IMA: Patent Inflow: Diffuse atherosclerosis. No aneurysm, dissection or significant stenosis. Veins: No obvious venous abnormality within the limitations of this arterial phase study. Review of the MIP images confirms the above findings. NON-VASCULAR Hepatobiliary: No focal hepatic abnormality. Gallbladder unremarkable. Pancreas: No focal abnormality or ductal dilatation. Spleen: No focal abnormality.  Normal size.  With Adrenals/Urinary Tract: Bilateral low-density enlargement of both adrenal glands compatible with hyperplasia or adenomas. No renal mass or hydronephrosis. Urinary bladder unremarkable. Stomach/Bowel: Normal appendix. Sigmoid diverticulosis. No active diverticulitis. Stomach and small bowel decompressed, unremarkable. Lymphatic: No adenopathy Reproductive: No visible focal abnormality. Other: No free fluid or free air. Musculoskeletal: No acute bony abnormality. Degenerative disc and facet disease throughout the lumbar spine. Review of the MIP images confirms the above findings. IMPRESSION: Diffuse aortic atherosclerosis. Slight aneurysmal dilatation of the infrarenal abdominal aorta measuring 3 cm. Recommend follow-up ultrasound every 3 years. (Ref.: J Vasc Surg. 2018; 67:2-77 and J Am Coll Radiol 2013;10(10):789-794.) Trace bilateral pleural effusions. Dependent/compressive atelectasis in the lower lobes. Sigmoid diverticulosis.  No active diverticulitis. No acute findings in the abdomen or pelvis. Electronically Signed   By: Franky Crease M.D.   On: 01/23/2024 12:45   ECHOCARDIOGRAM COMPLETE Result Date: 01/23/2024    ECHOCARDIOGRAM REPORT   Patient Name:   Phillip Dunn Date of Exam: 01/23/2024 Medical Rec #:  [id number]         Height:       66.0 in Accession #:    [id number]        Weight:       236.1 lb Date of Birth:  [birth year]        BSA:          2.146 m Patient Age:    72 years          BP:            107/63 mmHg Patient Gender: M                 HR:           70 bpm. Exam Location:  Inpatient Procedure: 2D Echo, Cardiac Doppler, Color Doppler and Intracardiac            Opacification Agent (Both Spectral and Color Flow Doppler were            utilized during procedure). Indications:    I50.40* Unspecified combined systolic (congestive) and diastolic                 (congestive) heart failure  History:        Patient has prior history of Echocardiogram examinations, most                 recent 12/26/2022. CHF, CAD and Previous Myocardial Infarction,                 Abnormal ECG, Arrythmias:Bradycardia, Signs/Symptoms:Dyspnea and                 Shortness of Breath; Risk Factors:Sleep Apnea, Hypertension,  Dyslipidemia and Former Smoker.  Sonographer:    Ellouise Mose RDCS Referring Phys: [phone] ALMA L DIAZ  Sonographer Comments: Technically difficult study due to poor echo windows and patient is obese. Image acquisition challenging due to patient body habitus and Image acquisition challenging due to respiratory motion. IMPRESSIONS  1. Left ventricular ejection fraction, by estimation, is 45 to 50%. The left ventricle has mildly decreased function. The left ventricle demonstrates regional wall motion abnormalities (see scoring diagram/findings for description). The left ventricular  internal cavity size was mildly dilated. There is mild eccentric left ventricular hypertrophy of the septal segment. Left ventricular diastolic parameters are consistent with Grade I diastolic dysfunction (impaired relaxation). There is abnormal (paradoxical) septal motion, consistent with right ventricular volume overload.  2. Right ventricular systolic function is mildly reduced. The right ventricular size is mildly enlarged. There is moderately elevated pulmonary artery systolic pressure. The estimated right ventricular systolic pressure is 50.0 mmHg.  3. Left atrial size was mildly dilated.  4. Right atrial size  was mildly dilated.  5. The mitral valve is grossly normal. Mild mitral valve regurgitation. No evidence of mitral stenosis.  6. Tricuspid valve regurgitation is moderate.  7. The aortic valve is tricuspid. There is mild calcification of the aortic valve. There is mild thickening of the aortic valve. Aortic valve regurgitation is not visualized. Mild aortic valve stenosis.  8. 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 The inferior vena cava is dilated in size with <50% respiratory variability, suggesting right atrial pressure of 15 mmHg. Comparison(s): Prior images unable to be directly viewed, comparison made by report only. Conclusion(s)/Recommendation(s): Mass seen in IVC--thrombus vs. other etiology, recommend CT imaging to further evaluate. Findings communicated with Dr. Fairy and Dr. Rolan. FINDINGS  Left Ventricle: Left ventricular ejection fraction, by estimation, is 45 to 50%. The left ventricle has mildly decreased function. The left ventricle demonstrates regional wall motion abnormalities. Definity contrast agent was given IV to delineate the left ventricular endocardial borders. The left ventricular internal cavity size was mildly dilated. There is mild eccentric left ventricular hypertrophy of the septal segment. Abnormal (paradoxical) septal motion, consistent with right ventricular volume  overload and abnormal (paradoxical) septal motion, consistent with left bundle branch block. Left ventricular diastolic parameters are consistent with Grade I diastolic dysfunction (impaired relaxation).  LV Wall Scoring: The inferior wall and posterior wall are hypokinetic. The entire anterior wall, antero-lateral wall, entire septum, and entire apex are normal. Right Ventricle: The right ventricular size is mildly enlarged. No increase in right ventricular wall thickness. Right ventricular systolic function is mildly reduced. There is moderately elevated pulmonary artery systolic pressure.  The tricuspid regurgitant velocity is 2.96 m/s, and with an assumed right atrial pressure of 15 mmHg, the estimated right ventricular systolic pressure is 50.0 mmHg. Left [facility]: Left atrial size was mildly dilated. Right [facility]: Right atrial size was mildly dilated. Pericardium: There is no evidence of pericardial effusion. Mitral Valve: The mitral valve is grossly normal. Mild mitral valve regurgitation. No evidence of mitral valve stenosis. Tricuspid Valve: The tricuspid valve is normal in structure. Tricuspid valve regurgitation is moderate . No evidence of tricuspid stenosis. Aortic Valve: The aortic valve is tricuspid. There is mild calcification of the aortic valve. There is mild thickening of the aortic valve. Aortic valve regurgitation is not visualized. Mild aortic stenosis is present. Aortic valve mean gradient measures  9.0 mmHg. Aortic valve peak gradient measures 17.4 mmHg. Aortic valve area, by VTI measures 1.75 cm. Pulmonic  Valve: The pulmonic valve was grossly normal. Pulmonic valve regurgitation is mild to moderate. No evidence of pulmonic stenosis. Aorta: The aortic root and ascending aorta are structurally normal, with no evidence of dilitation. 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 The inferior vena cava is dilated in size with less than 50% respiratory variability, suggesting right atrial pressure of 15 mmHg. IAS/Shunts: The interatrial septum was not well visualized.  LEFT VENTRICLE PLAX 2D LVIDd:         5.50 cm      Diastology LVIDs:         4.00 cm      LV e' medial:    7.51 cm/s LV PW:         1.30 cm      LV E/e' medial:  13.6 LV IVS:        1.30 cm      LV e' lateral:   10.10 cm/s LVOT diam:     2.10 cm      LV E/e' lateral: 10.1 LV SV:         72 LV SV Index:   34 LVOT Area:     3.46 cm LV IVRT:       95 msec  LV Volumes (MOD) LV vol d, MOD A2C: 173.0 ml LV vol d, MOD A4C: 89.7 ml LV vol s, MOD A2C: 110.0 ml LV vol s, MOD A4C: 36.8 ml LV SV MOD A2C:      63.0 ml LV SV MOD A4C:     89.7 ml LV SV MOD BP:      65.0 ml RIGHT VENTRICLE            IVC RV Basal diam:  4.73 cm    IVC diam: 4.00 cm RV S prime:     8.05 cm/s TAPSE (M-mode): 1.4 cm LEFT ATRIUM             Index        RIGHT ATRIUM           Index LA diam:        5.40 cm 2.52 cm/m   RA Area:     21.20 cm LA Vol (A2C):   89.6 ml 41.75 ml/m  RA Volume:   64.60 ml  30.10 ml/m LA Vol (A4C):   49.2 ml 22.93 ml/m LA Biplane Vol: 66.0 ml 30.76 ml/m  AORTIC VALVE AV Area (Vmax):    1.75 cm AV Area (Vmean):   1.69 cm AV Area (VTI):     1.75 cm AV Vmax:           208.33 cm/s AV Vmean:          139.333 cm/s AV VTI:            0.411 m AV Peak Grad:      17.4 mmHg AV Mean Grad:      9.0 mmHg LVOT Vmax:         105.00 cm/s LVOT Vmean:        68.100 cm/s LVOT VTI:          0.208 m LVOT/AV VTI ratio: 0.51  AORTA Ao Root diam: 3.50 cm Ao Asc diam:  3.60 cm MITRAL VALVE                TRICUSPID VALVE MV Area (PHT): 4.94 cm     TR Peak grad:   35.0 mmHg MV Decel Time: 154 msec     TR  Vmax:        296.00 cm/s MV E velocity: 102.50 cm/s MV A velocity: 69.85 cm/s   SHUNTS MV E/A ratio:  1.47         Systemic VTI:  0.21 m                             Systemic Diam: 2.10 cm Shelda Bruckner MD Electronically signed by Shelda Bruckner MD Signature Date/Time: 01/23/2024/10:33:13 AM    Final    US  EKG SITE RITE Result Date: 01/22/2024 If Site Rite image not attached, placement could not be confirmed due to current cardiac rhythm.    Scheduled Meds:  atorvastatin  80 mg Oral Daily   Chlorhexidine Gluconate Cloth  6 each Topical Daily   clopidogrel   75 mg Oral Daily   dapagliflozin propanediol  10 mg Oral Daily   ferrous sulfate  325 mg Oral BID   finasteride   5 mg Oral Daily   fluticasone furoate-vilanterol  1 puff Inhalation Daily   ipratropium  1 spray Each Nare BID   magnesium oxide  400 mg Oral Daily   metolazone  2.5 mg Oral Once   montelukast  10 mg Oral Daily   pantoprazole  40 mg Oral Daily    potassium chloride   60 mEq Oral BID   sodium chloride flush  10-40 mL Intracatheter Q12H   spironolactone  12.5 mg Oral Daily   tamsulosin  0.4 mg Oral Daily   zolpidem  10 mg Oral QHS   Continuous Infusions:  furosemide      heparin 900 Units/hr (01/24/24 0905)     LOS: 2 days    Time spent:    Sigurd Pac, MD [facility]   01/24/2024, 9:59 AM

## 2024-01-24 NOTE — Progress Notes (Signed)
 PHARMACY - ANTICOAGULATION CONSULT NOTE  Pharmacy Consult for heparin  Indication: LV thrombus  Patient Measurements: Height: 5' 6 (167.6 cm) Weight: 112.8 kg (248 lb 10.9 oz) IBW/kg (Calculated) : 63.8  Vital Signs: Temp: 97.7 F (36.5 C) (10/23 0733) Temp Source: Oral (10/23 0733) BP: 102/53 (10/23 1124) Pulse Rate: 69 (10/23 1124)  Labs: Recent Labs    01/21/24 1316 01/22/24 0223 01/23/24 0305 01/23/24 1824 01/23/24 2354 01/24/24 0507 01/24/24 1125  HGB 7.9*  --  7.8*  --   --  7.2*  --   HCT 27.4*  --  27.2*  --   --  25.2*  --   PLT 241  --  213  --   --  185  --   HEPARINUNFRC  --   --   --  <0.10* <0.10*  --  <0.10*  CREATININE 0.85 0.86 0.90  --   --  0.74  --     Estimated Creatinine Clearance: 97 mL/min (by C-G formula based on SCr of 0.74 mg/dL).  Assessment: 88 you male with Echo showing a large IVC thrombus. He is not on anticoagulation at home. Pharmacy consulted to dose IV heparin. He is noted with a recent admission for GIB  Heparin level is undetectable (<0.1), on heparin infusion at 900 units/hr. Hgb 7.2, plt 185. No s/sx of bleeding or infusion issues.    Goal of Therapy:  Heparin level 0.3-0.5 units/ml Monitor platelets by anticoagulation protocol: Yes   Plan:  -Increase heparin infusion to 1100 units/hr -Heparin level in 8 hours and daily wth CBC daily -F/u transition to DOAC  Thank you for allowing pharmacy to participate in this patient's care,  Suzen Sour, PharmD, BCCCP Clinical Pharmacist  Phone: [phone] 01/24/2024 12:12 PM  Please check AMION for all [facility] Pharmacy phone numbers After 10:00 PM, call Main Pharmacy [phone]

## 2024-01-24 NOTE — Plan of Care (Signed)

## 2024-01-24 NOTE — Progress Notes (Signed)
 PHARMACY - ANTICOAGULATION CONSULT NOTE  Pharmacy Consult for heparin Indication: LV thrombus  Labs: Recent Labs     0000 01/22/24 0223 01/23/24 0305 01/23/24 1824 01/23/24 2354 01/24/24 0507 01/24/24 1125 01/24/24 1739 01/24/24 2210  HGB   < >  --  7.8*  --   --  7.2*  --  8.4*  --   HCT  --   --  27.2*  --   --  25.2*  --  29.3*  --   PLT  --   --  213  --   --  185  --  210  --   HEPARINUNFRC  --   --   --    < > <0.10*  --  <0.10*  --  0.10*  CREATININE  --  0.86 0.90  --   --  0.74  --   --   --    < > = values in this interval not displayed.   Assessment: 72yo male subtherapeutic on heparin after rate change; no infusion issues or signs of bleeding per RN.  Goal of Therapy:  Heparin level 0.3-0.5 units/ml   Plan:  Increase heparin infusion by 3 units/kg/hr to 1400 units/hr. Check level in 6 hours.   Marvetta Dauphin, PharmD, BCPS 01/24/2024 11:22 PM

## 2024-01-24 NOTE — Progress Notes (Signed)
 Patient ID: Phillip Dunn, male   DOB: 09-17-1950, 73 y.o.   MRN: [id number]     Advanced Heart Failure Rounding Note  Cardiologist: Lamar Fitch, MD  HF Consulting Cardiologist: Dr. Rolan  Chief Complaint: Acute on chronic CHF  Subjective:    I/Os net negative 2590, good diuresis. Creatinine 0.74.  CVP 13 today.   Heparin gtt ongoing.  Lower extremity venous dopplers with no DVT, CTA chest with no PE.    Objective:   Weight Range: 112.8 kg Body mass index is 40.14 kg/m.   Vital Signs:   Temp:  [97.6 F (36.4 C)-98.2 F (36.8 C)] 97.7 F (36.5 C) (10/23 0733) Pulse Rate:  [56-71] 71 (10/23 0733) Resp:  [13-22] 20 (10/23 0733) BP: (90-118)/(49-82) 93/49 (10/23 0733) SpO2:  [92 %-100 %] 94 % (10/23 0733) Weight:  [112.8 kg] 112.8 kg (10/23 0337) Last BM Date : 01/23/24  Weight change: Filed Weights   01/22/24 0342 01/23/24 0400 01/24/24 0337  Weight: 116.9 kg 107.1 kg 112.8 kg    Intake/Output:   Intake/Output Summary (Last 24 hours) at 01/24/2024 0854 Last data filed at 01/24/2024 0848 Gross per 24 hour  Intake 1350.15 ml  Output 3700 ml  Net -2349.85 ml      Physical Exam    General: NAD Neck: JVP 14 cm, no thyromegaly or thyroid  nodule.  Lungs: Clear to auscultation bilaterally with normal respiratory effort. CV: Nondisplaced PMI.  Heart regular S1/S2, no S3/S4, 1/6 SEM RUSB.  1+ ankle edema.   Abdomen: Soft, nontender, no hepatosplenomegaly, no distention.  Skin: Intact without lesions or rashes.  Neurologic: Alert and oriented x 3.  Psych: Normal affect. Extremities: No clubbing or cyanosis.  HEENT: Normal.   Telemetry   NSR 60s (personally reviewed)  Labs    CBC Recent Labs    01/23/24 0305 01/24/24 0507  WBC 7.0 6.7  HGB 7.8* 7.2*  HCT 27.2* 25.2*  MCV 83.4 83.2  PLT 213 185   Basic Metabolic Panel Recent Labs    89/79/74 1956 01/22/24 0223 01/23/24 0305 01/24/24 0507  NA  --    < > 140 143  K  --    < > 3.9 3.3*   CL  --    < > 105 107  CO2  --    < > 26 29  GLUCOSE  --    < > 111* 83  BUN  --    < > 15 12  CREATININE  --    < > 0.90 0.74  CALCIUM  --    < > 8.9 8.4*  MG 1.9  --  2.0 2.0  PHOS 3.2  --   --   --    < > = values in this interval not displayed.   Liver Function Tests No results for input(s): AST, ALT, ALKPHOS, BILITOT, PROT, ALBUMIN in the last 72 hours. No results for input(s): LIPASE, AMYLASE in the last 72 hours. Cardiac Enzymes No results for input(s): CKTOTAL, CKMB, CKMBINDEX, TROPONINI in the last 72 hours.  BNP: BNP (last 3 results) No results for input(s): BNP in the last 8760 hours.  ProBNP (last 3 results) Recent Labs    11/23/23 0926 01/21/24 1316  PROBNP 624* 750.0*     D-Dimer No results for input(s): DDIMER in the last 72 hours. Hemoglobin A1C No results for input(s): HGBA1C in the last 72 hours. Fasting Lipid Panel No results for input(s): CHOL, HDL, LDLCALC, TRIG, CHOLHDL, LDLDIRECT in the last 72 hours.  Thyroid  Function Tests No results for input(s): TSH, T4TOTAL, T3FREE, THYROIDAB in the last 72 hours.  Invalid input(s): FREET3  Other results:   Imaging    VAS US  LOWER EXTREMITY VENOUS (DVT) Result Date: 01/23/2024  Lower Venous DVT Study Patient Name:  Phillip Dunn  Date of Exam:   01/23/2024 Medical Rec #: [id number]          Accession #:    [id number] Date of Birth: 1950/04/22         Patient Gender: M Patient Age:   73 years Exam Location:  [facility] Procedure:      VAS US  LOWER EXTREMITY VENOUS (DVT) Referring Phys: SIGURD PAC --------------------------------------------------------------------------------  Indications: Edema.  Risk Factors: None identified. Limitations: Body habitus, poor ultrasound/tissue interface and bandages. Comparison Study: No prior studies. Performing Technologist: Cordella Collet RVT  Examination Guidelines: A complete evaluation includes B-mode  imaging, spectral Doppler, color Doppler, and power Doppler as needed of all accessible portions of each vessel. Bilateral testing is considered an integral part of a complete examination. Limited examinations for reoccurring indications may be performed as noted. The reflux portion of the exam is performed with the patient in reverse Trendelenburg.  +---------+---------------+---------+-----------+----------+-------------------+ RIGHT    CompressibilityPhasicitySpontaneityPropertiesThrombus Aging      +---------+---------------+---------+-----------+----------+-------------------+ CFV      Full           Yes      Yes                                      +---------+---------------+---------+-----------+----------+-------------------+ SFJ      Full                                                             +---------+---------------+---------+-----------+----------+-------------------+ FV Prox  Full                                                             +---------+---------------+---------+-----------+----------+-------------------+ FV Mid   Full                                                             +---------+---------------+---------+-----------+----------+-------------------+ FV Distal               Yes      Yes                                      +---------+---------------+---------+-----------+----------+-------------------+ PFV      Full                                                             +---------+---------------+---------+-----------+----------+-------------------+  POP      Full           Yes      Yes                                      +---------+---------------+---------+-----------+----------+-------------------+ PTV                                                   Not well visualized +---------+---------------+---------+-----------+----------+-------------------+ PERO                                                   Not well visualized +---------+---------------+---------+-----------+----------+-------------------+   +---------+---------------+---------+-----------+----------+-------------------+ LEFT     CompressibilityPhasicitySpontaneityPropertiesThrombus Aging      +---------+---------------+---------+-----------+----------+-------------------+ CFV      Full           Yes      Yes                                      +---------+---------------+---------+-----------+----------+-------------------+ SFJ      Full                                                             +---------+---------------+---------+-----------+----------+-------------------+ FV Prox  Full                                                             +---------+---------------+---------+-----------+----------+-------------------+ FV Mid   Full                                                             +---------+---------------+---------+-----------+----------+-------------------+ FV Distal               Yes      Yes                                      +---------+---------------+---------+-----------+----------+-------------------+ PFV      Full                                                             +---------+---------------+---------+-----------+----------+-------------------+ POP      Full           Yes  Yes                                      +---------+---------------+---------+-----------+----------+-------------------+ PTV                                                   Not well visualized +---------+---------------+---------+-----------+----------+-------------------+ PERO                                                  Not well visualized +---------+---------------+---------+-----------+----------+-------------------+     Summary: RIGHT: - There is no evidence of deep vein thrombosis in the lower extremity. However, portions of this examination were limited-  see technologist comments above.  - No cystic structure found in the popliteal fossa.  LEFT: - There is no evidence of deep vein thrombosis in the lower extremity. However, portions of this examination were limited- see technologist comments above.  - No cystic structure found in the popliteal fossa.  *See table(s) above for measurements and observations. Electronically signed by Debby Robertson on 01/23/2024 at 4:44:46 PM.    Final    CT Angio Chest Pulmonary Embolism (PE) W or WO Contrast Result Date: 01/23/2024 CLINICAL DATA:  thrombus, vs mass; Pulmonary embolism (PE) suspected, high prob. Shortness of breath. Mass versus thrombus. EXAM: CT ANGIOGRAPHY CHEST, ABDOMEN AND PELVIS TECHNIQUE: Non-contrast CT of the chest was initially obtained. Multidetector CT imaging through the chest, abdomen and pelvis was performed using the standard protocol during bolus administration of intravenous contrast. Multiplanar reconstructed images and MIPs were obtained and reviewed to evaluate the vascular anatomy. RADIATION DOSE REDUCTION: This exam was performed according to the departmental dose-optimization program which includes automated exposure control, adjustment of the mA and/or kV according to patient size and/or use of iterative reconstruction technique. CONTRAST:  100mL OMNIPAQUE IOHEXOL 350 MG/ML SOLN COMPARISON:  None Available. FINDINGS: CTA CHEST FINDINGS Cardiovascular: Heart is normal size. Aorta is normal caliber. Prior CABG. Extensive aortic atherosclerosis. No filling defects in the pulmonary arteries to suggest pulmonary emboli. Mediastinum/Nodes: No mediastinal, hilar, or axillary adenopathy. Trachea and esophagus are unremarkable. Thyroid  unremarkable. Lungs/Pleura: Trace bilateral pleural effusions with dependent/compressive atelectasis in the lower lobes. Musculoskeletal: Chest wall soft tissues are unremarkable. No acute bony abnormality. Review of the MIP images confirms the above findings. CTA  ABDOMEN AND PELVIS FINDINGS VASCULAR Aorta: Slight aneurysmal dilatation of the infrarenal abdominal aorta measuring 3 cm maximally. Diffuse aortic atherosclerosis. No dissection. Celiac: Patent SMA: Patent Renals: Patent IMA: Patent Inflow: Diffuse atherosclerosis. No aneurysm, dissection or significant stenosis. Veins: No obvious venous abnormality within the limitations of this arterial phase study. Review of the MIP images confirms the above findings. NON-VASCULAR Hepatobiliary: No focal hepatic abnormality. Gallbladder unremarkable. Pancreas: No focal abnormality or ductal dilatation. Spleen: No focal abnormality.  Normal size.  With Adrenals/Urinary Tract: Bilateral low-density enlargement of both adrenal glands compatible with hyperplasia or adenomas. No renal mass or hydronephrosis. Urinary bladder unremarkable. Stomach/Bowel: Normal appendix. Sigmoid diverticulosis. No active diverticulitis. Stomach and small bowel decompressed, unremarkable. Lymphatic: No adenopathy Reproductive: No visible focal abnormality. Other: No free fluid or free air. Musculoskeletal: No acute bony abnormality. Degenerative disc and  facet disease throughout the lumbar spine. Review of the MIP images confirms the above findings. IMPRESSION: Diffuse aortic atherosclerosis. Slight aneurysmal dilatation of the infrarenal abdominal aorta measuring 3 cm. Recommend follow-up ultrasound every 3 years. (Ref.: J Vasc Surg. 2018; 67:2-77 and J Am Coll Radiol 2013;10(10):789-794.) Trace bilateral pleural effusions. Dependent/compressive atelectasis in the lower lobes. Sigmoid diverticulosis.  No active diverticulitis. No acute findings in the abdomen or pelvis. Electronically Signed   By: Franky Crease M.D.   On: 01/23/2024 12:45   CT Angio Abd/Pel w/ and/or w/o Result Date: 01/23/2024 CLINICAL DATA:  thrombus, vs mass; Pulmonary embolism (PE) suspected, high prob. Shortness of breath. Mass versus thrombus. EXAM: CT ANGIOGRAPHY CHEST,  ABDOMEN AND PELVIS TECHNIQUE: Non-contrast CT of the chest was initially obtained. Multidetector CT imaging through the chest, abdomen and pelvis was performed using the standard protocol during bolus administration of intravenous contrast. Multiplanar reconstructed images and MIPs were obtained and reviewed to evaluate the vascular anatomy. RADIATION DOSE REDUCTION: This exam was performed according to the departmental dose-optimization program which includes automated exposure control, adjustment of the mA and/or kV according to patient size and/or use of iterative reconstruction technique. CONTRAST:  100mL OMNIPAQUE IOHEXOL 350 MG/ML SOLN COMPARISON:  None Available. FINDINGS: CTA CHEST FINDINGS Cardiovascular: Heart is normal size. Aorta is normal caliber. Prior CABG. Extensive aortic atherosclerosis. No filling defects in the pulmonary arteries to suggest pulmonary emboli. Mediastinum/Nodes: No mediastinal, hilar, or axillary adenopathy. Trachea and esophagus are unremarkable. Thyroid  unremarkable. Lungs/Pleura: Trace bilateral pleural effusions with dependent/compressive atelectasis in the lower lobes. Musculoskeletal: Chest wall soft tissues are unremarkable. No acute bony abnormality. Review of the MIP images confirms the above findings. CTA ABDOMEN AND PELVIS FINDINGS VASCULAR Aorta: Slight aneurysmal dilatation of the infrarenal abdominal aorta measuring 3 cm maximally. Diffuse aortic atherosclerosis. No dissection. Celiac: Patent SMA: Patent Renals: Patent IMA: Patent Inflow: Diffuse atherosclerosis. No aneurysm, dissection or significant stenosis. Veins: No obvious venous abnormality within the limitations of this arterial phase study. Review of the MIP images confirms the above findings. NON-VASCULAR Hepatobiliary: No focal hepatic abnormality. Gallbladder unremarkable. Pancreas: No focal abnormality or ductal dilatation. Spleen: No focal abnormality.  Normal size.  With Adrenals/Urinary Tract:  Bilateral low-density enlargement of both adrenal glands compatible with hyperplasia or adenomas. No renal mass or hydronephrosis. Urinary bladder unremarkable. Stomach/Bowel: Normal appendix. Sigmoid diverticulosis. No active diverticulitis. Stomach and small bowel decompressed, unremarkable. Lymphatic: No adenopathy Reproductive: No visible focal abnormality. Other: No free fluid or free air. Musculoskeletal: No acute bony abnormality. Degenerative disc and facet disease throughout the lumbar spine. Review of the MIP images confirms the above findings. IMPRESSION: Diffuse aortic atherosclerosis. Slight aneurysmal dilatation of the infrarenal abdominal aorta measuring 3 cm. Recommend follow-up ultrasound every 3 years. (Ref.: J Vasc Surg. 2018; 67:2-77 and J Am Coll Radiol 2013;10(10):789-794.) Trace bilateral pleural effusions. Dependent/compressive atelectasis in the lower lobes. Sigmoid diverticulosis.  No active diverticulitis. No acute findings in the abdomen or pelvis. Electronically Signed   By: Franky Crease M.D.   On: 01/23/2024 12:45   ECHOCARDIOGRAM COMPLETE Result Date: 01/23/2024    ECHOCARDIOGRAM REPORT   Patient Name:   CAYSON KALB Date of Exam: 01/23/2024 Medical Rec #:  [id number]         Height:       66.0 in Accession #:    [id number]        Weight:       236.1 lb Date of Birth:  January 06, 1951  BSA:          2.146 m Patient Age:    72 years          BP:           107/63 mmHg Patient Gender: M                 HR:           70 bpm. Exam Location:  Inpatient Procedure: 2D Echo, Cardiac Doppler, Color Doppler and Intracardiac            Opacification Agent (Both Spectral and Color Flow Doppler were            utilized during procedure). Indications:    I50.40* Unspecified combined systolic (congestive) and diastolic                 (congestive) heart failure  History:        Patient has prior history of Echocardiogram examinations, most                 recent 12/26/2022. CHF, CAD and  Previous Myocardial Infarction,                 Abnormal ECG, Arrythmias:Bradycardia, Signs/Symptoms:Dyspnea and                 Shortness of Breath; Risk Factors:Sleep Apnea, Hypertension,                 Dyslipidemia and Former Smoker.  Sonographer:    Ellouise Mose RDCS Referring Phys: [phone] ALMA L DIAZ  Sonographer Comments: Technically difficult study due to poor echo windows and patient is obese. Image acquisition challenging due to patient body habitus and Image acquisition challenging due to respiratory motion. IMPRESSIONS  1. Left ventricular ejection fraction, by estimation, is 45 to 50%. The left ventricle has mildly decreased function. The left ventricle demonstrates regional wall motion abnormalities (see scoring diagram/findings for description). The left ventricular  internal cavity size was mildly dilated. There is mild eccentric left ventricular hypertrophy of the septal segment. Left ventricular diastolic parameters are consistent with Grade I diastolic dysfunction (impaired relaxation). There is abnormal (paradoxical) septal motion, consistent with right ventricular volume overload.  2. Right ventricular systolic function is mildly reduced. The right ventricular size is mildly enlarged. There is moderately elevated pulmonary artery systolic pressure. The estimated right ventricular systolic pressure is 50.0 mmHg.  3. Left atrial size was mildly dilated.  4. Right atrial size was mildly dilated.  5. The mitral valve is grossly normal. Mild mitral valve regurgitation. No evidence of mitral stenosis.  6. Tricuspid valve regurgitation is moderate.  7. The aortic valve is tricuspid. There is mild calcification of the aortic valve. There is mild thickening of the aortic valve. Aortic valve regurgitation is not visualized. Mild aortic valve stenosis.  8. 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 The inferior vena cava is dilated in size with <50% respiratory variability, suggesting  right atrial pressure of 15 mmHg. Comparison(s): Prior images unable to be directly viewed, comparison made by report only. Conclusion(s)/Recommendation(s): Mass seen in IVC--thrombus vs. other etiology, recommend CT imaging to further evaluate. Findings communicated with Dr. Fairy and Dr. Rolan. FINDINGS  Left Ventricle: Left ventricular ejection fraction, by estimation, is 45 to 50%. The left ventricle has mildly decreased function. The left ventricle demonstrates regional wall motion abnormalities. Definity contrast agent was given IV to delineate the left ventricular endocardial borders. The left ventricular internal cavity size  was mildly dilated. There is mild eccentric left ventricular hypertrophy of the septal segment. Abnormal (paradoxical) septal motion, consistent with right ventricular volume  overload and abnormal (paradoxical) septal motion, consistent with left bundle branch block. Left ventricular diastolic parameters are consistent with Grade I diastolic dysfunction (impaired relaxation).  LV Wall Scoring: The inferior wall and posterior wall are hypokinetic. The entire anterior wall, antero-lateral wall, entire septum, and entire apex are normal. Right Ventricle: The right ventricular size is mildly enlarged. No increase in right ventricular wall thickness. Right ventricular systolic function is mildly reduced. There is moderately elevated pulmonary artery systolic pressure. The tricuspid regurgitant velocity is 2.96 m/s, and with an assumed right atrial pressure of 15 mmHg, the estimated right ventricular systolic pressure is 50.0 mmHg. Left Atrium: Left atrial size was mildly dilated. Right Atrium: Right atrial size was mildly dilated. Pericardium: There is no evidence of pericardial effusion. Mitral Valve: The mitral valve is grossly normal. Mild mitral valve regurgitation. No evidence of mitral valve stenosis. Tricuspid Valve: The tricuspid valve is normal in structure. Tricuspid valve  regurgitation is moderate . No evidence of tricuspid stenosis. Aortic Valve: The aortic valve is tricuspid. There is mild calcification of the aortic valve. There is mild thickening of the aortic valve. Aortic valve regurgitation is not visualized. Mild aortic stenosis is present. Aortic valve mean gradient measures  9.0 mmHg. Aortic valve peak gradient measures 17.4 mmHg. Aortic valve area, by VTI measures 1.75 cm. Pulmonic Valve: The pulmonic valve was grossly normal. Pulmonic valve regurgitation is mild to moderate. No evidence of pulmonic stenosis. Aorta: The aortic root and ascending aorta are structurally normal, with no evidence of dilitation. 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 The inferior vena cava is dilated in size with less than 50% respiratory variability, suggesting right atrial pressure of 15 mmHg. IAS/Shunts: The interatrial septum was not well visualized.  LEFT VENTRICLE PLAX 2D LVIDd:         5.50 cm      Diastology LVIDs:         4.00 cm      LV e' medial:    7.51 cm/s LV PW:         1.30 cm      LV E/e' medial:  13.6 LV IVS:        1.30 cm      LV e' lateral:   10.10 cm/s LVOT diam:     2.10 cm      LV E/e' lateral: 10.1 LV SV:         72 LV SV Index:   34 LVOT Area:     3.46 cm LV IVRT:       95 msec  LV Volumes (MOD) LV vol d, MOD A2C: 173.0 ml LV vol d, MOD A4C: 89.7 ml LV vol s, MOD A2C: 110.0 ml LV vol s, MOD A4C: 36.8 ml LV SV MOD A2C:     63.0 ml LV SV MOD A4C:     89.7 ml LV SV MOD BP:      65.0 ml RIGHT VENTRICLE            IVC RV Basal diam:  4.73 cm    IVC diam: 4.00 cm RV S prime:     8.05 cm/s TAPSE (M-mode): 1.4 cm LEFT ATRIUM             Index        RIGHT ATRIUM  Index LA diam:        5.40 cm 2.52 cm/m   RA Area:     21.20 cm LA Vol (A2C):   89.6 ml 41.75 ml/m  RA Volume:   64.60 ml  30.10 ml/m LA Vol (A4C):   49.2 ml 22.93 ml/m LA Biplane Vol: 66.0 ml 30.76 ml/m  AORTIC VALVE AV Area (Vmax):    1.75 cm AV Area (Vmean):   1.69  cm AV Area (VTI):     1.75 cm AV Vmax:           208.33 cm/s AV Vmean:          139.333 cm/s AV VTI:            0.411 m AV Peak Grad:      17.4 mmHg AV Mean Grad:      9.0 mmHg LVOT Vmax:         105.00 cm/s LVOT Vmean:        68.100 cm/s LVOT VTI:          0.208 m LVOT/AV VTI ratio: 0.51  AORTA Ao Root diam: 3.50 cm Ao Asc diam:  3.60 cm MITRAL VALVE                TRICUSPID VALVE MV Area (PHT): 4.94 cm     TR Peak grad:   35.0 mmHg MV Decel Time: 154 msec     TR Vmax:        296.00 cm/s MV E velocity: 102.50 cm/s MV A velocity: 69.85 cm/s   SHUNTS MV E/A ratio:  1.47         Systemic VTI:  0.21 m                             Systemic Diam: 2.10 cm Shelda Bruckner MD Electronically signed by Shelda Bruckner MD Signature Date/Time: 01/23/2024/10:33:13 AM    Final      Medications:     Scheduled Medications:  atorvastatin  80 mg Oral Daily   Chlorhexidine Gluconate Cloth  6 each Topical Daily   clopidogrel   75 mg Oral Daily   dapagliflozin propanediol  10 mg Oral Daily   ferrous sulfate  325 mg Oral BID   finasteride   5 mg Oral Daily   fluticasone furoate-vilanterol  1 puff Inhalation Daily   ipratropium  1 spray Each Nare BID   magnesium oxide  400 mg Oral Daily   metolazone  2.5 mg Oral Once   montelukast  10 mg Oral Daily   pantoprazole  40 mg Oral Daily   potassium chloride   40 mEq Oral BID   sodium chloride flush  10-40 mL Intracatheter Q12H   tamsulosin  0.4 mg Oral Daily   zolpidem  10 mg Oral QHS    Infusions:  furosemide      heparin 900 Units/hr (01/24/24 0056)    PRN Medications: albuterol , sodium chloride, sodium chloride flush    Patient Profile   Abe Schools is a 73 y.o. male with chronic combined systolic and diastolic HF, CAD s/p CABG, chronic hypoxic resp failure on 4L, HTN, HLD, obesity, OSA on CPAP, chronic leg swelling, and GIB. AHF team to see with A/C HFpEF and hypotension.     Assessment/Plan   A/C HFmrEF: Ischemic cardiomyopathy.   - Echo 9/24: EF 55-60%. RV normal, trivial MR, mild AV stenosis, AV mean gradient 12 mmHg.  - Echo 6/25 [facility]): EF 45-50%, basal  to mid inferior and inferolateral AK, mild RV dilatation with okay function, RVSP 57 mmhg - See last LHC in #2.  - Echo this admission with EF 45-50%, inferior/inferolateral hypokinesis, mild LV dilation/mild LVH, mild RV enlargement/mild RV dilation, moderate TR.  - Suspect component of ICM w/ inferior/inferolateral WMAs.  - NYHA IV on admission.  - Massively volume overloaded on exam initially. Exam difficult as he has hx chronic BLE edema.  - Good diuresis yesterday, weight down.  CVP still 13.  Lasix  120 mg IV bid + metolazone 2.5 x 1 today.  Will replace K, pm BMET.  - Start spironolactone 12.5 daily.  - Continue farxiga 10 mg daily - continue UNNA boots  - GDMT will be limited by soft BP.    CAD s/p CABG and multiple PCI - Coronary CT 8/19: severe 3V CAD, occluded prox RCA, mid LAD/ patent LIMA to mid LAD, mid and distal LAD. LCx long stented area, unable to determine patency.  - LHC ([facility]) 01/2019 multivessel CAD, acute OM1 occlusion, known CTO of RCA and SVG to PDA, known patent LIMA to LAD graft, mild LV dysfunction. Unsuccessful PCI of OM1 due to thrombus burden within the known OM1 aneurysmal segment. guidewire would not fully traverse the occluded segment which thrombosed after brief interruption of DAPT 5 years after prior OM intervention. Medically managed.  - HsTrop flat, no ACS.  - No chest pain.  - Currently on Plavix .  If he will need long-term anticoagulation, would stop Plavix  given concern for GI bleeding with anemia.     HTN> hypotension - off losartan  as above - Add back as able once diuresed.    Recent GIB - Scope 9/25 with duodenal ulcers and barretts esophagus.  - Hgb 7s. Has denied overt bleeding - Transfuse Hgb < 7 - Iron stores are low with T sat 11%. Gave IV iron. - Hemoccult - PPI - Off ASA.  Would stop Plavix  if  requires long-term anticoagulation.    Venous insufficiency Chronic leg swelling - UNNA boots   COPD Chronic hypoxic respiratory failure - on home 4L   OSA on CPAP - Continue CPAP  8.  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  Venous dopplers negative for DVT and CTA chest negative for PE. CT abdomen/pelvis with no renal mass or evidence for renal cell carcinoma.   - Pending MRA/MRV chest to assess for IVC thrombus.  - Continue heparin gtt for now.     Length of Stay: 2  Ezra Shuck, MD  01/24/2024, 8:54 AM  Advanced Heart Failure Team Pager [phone] (M-F; 7a - 5p)  Please contact [facility] for night-coverage after hours (5p -7a ) and weekends on [email or web address]

## 2024-01-24 NOTE — Progress Notes (Signed)
 PHARMACY - ANTICOAGULATION CONSULT NOTE  Pharmacy Consult for heparin  Indication: LV thrombus  Patient Measurements: Height: 5' 6 (167.6 cm) Weight: 107.1 kg (236 lb 1.6 oz) IBW/kg (Calculated) : 63.8  Vital Signs: Temp: 98.1 F (36.7 C) (10/22 2338) Temp Source: Axillary (10/22 2338) BP: 95/68 (10/22 2338) Pulse Rate: 66 (10/22 2338)  Labs: Recent Labs    01/21/24 1316 01/22/24 0223 01/23/24 0305 01/23/24 1824 01/23/24 2354  HGB 7.9*  --  7.8*  --   --   HCT 27.4*  --  27.2*  --   --   PLT 241  --  213  --   --   HEPARINUNFRC  --   --   --  <0.10* <0.10*  CREATININE 0.85 0.86 0.90  --   --     Estimated Creatinine Clearance: 83.9 mL/min (by C-G formula based on SCr of 0.9 mg/dL).  Assessment: 20 you male with Echo showing a large IVC thrombus. He is not on anticoagulation at home. Pharmacy consulted to dose IV heparin. He is noted with a recent admission for GIB  -AM: heparin level undetectable on 800 units/hr. Last CBC showed Hgb= 7.8, plt= 213. Per RN, no issues with the heparin gtt running continuously or signs/symptoms of bleeding.   Goal of Therapy:  Heparin level 0.3-0.5 units/ml Monitor platelets by anticoagulation protocol: Yes   Plan:  -no heparin bolus with low hg and hx GIB  -Increase heparin to 900 units/hr -Heparin level in 8 hours and daily wth CBC daily -F/u transition to DOAC  Lynwood Poplar, PharmD, BCPS Clinical Pharmacist 01/24/2024 12:39 AM

## 2024-01-25 DIAGNOSIS — I5023 Acute on chronic systolic (congestive) heart failure: Secondary | ICD-10-CM | POA: Diagnosis not present

## 2024-01-25 LAB — CBC
HCT: 26.9 % — ABNORMAL LOW (ref 39.0–52.0)
Hemoglobin: 7.9 g/dL — ABNORMAL LOW (ref 13.0–17.0)
MCH: 24 pg — ABNORMAL LOW (ref 26.0–34.0)
MCHC: 29.4 g/dL — ABNORMAL LOW (ref 30.0–36.0)
MCV: 81.8 fL (ref 80.0–100.0)
Platelets: 202 K/uL (ref 150–400)
RBC: 3.29 MIL/uL — ABNORMAL LOW (ref 4.22–5.81)
RDW: 22 % — ABNORMAL HIGH (ref 11.5–15.5)
WBC: 7.3 K/uL (ref 4.0–10.5)
nRBC: 0.3 % — ABNORMAL HIGH (ref 0.0–0.2)

## 2024-01-25 LAB — BASIC METABOLIC PANEL WITH GFR
Anion gap: 13 (ref 5–15)
BUN: 15 mg/dL (ref 8–23)
CO2: 32 mmol/L (ref 22–32)
Calcium: 9.7 mg/dL (ref 8.9–10.3)
Chloride: 94 mmol/L — ABNORMAL LOW (ref 98–111)
Creatinine, Ser: 1.04 mg/dL (ref 0.61–1.24)
GFR, Estimated: >60 mL/min (ref >60)
Glucose, Bld: 101 mg/dL — ABNORMAL HIGH (ref 70–99)
Potassium: 3.6 mmol/L (ref 3.5–5.1)
Sodium: 139 mmol/L (ref 135–145)

## 2024-01-25 LAB — HEPARIN LEVEL (UNFRACTIONATED)
Heparin Unfractionated: 0.22 [IU]/mL — ABNORMAL LOW (ref 0.30–0.70)
Heparin Unfractionated: 0.42 [IU]/mL (ref 0.30–0.70)

## 2024-01-25 MED ORDER — TORSEMIDE 20 MG PO TABS
60.0000 mg | ORAL_TABLET | Freq: Every day | ORAL | Status: DC
  Administered 2024-01-25 – 2024-01-26 (×2): 60 mg via ORAL
  Filled 2024-01-25 (×2): qty 3

## 2024-01-25 MED ORDER — SPIRONOLACTONE 25 MG PO TABS
25.0000 mg | ORAL_TABLET | Freq: Every day | ORAL | Status: DC
  Administered 2024-01-25 – 2024-01-26 (×2): 25 mg via ORAL
  Filled 2024-01-25 (×2): qty 1

## 2024-01-25 MED ORDER — POTASSIUM CHLORIDE CRYS ER 20 MEQ PO TBCR
40.0000 meq | EXTENDED_RELEASE_TABLET | Freq: Once | ORAL | Status: AC
  Administered 2024-01-25: 40 meq via ORAL
  Filled 2024-01-25: qty 2

## 2024-01-25 NOTE — Progress Notes (Signed)
 Unna boots off and legs washed ortho tech contacted and said they would come up this evening to put the unna boots back on. SCD encouraged but pt. Refuses at this time pt. educated on importance of use.

## 2024-01-25 NOTE — Progress Notes (Signed)
 Patient ID: Phillip Dunn, male   DOB: [birth year], 73 y.o.   MRN: [id number]     Advanced Heart Failure Rounding Note  Cardiologist: Lamar Fitch, MD  HF Consulting Cardiologist: Dr. Rolan  Chief Complaint: Acute on chronic CHF  Subjective:    I/Os net negative 7543, good diuresis with decreased weight. Creatinine 1.04.  CVP 7-8 today.   Heparin gtt ongoing for ?IVC thrombus.  Lower extremity venous dopplers with no DVT, CTA chest with no PE.  Awaiting read on MRV/MRA chest.   Breathing better.   Hgb 8.4 => 7.9, no overt bleeding.    Objective:   Weight Range: 105.9 kg Body mass index is 37.67 kg/m.   Vital Signs:   Temp:  [97.3 F (36.3 C)-98.3 F (36.8 C)] 98.1 F (36.7 C) (10/24 0425) Pulse Rate:  [65-71] 65 (10/24 0425) Resp:  [20] 20 (10/23 1124) BP: (93-131)/(49-69) 131/67 (10/24 0425) SpO2:  [94 %-100 %] 100 % (10/23 2341) FiO2 (%):  [32 %] 32 % (10/23 2214) Weight:  [105.9 kg] 105.9 kg (10/24 0425) Last BM Date : 01/24/24  Weight change: Filed Weights   01/23/24 0400 01/24/24 0337 01/25/24 0425  Weight: 107.1 kg 112.8 kg 105.9 kg    Intake/Output:   Intake/Output Summary (Last 24 hours) at 01/25/2024 0732 Last data filed at 01/25/2024 9375 Gross per 24 hour  Intake 1057.04 ml  Output 8600 ml  Net -7542.96 ml      Physical Exam    General: NAD Neck: No JVD, no thyromegaly or thyroid  nodule.  Lungs: Clear to auscultation bilaterally with normal respiratory effort. CV: Nondisplaced PMI.  Heart regular S1/S2, no S3/S4, no murmur.  Trace ankle edema.  Abdomen: Soft, nontender, no hepatosplenomegaly, no distention.  Skin: Intact without lesions or rashes.  Neurologic: Alert and oriented x 3.  Psych: Normal affect. Extremities: No clubbing or cyanosis.  HEENT: Normal.   Telemetry   NSR 60s (personally reviewed)  Labs    CBC Recent Labs    01/24/24 1739 01/25/24 0531  WBC 9.0 7.3  HGB 8.4* 7.9*  HCT 29.3* 26.9*  MCV 83.2 81.8   PLT 210 202   Basic Metabolic Panel Recent Labs    89/77/74 0305 01/24/24 0507 01/25/24 0531  NA 140 143 139  K 3.9 3.3* 3.6  CL 105 107 94*  CO2 26 29 32  GLUCOSE 111* 83 101*  BUN 15 12 15   CREATININE 0.90 0.74 1.04  CALCIUM 8.9 8.4* 9.7  MG 2.0 2.0  --    Liver Function Tests No results for input(s): AST, ALT, ALKPHOS, BILITOT, PROT, ALBUMIN in the last 72 hours. No results for input(s): LIPASE, AMYLASE in the last 72 hours. Cardiac Enzymes No results for input(s): CKTOTAL, CKMB, CKMBINDEX, TROPONINI in the last 72 hours.  BNP: BNP (last 3 results) No results for input(s): BNP in the last 8760 hours.  ProBNP (last 3 results) Recent Labs    11/23/23 0926 01/21/24 1316  PROBNP 624* 750.0*     D-Dimer No results for input(s): DDIMER in the last 72 hours. Hemoglobin A1C No results for input(s): HGBA1C in the last 72 hours. Fasting Lipid Panel No results for input(s): CHOL, HDL, LDLCALC, TRIG, CHOLHDL, LDLDIRECT in the last 72 hours. Thyroid  Function Tests No results for input(s): TSH, T4TOTAL, T3FREE, THYROIDAB in the last 72 hours.  Invalid input(s): FREET3  Other results:   Imaging    No results found.    Medications:     Scheduled Medications:  atorvastatin  80 mg Oral Daily   Chlorhexidine Gluconate Cloth  6 each Topical Daily   clopidogrel   75 mg Oral Daily   dapagliflozin propanediol  10 mg Oral Daily   ferrous sulfate  325 mg Oral BID   finasteride   5 mg Oral Daily   fluticasone furoate-vilanterol  1 puff Inhalation Daily   ipratropium  1 spray Each Nare BID   lidocaine  2 patch Transdermal Q24H   magnesium oxide  400 mg Oral Daily   montelukast  10 mg Oral Daily   pantoprazole  40 mg Oral Daily   potassium chloride   40 mEq Oral Once   sodium chloride flush  10-40 mL Intracatheter Q12H   spironolactone  25 mg Oral Daily   tamsulosin  0.4 mg Oral Daily   torsemide  60 mg Oral Daily    zolpidem  10 mg Oral QHS    Infusions:  heparin 1,600 Units/hr (01/25/24 0624)    PRN Medications: albuterol , sodium chloride, sodium chloride flush    Patient Profile   Phillip Dunn is a 73 y.o. male with chronic combined systolic and diastolic HF, CAD s/p CABG, chronic hypoxic resp failure on 4L, HTN, HLD, obesity, OSA on CPAP, chronic leg swelling, and GIB. AHF team to see with A/C HFpEF and hypotension.     Assessment/Plan   A/C HFmrEF: Ischemic cardiomyopathy.  - Echo 9/24: EF 55-60%. RV normal, trivial MR, mild AV stenosis, AV mean gradient 12 mmHg.  - Echo 6/25 [facility]): EF 45-50%, basal to mid inferior and inferolateral AK, mild RV dilatation with okay function, RVSP 57 mmhg - See last LHC in #2.  - Echo this admission with EF 45-50%, inferior/inferolateral hypokinesis, mild LV dilation/mild LVH, mild RV enlargement/mild RV dilation, moderate TR.  - Suspect component of ICM w/ inferior/inferolateral WMAs.  - NYHA IV on admission.  - Massively volume overloaded on exam initially. Exam difficult as he has hx chronic BLE edema.  - Good diuresis yesterday, weight down again.  CVP down to 7-8.  Transition to torsemide 60 mg daily today.  - Increase spironolactone to 25 mg daily.  - Continue farxiga 10 mg daily - GDMT will be limited by soft BP.  May be able to add back low dose losartan  tomorrow.    CAD s/p CABG and multiple PCI - Coronary CT 8/19: severe 3V CAD, occluded prox RCA, mid LAD/ patent LIMA to mid LAD, mid and distal LAD. LCx long stented area, unable to determine patency.  - LHC ([facility]) 01/2019 multivessel CAD, acute OM1 occlusion, known CTO of RCA and SVG to PDA, known patent LIMA to LAD graft, mild LV dysfunction. Unsuccessful PCI of OM1 due to thrombus burden within the known OM1 aneurysmal segment. guidewire would not fully traverse the occluded segment which thrombosed after brief interruption of DAPT 5 years after prior OM intervention. Medically  managed.  - HsTrop flat, no ACS.  - No chest pain.  - Currently on Plavix .  If he will need long-term anticoagulation, would stop Plavix  given concern for GI bleeding with anemia.     HTN> hypotension - off losartan  as above - Add back as able once diuresed.    Recent GIB - Scope 9/25 with duodenal ulcers and barretts esophagus.  - Hgb 7s. Has denied overt bleeding - Transfuse Hgb < 7 - Iron stores are low with T sat 11%. Gave IV iron. - Hemoccult - PPI - Off ASA.  Would stop Plavix  if requires long-term anticoagulation.  Venous insufficiency Chronic leg swelling - UNNA boots   COPD Chronic hypoxic respiratory failure - on home 4L   OSA on CPAP - Continue CPAP  8.  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  Venous dopplers negative for DVT and CTA chest negative for PE. CT abdomen/pelvis with no renal mass or evidence for renal cell carcinoma.   - Pending MRA/MRV chest read (study done) to assess for IVC thrombus => hopefully can make decision on anticoagulation when read is back.  - Continue heparin gtt for now.     Length of Stay: 3  Ezra Shuck, MD  01/25/2024, 7:32 AM  Advanced Heart Failure Team Pager [phone] (M-F; 7a - 5p)  Please contact [facility] for night-coverage after hours (5p -7a ) and weekends on [email or web address]

## 2024-01-25 NOTE — Progress Notes (Signed)
 PHARMACY - ANTICOAGULATION CONSULT NOTE  Pharmacy Consult for heparin Indication: LV thrombus  Labs: Recent Labs    01/23/24 0305 01/23/24 1824 01/24/24 0507 01/24/24 1125 01/24/24 1739 01/24/24 2210 01/25/24 0531  HGB 7.8*  --  7.2*  --  8.4*  --  7.9*  HCT 27.2*  --  25.2*  --  29.3*  --  26.9*  PLT 213  --  185  --  210  --  202  HEPARINUNFRC  --    < >  --  <0.10*  --  0.10* 0.22*  CREATININE 0.90  --  0.74  --   --   --  1.04   < > = values in this interval not displayed.   Assessment: 72yo male remains subtherapeutic on heparin after rate change but approaching goal; no infusion issues or signs of bleeding per RN.  Goal of Therapy:  Heparin level 0.3-0.5 units/ml   Plan:  Increase heparin infusion by 2 units/kg/hr to 1600 units/hr. Check level in 6 hours.   Marvetta Dauphin, PharmD, BCPS 01/25/2024 6:21 AM

## 2024-01-25 NOTE — Progress Notes (Signed)
 Orthopedic Tech Progress Note Patient Details:  Phillip Dunn 05/19/1950 [id number]  Ortho Devices Type of Ortho Device: Radio broadcast assistant Ortho Device/Splint Location: BLE Ortho Device/Splint Interventions: Ordered, Application, Adjustment   Post Interventions Patient Tolerated: Well Instructions Provided: Care of device  Phillip Dunn 01/25/2024, 8:53 PM

## 2024-01-25 NOTE — Progress Notes (Signed)
 Mobility Specialist Progress Note:    01/25/24 1047  Mobility  Activity Ambulated with assistance  Level of Assistance Standby assist, set-up cues, supervision of patient - no hands on  Assistive Device Front wheel walker  Distance Ambulated (ft) 220 ft  Activity Response Tolerated well  Mobility Referral Yes  Mobility visit 1 Mobility  Mobility Specialist Start Time (ACUTE ONLY) 1047  Mobility Specialist Stop Time (ACUTE ONLY) 1112  Mobility Specialist Time Calculation (min) (ACUTE ONLY) 25 min   Received pt sitting in recliner agreeable to session. No c/o any symptoms. Pt moving and ambulating well. Returned pt to recliner w/ all needs met.   Venetia Keel Mobility Specialist Please Neurosurgeon or Rehab Office at [phone]

## 2024-01-25 NOTE — Progress Notes (Signed)
   01/25/24 2105  BiPAP/CPAP/SIPAP  BiPAP/CPAP/SIPAP Pt Type Adult  BiPAP/CPAP/SIPAP Resmed  Mask Type Full face mask  Mask Size Small  IPAP 10 cmH20 (max 10)  EPAP 5 cmH2O (min 5)  Flow Rate 4 lpm  Patient Home Machine No  Patient Home Mask No  Patient Home Tubing No  Auto Titrate Yes  BiPAP/CPAP /SiPAP Vitals  Bilateral Breath Sounds Clear;Diminished

## 2024-01-25 NOTE — TOC Transition Note (Signed)
 Transition of Care [facility]) - Discharge Note   Patient Details  Name: Phillip Dunn MRN: [id number] Date of Birth: 1950-12-30  [facility] of [facility] CM/SW Contact:  Arlana JINNY Nicholaus [place] Phone Number: [phone] 01/25/2024, 11:58 AM   Clinical Narrative:  HF CSW called to schedule patients hospital follow up appointment for  Tuesday, October 28,2025 at 11:30 AM.   Wife will provide transportation at dc.           Patient Goals and CMS Choice            Discharge Placement                       Discharge Plan and Services Additional resources added to the After Visit Summary for                                       Social Drivers of Health (SDOH) Interventions SDOH Screenings   Food Insecurity: No Food Insecurity (01/21/2024)  Housing: Low Risk  (01/21/2024)  Transportation Needs: No Transportation Needs (01/21/2024)  Utilities: Not At Risk (01/21/2024)  Social Connections: Moderately Isolated (01/21/2024)  Tobacco Use: Medium Risk (01/21/2024)     Readmission Risk Interventions     No data to display

## 2024-01-25 NOTE — Progress Notes (Signed)
 PROGRESS NOTE    Phillip Dunn  FMW:[id number] DOB: Jan 31, 1951 DOA: 01/21/2024 PCP: Keren Vicenta BRAVO, MD  73/M w chronic combined CHF, chronic hypoxic respiratory failure on 4 L [place], obesity, T2DM, HLD, HTN, MI/CAD s/p CABG and stenting, lumbar radiculopathy, OSA on CPAP, COPD, anemia, venous insufficiency, chronic leg swelling, and a recent hospitalization for GI bleed at outside hospital presented to the ED with progressive shortness of breath with intermittent chest tightness. [facility] ED Course:VSS, labs significant for Hgb 7.9, normal renal function, proBNP 750, troponin 36-36, mag 1.9, Phos 3.2. CXR shows mild interstitial edema and trace bilateral pleural effusions,  - Admitted, started on diuretics - 10/21, CHF team consulting - 10/22, echo with concern for large IVC thrombus, CTA and Dopplers negative  Subjective: - Diuresis yesterday, swelling down considerably  Assessment and Plan:  Acute on chronic systolic and diastolic heart failure - Echo this admit with EF 45-50%, inferior lateral hypokinesis, moderate TR  - CHF team following, diuresed aggressively, 11.8 L negative, weight down 23 LB, now switching to oral torsemide today - Continue Farxiga, Aldactone -Continue Unna boots, increase activity - Discharge planning  ?Large IVC thrombus -Noted on echo,?  Artifact -CTA chest negative for PE, Dopplers negative for DVT - MRA/venogram completed yesterday, results pending -Remains on IV heparin for now  CAD/CABG, PCI -LHC 10/20 with multivessel CAD -On medical management, currently on Plavix    Normocytic anemia Hx of duodenal ulcer - recently admitted to [facility] on 9/26 for GI bleed and acute CHF and discharged on 10/3 - An EGD on 9/28 revealed a Barrett's esophagus and duodenal ulcer with oozing hemorrhage treated with 2 clips - sp 4 units PRBC, hb relatively stable now  - Given IV iron, monitor hemoglobin, relatively stable now,   stop Plavix  if he needs anticoagulation - Follow-up with [facility] at discharge   T2DM - CBGs are stable, diet controlled   Venous insufficiency -Chronic leg swelling - diuretics as above   COPD/Chronic hypoxic respiratory failure - Remains on home 4 L [place], no wheezing or cough - Continue Singulair and Breo Ellipta - As needed DuoNeb   CAD s/p CABG and stenting - Continue Plavix  and aspirin   HLD - Continue atorvastatin   OSA - Continue CPAP at night   BPH - Continue Flomax and finasteride    # Class III obesity Body mass index is 41.43 kg/m.     DVT prophylaxis: SCDs Code Status: full Family Communication: wife at bedside yesterday Disposition Plan: Home tomorrow if stable  Consultants:    Procedures:   Antimicrobials:    Objective: Vitals:   01/24/24 2329 01/24/24 2341 01/25/24 0425 01/25/24 0727  BP:  (!) 101/59 131/67 115/65  Pulse:   65   Resp:    20  Temp: (!) 97.3 F (36.3 C) (!) 97.3 F (36.3 C) 98.1 F (36.7 C) 98 F (36.7 C)  TempSrc:  Axillary Axillary Oral  SpO2:  100%  100%  Weight:   105.9 kg   Height:        Intake/Output Summary (Last 24 hours) at 01/25/2024 1014 Last data filed at 01/25/2024 0624 Gross per 24 hour  Intake 777 ml  Output 8600 ml  Net -7823 ml   Filed Weights   01/23/24 0400 01/24/24 0337 01/25/24 0425  Weight: 107.1 kg 112.8 kg 105.9 kg    Examination:  Gen: Awake, Alert, Oriented X 3,  HEENT: obese, cannot assess JVD Lungs: Creased breath sounds at  the bases otherwise clear CVS: S1S2/RRR Abd: soft, Non tender, non distended, BS present Extremities: Trace edema, Unna boots on Skin: no new rashes on exposed skin    Data Reviewed:   CBC: Recent Labs  Lab 01/21/24 1316 01/23/24 0305 01/24/24 0507 01/24/24 1739 01/25/24 0531  WBC 8.1 7.0 6.7 9.0 7.3  HGB 7.9* 7.8* 7.2* 8.4* 7.9*  HCT 27.4* 27.2* 25.2* 29.3* 26.9*  MCV 83.3 83.4 83.2 83.2 81.8  PLT 241 213 185 210 202   Basic Metabolic  Panel: Recent Labs  Lab 01/21/24 1316 01/21/24 1956 01/22/24 0223 01/23/24 0305 01/24/24 0507 01/25/24 0531  NA 142  --  140 140 143 139  K 3.9  --  3.5 3.9 3.3* 3.6  CL 104  --  104 105 107 94*  CO2 26  --  28 26 29  32  GLUCOSE 102*  --  96 111* 83 101*  BUN 17  --  15 15 12 15   CREATININE 0.85  --  0.86 0.90 0.74 1.04  CALCIUM 9.5  --  8.5* 8.9 8.4* 9.7  MG  --  1.9  --  2.0 2.0  --   PHOS  --  3.2  --   --   --   --    GFR: Estimated Creatinine Clearance: 72.1 mL/min (by C-G formula based on SCr of 1.04 mg/dL). Liver Function Tests: No results for input(s): AST, ALT, ALKPHOS, BILITOT, PROT, ALBUMIN in the last 168 hours. No results for input(s): LIPASE, AMYLASE in the last 168 hours. No results for input(s): AMMONIA in the last 168 hours. Coagulation Profile: No results for input(s): INR, PROTIME in the last 168 hours. Cardiac Enzymes: No results for input(s): CKTOTAL, CKMB, CKMBINDEX, TROPONINI in the last 168 hours. BNP (last 3 results) Recent Labs    11/23/23 0926 01/21/24 1316  PROBNP 624* 750.0*   HbA1C: No results for input(s): HGBA1C in the last 72 hours. CBG: No results for input(s): GLUCAP in the last 168 hours. Lipid Profile: No results for input(s): CHOL, HDL, LDLCALC, TRIG, CHOLHDL, LDLDIRECT in the last 72 hours. Thyroid  Function Tests: No results for input(s): TSH, T4TOTAL, FREET4, T3FREE, THYROIDAB in the last 72 hours. Anemia Panel: Recent Labs    01/23/24 0305  VITAMINB12 806  FOLATE >20.0  FERRITIN 10*  TIBC 482*  IRON 51  RETICCTPCT 4.1*   Urine analysis: No results found for: COLORURINE, APPEARANCEUR, LABSPEC, PHURINE, GLUCOSEU, HGBUR, BILIRUBINUR, KETONESUR, PROTEINUR, UROBILINOGEN, NITRITE, LEUKOCYTESUR Sepsis Labs: @LABRCNTIP (procalcitonin:4,lacticidven:4)  )No results found for this or any previous visit (from the past 240 hours).   Radiology  Studies: MR ANGIO CHEST W WO CONTRAST Result Date: 01/25/2024 CLINICAL DATA:  IVC thrombus visualized by echocardiography. EXAM: MRA CHEST WITH OR WITHOUT CONTRAST TECHNIQUE: Angiographic images of the chest were obtained using MRA technique with intravenous contrast. CONTRAST:  10mL GADAVIST GADOBUTROL 1 MMOL/ML IV SOLN COMPARISON:  Echocardiography report 01/23/2024, CTA chest, abdomen and pelvis 01/23/2024 FINDINGS: VASCULAR Aorta: Normally patent thoracic aorta demonstrating normal caliber and no evidence of dissection. Visualized proximal great vessels demonstrate normal patency and normal branching anatomy. Heart: Top-normal heart size. Dilated left atrium. Somewhat thickened appearance of the intraventricular septum. No pericardial fluid identified. Pulmonary Arteries: Normal caliber central pulmonary arteries. No obvious pulmonary arterial filling defects. SVC/IVC: The SVC and visualized IVC demonstrate normal patency by MRA. The IVC is visualized through the intrahepatic segment and to just below the liver. No evidence IVC thrombus, stenosis or mass. The hepatic veins appear normally patent.  NON-VASCULAR Small bilateral pleural effusions with bibasilar atelectasis. No incidental lymphadenopathy or obvious pulmonary masses. Visualized upper abdomen is unremarkable. No visualized bony abnormalities. IMPRESSION: 1. Normal MRA of the chest. No evidence of IVC thrombus, stenosis or mass. 2. Dilated left atrium. 3. Somewhat thickened appearance of the intraventricular septum. 4. Small bilateral pleural effusions with bibasilar atelectasis. Electronically Signed   By: Marcey Moan M.D.   On: 01/25/2024 09:21   VAS US  LOWER EXTREMITY VENOUS (DVT) Result Date: 01/23/2024  Lower Venous DVT Study Patient Name:  Phillip Dunn  Date of Exam:   01/23/2024 Medical Rec #: [id number]          Accession #:    [id number] Date of Birth: 04-06-1950         Patient Gender: M Patient Age:   47 years Exam Location:   [facility] Procedure:      VAS US  LOWER EXTREMITY VENOUS (DVT) Referring Phys: SIGURD PAC --------------------------------------------------------------------------------  Indications: Edema.  Risk Factors: None identified. Limitations: Body habitus, poor ultrasound/tissue interface and bandages. Comparison Study: No prior studies. Performing Technologist: Cordella Collet RVT  Examination Guidelines: A complete evaluation includes B-mode imaging, spectral Doppler, color Doppler, and power Doppler as needed of all accessible portions of each vessel. Bilateral testing is considered an integral part of a complete examination. Limited examinations for reoccurring indications may be performed as noted. The reflux portion of the exam is performed with the patient in reverse Trendelenburg.  +---------+---------------+---------+-----------+----------+-------------------+ RIGHT    CompressibilityPhasicitySpontaneityPropertiesThrombus Aging      +---------+---------------+---------+-----------+----------+-------------------+ CFV      Full           Yes      Yes                                      +---------+---------------+---------+-----------+----------+-------------------+ SFJ      Full                                                             +---------+---------------+---------+-----------+----------+-------------------+ FV Prox  Full                                                             +---------+---------------+---------+-----------+----------+-------------------+ FV Mid   Full                                                             +---------+---------------+---------+-----------+----------+-------------------+ FV Distal               Yes      Yes                                      +---------+---------------+---------+-----------+----------+-------------------+ PFV      Full                                                              +---------+---------------+---------+-----------+----------+-------------------+  POP      Full           Yes      Yes                                      +---------+---------------+---------+-----------+----------+-------------------+ PTV                                                   Not well visualized +---------+---------------+---------+-----------+----------+-------------------+ PERO                                                  Not well visualized +---------+---------------+---------+-----------+----------+-------------------+   +---------+---------------+---------+-----------+----------+-------------------+ LEFT     CompressibilityPhasicitySpontaneityPropertiesThrombus Aging      +---------+---------------+---------+-----------+----------+-------------------+ CFV      Full           Yes      Yes                                      +---------+---------------+---------+-----------+----------+-------------------+ SFJ      Full                                                             +---------+---------------+---------+-----------+----------+-------------------+ FV Prox  Full                                                             +---------+---------------+---------+-----------+----------+-------------------+ FV Mid   Full                                                             +---------+---------------+---------+-----------+----------+-------------------+ FV Distal               Yes      Yes                                      +---------+---------------+---------+-----------+----------+-------------------+ PFV      Full                                                             +---------+---------------+---------+-----------+----------+-------------------+ POP      Full           Yes  Yes                                      +---------+---------------+---------+-----------+----------+-------------------+  PTV                                                   Not well visualized +---------+---------------+---------+-----------+----------+-------------------+ PERO                                                  Not well visualized +---------+---------------+---------+-----------+----------+-------------------+     Summary: RIGHT: - There is no evidence of deep vein thrombosis in the lower extremity. However, portions of this examination were limited- see technologist comments above.  - No cystic structure found in the popliteal fossa.  LEFT: - There is no evidence of deep vein thrombosis in the lower extremity. However, portions of this examination were limited- see technologist comments above.  - No cystic structure found in the popliteal fossa.  *See table(s) above for measurements and observations. Electronically signed by Debby Robertson on 01/23/2024 at 4:44:46 PM.    Final    CT Angio Chest Pulmonary Embolism (PE) W or WO Contrast Result Date: 01/23/2024 CLINICAL DATA:  thrombus, vs mass; Pulmonary embolism (PE) suspected, high prob. Shortness of breath. Mass versus thrombus. EXAM: CT ANGIOGRAPHY CHEST, ABDOMEN AND PELVIS TECHNIQUE: Non-contrast CT of the chest was initially obtained. Multidetector CT imaging through the chest, abdomen and pelvis was performed using the standard protocol during bolus administration of intravenous contrast. Multiplanar reconstructed images and MIPs were obtained and reviewed to evaluate the vascular anatomy. RADIATION DOSE REDUCTION: This exam was performed according to the departmental dose-optimization program which includes automated exposure control, adjustment of the mA and/or kV according to patient size and/or use of iterative reconstruction technique. CONTRAST:  100mL OMNIPAQUE IOHEXOL 350 MG/ML SOLN COMPARISON:  None Available. FINDINGS: CTA CHEST FINDINGS Cardiovascular: Heart is normal size. Aorta is normal caliber. Prior CABG. Extensive aortic  atherosclerosis. No filling defects in the pulmonary arteries to suggest pulmonary emboli. Mediastinum/Nodes: No mediastinal, hilar, or axillary adenopathy. Trachea and esophagus are unremarkable. Thyroid  unremarkable. Lungs/Pleura: Trace bilateral pleural effusions with dependent/compressive atelectasis in the lower lobes. Musculoskeletal: Chest wall soft tissues are unremarkable. No acute bony abnormality. Review of the MIP images confirms the above findings. CTA ABDOMEN AND PELVIS FINDINGS VASCULAR Aorta: Slight aneurysmal dilatation of the infrarenal abdominal aorta measuring 3 cm maximally. Diffuse aortic atherosclerosis. No dissection. Celiac: Patent SMA: Patent Renals: Patent IMA: Patent Inflow: Diffuse atherosclerosis. No aneurysm, dissection or significant stenosis. Veins: No obvious venous abnormality within the limitations of this arterial phase study. Review of the MIP images confirms the above findings. NON-VASCULAR Hepatobiliary: No focal hepatic abnormality. Gallbladder unremarkable. Pancreas: No focal abnormality or ductal dilatation. Spleen: No focal abnormality.  Normal size.  With Adrenals/Urinary Tract: Bilateral low-density enlargement of both adrenal glands compatible with hyperplasia or adenomas. No renal mass or hydronephrosis. Urinary bladder unremarkable. Stomach/Bowel: Normal appendix. Sigmoid diverticulosis. No active diverticulitis. Stomach and small bowel decompressed, unremarkable. Lymphatic: No adenopathy Reproductive: No visible focal abnormality. Other: No free fluid or free air. Musculoskeletal: No acute bony abnormality. Degenerative disc and  facet disease throughout the lumbar spine. Review of the MIP images confirms the above findings. IMPRESSION: Diffuse aortic atherosclerosis. Slight aneurysmal dilatation of the infrarenal abdominal aorta measuring 3 cm. Recommend follow-up ultrasound every 3 years. (Ref.: J Vasc Surg. 2018; 67:2-77 and J Am Coll Radiol 2013;10(10):789-794.)  Trace bilateral pleural effusions. Dependent/compressive atelectasis in the lower lobes. Sigmoid diverticulosis.  No active diverticulitis. No acute findings in the abdomen or pelvis. Electronically Signed   By: Franky Crease M.D.   On: 01/23/2024 12:45   CT Angio Abd/Pel w/ and/or w/o Result Date: 01/23/2024 CLINICAL DATA:  thrombus, vs mass; Pulmonary embolism (PE) suspected, high prob. Shortness of breath. Mass versus thrombus. EXAM: CT ANGIOGRAPHY CHEST, ABDOMEN AND PELVIS TECHNIQUE: Non-contrast CT of the chest was initially obtained. Multidetector CT imaging through the chest, abdomen and pelvis was performed using the standard protocol during bolus administration of intravenous contrast. Multiplanar reconstructed images and MIPs were obtained and reviewed to evaluate the vascular anatomy. RADIATION DOSE REDUCTION: This exam was performed according to the departmental dose-optimization program which includes automated exposure control, adjustment of the mA and/or kV according to patient size and/or use of iterative reconstruction technique. CONTRAST:  100mL OMNIPAQUE IOHEXOL 350 MG/ML SOLN COMPARISON:  None Available. FINDINGS: CTA CHEST FINDINGS Cardiovascular: Heart is normal size. Aorta is normal caliber. Prior CABG. Extensive aortic atherosclerosis. No filling defects in the pulmonary arteries to suggest pulmonary emboli. Mediastinum/Nodes: No mediastinal, hilar, or axillary adenopathy. Trachea and esophagus are unremarkable. Thyroid  unremarkable. Lungs/Pleura: Trace bilateral pleural effusions with dependent/compressive atelectasis in the lower lobes. Musculoskeletal: Chest wall soft tissues are unremarkable. No acute bony abnormality. Review of the MIP images confirms the above findings. CTA ABDOMEN AND PELVIS FINDINGS VASCULAR Aorta: Slight aneurysmal dilatation of the infrarenal abdominal aorta measuring 3 cm maximally. Diffuse aortic atherosclerosis. No dissection. Celiac: Patent SMA: Patent  Renals: Patent IMA: Patent Inflow: Diffuse atherosclerosis. No aneurysm, dissection or significant stenosis. Veins: No obvious venous abnormality within the limitations of this arterial phase study. Review of the MIP images confirms the above findings. NON-VASCULAR Hepatobiliary: No focal hepatic abnormality. Gallbladder unremarkable. Pancreas: No focal abnormality or ductal dilatation. Spleen: No focal abnormality.  Normal size.  With Adrenals/Urinary Tract: Bilateral low-density enlargement of both adrenal glands compatible with hyperplasia or adenomas. No renal mass or hydronephrosis. Urinary bladder unremarkable. Stomach/Bowel: Normal appendix. Sigmoid diverticulosis. No active diverticulitis. Stomach and small bowel decompressed, unremarkable. Lymphatic: No adenopathy Reproductive: No visible focal abnormality. Other: No free fluid or free air. Musculoskeletal: No acute bony abnormality. Degenerative disc and facet disease throughout the lumbar spine. Review of the MIP images confirms the above findings. IMPRESSION: Diffuse aortic atherosclerosis. Slight aneurysmal dilatation of the infrarenal abdominal aorta measuring 3 cm. Recommend follow-up ultrasound every 3 years. (Ref.: J Vasc Surg. 2018; 67:2-77 and J Am Coll Radiol 2013;10(10):789-794.) Trace bilateral pleural effusions. Dependent/compressive atelectasis in the lower lobes. Sigmoid diverticulosis.  No active diverticulitis. No acute findings in the abdomen or pelvis. Electronically Signed   By: Franky Crease M.D.   On: 01/23/2024 12:45     Scheduled Meds:  atorvastatin  80 mg Oral Daily   Chlorhexidine Gluconate Cloth  6 each Topical Daily   clopidogrel   75 mg Oral Daily   dapagliflozin propanediol  10 mg Oral Daily   ferrous sulfate  325 mg Oral BID   finasteride   5 mg Oral Daily   fluticasone furoate-vilanterol  1 puff Inhalation Daily   ipratropium  1 spray Each Nare BID   lidocaine  2 patch Transdermal Q24H   magnesium oxide  400 mg  Oral Daily   montelukast  10 mg Oral Daily   pantoprazole  40 mg Oral Daily   sodium chloride flush  10-40 mL Intracatheter Q12H   spironolactone  25 mg Oral Daily   tamsulosin  0.4 mg Oral Daily   torsemide  60 mg Oral Daily   zolpidem  10 mg Oral QHS   Continuous Infusions:  heparin 1,600 Units/hr (01/25/24 0624)     LOS: 3 days    Time spent:    Sigurd Pac, MD [facility]   01/25/2024, 10:14 AM

## 2024-01-25 NOTE — Progress Notes (Signed)
 PHARMACY - ANTICOAGULATION CONSULT NOTE  Pharmacy Consult for heparin  Indication: IVC thrombus  Patient Measurements: Height: 5' 6 (167.6 cm) Weight: 105.9 kg (233 lb 6.4 oz) IBW/kg (Calculated) : 63.8  Vital Signs: Temp: 98 F (36.7 C) (10/24 1048) Temp Source: Oral (10/24 1048) BP: 105/58 (10/24 1048) Pulse Rate: 65 (10/24 0425)  Labs: Recent Labs    01/23/24 0305 01/23/24 1824 01/24/24 0507 01/24/24 1125 01/24/24 1739 01/24/24 2210 01/25/24 0531 01/25/24 1313  HGB 7.8*  --  7.2*  --  8.4*  --  7.9*  --   HCT 27.2*  --  25.2*  --  29.3*  --  26.9*  --   PLT 213  --  185  --  210  --  202  --   HEPARINUNFRC  --    < >  --    < >  --  0.10* 0.22* 0.42  CREATININE 0.90  --  0.74  --   --   --  1.04  --    < > = values in this interval not displayed.    Estimated Creatinine Clearance: 72.1 mL/min (by C-G formula based on SCr of 1.04 mg/dL).  Assessment: 32 you male with Echo showing a large IVC thrombus. He is not on anticoagulation at home. Pharmacy consulted to dose IV heparin. He is noted with a recent admission for GIB  Heparin level is therapeutic at 0.42, on heparin infusion at 1600 units/hr. Hgb 7.9, plt 202. No s/sx of bleeding or infusion issues. MRA read negative for IVC thrombus, stenosis, or mass.   Goal of Therapy:  Heparin level 0.3-0.5 units/ml Monitor platelets by anticoagulation protocol: Yes   Plan:  -Given no IVC thrombus discussed with team - can stop heparin infusion   Thank you for allowing pharmacy to participate in this patient's care,  Suzen Sour, PharmD, BCCCP Clinical Pharmacist  Phone: [phone] 01/25/2024 1:48 PM  Please check AMION for all [facility] Pharmacy phone numbers After 10:00 PM, call Main Pharmacy [phone]

## 2024-01-25 NOTE — Progress Notes (Signed)
 MRA of the chest reviewed with Dr. Rolan. No evidence of IVC thrombus or mass. Okay to stop heparin. Will continue plavix .

## 2024-01-25 NOTE — Plan of Care (Signed)
   Problem: Education: Goal: Knowledge of General Education information will improve Description Including pain rating scale, medication(s)/side effects and non-pharmacologic comfort measures Outcome: Progressing   Problem: Clinical Measurements: Goal: Diagnostic test results will improve Outcome: Progressing Goal: Respiratory complications will improve Outcome: Progressing

## 2024-01-25 NOTE — Plan of Care (Signed)
  Problem: Education: Goal: Knowledge of General Education information will improve Description: Including pain rating scale, medication(s)/side effects and non-pharmacologic comfort measures Outcome: Progressing   Problem: Clinical Measurements: Goal: Will remain free from infection Outcome: Progressing   Problem: Nutrition: Goal: Adequate nutrition will be maintained Outcome: Progressing   Problem: Pain Managment: Goal: General experience of comfort will improve and/or be controlled Outcome: Progressing   Problem: Activity: Goal: Capacity to carry out activities will improve Outcome: Progressing

## 2024-01-26 ENCOUNTER — Other Ambulatory Visit (HOSPITAL_COMMUNITY): Payer: Self-pay

## 2024-01-26 DIAGNOSIS — I5023 Acute on chronic systolic (congestive) heart failure: Secondary | ICD-10-CM | POA: Diagnosis not present

## 2024-01-26 LAB — BASIC METABOLIC PANEL WITH GFR
Anion gap: 11 (ref 5–15)
BUN: 19 mg/dL (ref 8–23)
CO2: 32 mmol/L (ref 22–32)
Calcium: 9.8 mg/dL (ref 8.9–10.3)
Chloride: 96 mmol/L — ABNORMAL LOW (ref 98–111)
Creatinine, Ser: 1.09 mg/dL (ref 0.61–1.24)
GFR, Estimated: >60 mL/min (ref >60)
Glucose, Bld: 100 mg/dL — ABNORMAL HIGH (ref 70–99)
Potassium: 3.5 mmol/L (ref 3.5–5.1)
Sodium: 139 mmol/L (ref 135–145)

## 2024-01-26 LAB — CBC
HCT: 29.6 % — ABNORMAL LOW (ref 39.0–52.0)
Hemoglobin: 8.6 g/dL — ABNORMAL LOW (ref 13.0–17.0)
MCH: 24.3 pg — ABNORMAL LOW (ref 26.0–34.0)
MCHC: 29.1 g/dL — ABNORMAL LOW (ref 30.0–36.0)
MCV: 83.6 fL (ref 80.0–100.0)
Platelets: 208 K/uL (ref 150–400)
RBC: 3.54 MIL/uL — ABNORMAL LOW (ref 4.22–5.81)
RDW: 22.5 % — ABNORMAL HIGH (ref 11.5–15.5)
WBC: 7.9 K/uL (ref 4.0–10.5)
nRBC: 0 % (ref 0.0–0.2)

## 2024-01-26 MED ORDER — SPIRONOLACTONE 25 MG PO TABS
25.0000 mg | ORAL_TABLET | Freq: Every day | ORAL | 1 refills | Status: DC
  Filled 2024-01-26: qty 30, 30d supply, fill #0

## 2024-01-26 MED ORDER — DAPAGLIFLOZIN PROPANEDIOL 10 MG PO TABS
10.0000 mg | ORAL_TABLET | Freq: Every day | ORAL | 1 refills | Status: DC
  Filled 2024-01-26: qty 30, 30d supply, fill #0

## 2024-01-26 MED ORDER — POTASSIUM CHLORIDE CRYS ER 20 MEQ PO TBCR
40.0000 meq | EXTENDED_RELEASE_TABLET | Freq: Every day | ORAL | 1 refills | Status: DC
  Filled 2024-01-26: qty 60, 30d supply, fill #0

## 2024-01-26 MED ORDER — TORSEMIDE 20 MG PO TABS
60.0000 mg | ORAL_TABLET | Freq: Every day | ORAL | 1 refills | Status: DC
  Filled 2024-01-26: qty 90, 30d supply, fill #0

## 2024-01-26 NOTE — Progress Notes (Signed)
 Unna boots removed and legs cleaned pt. Is discharging 01/26/24

## 2024-01-26 NOTE — Plan of Care (Signed)
  Problem: Clinical Measurements: Goal: Will remain free from infection Outcome: Progressing Goal: Respiratory complications will improve Outcome: Progressing Goal: Cardiovascular complication will be avoided Outcome: Progressing   Problem: Activity: Goal: Risk for activity intolerance will decrease Outcome: Progressing   Problem: Safety: Goal: Ability to remain free from injury will improve Outcome: Progressing   Problem: Cardiac: Goal: Ability to achieve and maintain adequate cardiopulmonary perfusion will improve Outcome: Progressing

## 2024-01-26 NOTE — Progress Notes (Signed)
 CVAD removed per protocol per MD order. Manual pressure applied for 3 mins. Vaseline gauze, gauze, and Tegaderm applied over insertion site. No bleeding or swelling noted. Instructed patient to remain in bed for thirty mins. Educated patient about S/S of infection and when to call MD; no heavy lifting or pressure on R side for 24 hours; keep dressing dry and intact for 24 hours. Pt verbalized comprehension.

## 2024-01-26 NOTE — Discharge Summary (Signed)
 Physician Discharge Summary  Phillip Dunn FMW:[id number] DOB: 1950/11/15 DOA: 01/21/2024  PCP: Keren Vicenta BRAVO, MD  Admit date: 01/21/2024 Discharge date: 01/26/2024  Time spent: 45 minutes  Recommendations for Outpatient Follow-up:  CHF TOC clinic on 11/7, please check BMP at follow-up   Discharge Diagnoses:  Principal Problem:   Acute on chronic systolic CHF (congestive heart failure) (HCC) Active Problems:   Essential hypertension   CAD S/P percutaneous coronary angioplasty   Chronic obstructive pulmonary disease (HCC)   History of duodenal ulcer   OSA on CPAP   Benign prostatic hyperplasia   Chronic venous insufficiency   Obesity, Class III, BMI 40-49.9 (morbid obesity) (HCC)   Discharge Condition: Improved  Diet recommendation: Low-sodium, heart healthy  Filed Weights   01/24/24 0337 01/25/24 0425 01/26/24 0500  Weight: 112.8 kg 105.9 kg 105.1 kg    History of present illness:  72/M w chronic combined CHF, chronic hypoxic respiratory failure on 4 L [place], obesity, T2DM, HLD, HTN, MI/CAD s/p CABG and stenting, lumbar radiculopathy, OSA on CPAP, COPD, anemia, venous insufficiency, chronic leg swelling, and a recent hospitalization for GI bleed at outside hospital presented to the ED with progressive shortness of breath with intermittent chest tightness. [facility] ED Course:VSS, labs significant for Hgb 7.9, normal renal function, proBNP 750, troponin 36-36, mag 1.9, Phos 3.2. CXR shows mild interstitial edema and trace bilateral pleural effusions,   Hospital Course:   Acute on chronic systolic and diastolic heart failure - Echo this admit with EF 45-50%, inferior lateral hypokinesis, moderate TR  - CHF team following, diuresed aggressively, 14 L negative, weight down 24LB, now switched to oral torsemide  - Continue Farxiga, Aldactone - Volume status has improved, discharged home in a stable condition, has follow-up in CHF TOC clinic on 11/7   Abnormal echo, concern  for IVC thrombus - This was an artifact -CTA chest negative for PE, Dopplers negative for DVT - MRA/venogram completed yesterday, negative for IVC thrombus or mass, heparin discontinued yesterday   CAD/CABG, PCI -LHC 10/20 with multivessel CAD -On medical management, currently on Plavix    Normocytic anemia Hx of duodenal ulcer - recently admitted to [facility] on 9/26 for GI bleed and acute CHF and discharged on 10/3, EGD on 9/28 revealed a Barrett's esophagus and duodenal ulcer with oozing hemorrhage treated with 2 clips,  sp 4 units PRBC,  - Given IV iron, hb relatively stable now, Plavix  continued - Follow-up with [facility] at discharge   T2DM - CBGs are stable, diet controlled   Venous insufficiency -Chronic leg swelling - diuretics as above   COPD/Chronic hypoxic respiratory failure - Remains on home 4 L [place], no wheezing or cough - Continue Singulair and Breo Ellipta - As needed DuoNeb   CAD s/p CABG and stenting - Continue Plavix  and aspirin   HLD - Continue atorvastatin   OSA - Continue CPAP at night   BPH - Continue Flomax and finasteride   Infrarenal abdominal aortic aneurysm Needs follow-up   # Class III obesity Body mass index is 41.43 kg/m.  Discharge Exam: Vitals:   01/26/24 0500 01/26/24 0740  BP: 91/62 95/60  Pulse: 78 60  Resp: 19 20  Temp: 97.6 F (36.4 C) 97.6 F (36.4 C)  SpO2: 93% 96%   Gen: Awake, Alert, Oriented X 3,  HEENT: no JVD Lungs: Good air movement bilaterally, CTAB CVS: S1S2/RRR Abd: soft, Non tender, non distended, BS present Extremities: Trace edema Skin: no new rashes on  exposed skin   Discharge Instructions   Discharge Instructions     Diet - low sodium heart healthy   Complete by: As directed    Increase activity slowly   Complete by: As directed       Allergies as of 01/26/2024       Reactions   Grass Pollen(k-o-r-t-swt Vern) Other (See Comments)   Other Other (See  Comments)   Pollen   Pollen Extract         Medication List     STOP taking these medications    furosemide  40 MG tablet Commonly known as: LASIX    losartan  25 MG tablet Commonly known as: COZAAR        TAKE these medications    albuterol  108 (90 Base) MCG/ACT inhaler Commonly known as: VENTOLIN  HFA Inhale 2 puffs into the lungs every 4 (four) hours as needed for wheezing.   aspirin EC 81 MG tablet Take 81 mg by mouth daily.   atorvastatin 80 MG tablet Commonly known as: LIPITOR Take 80 mg by mouth daily.   clopidogrel  75 MG tablet Commonly known as: PLAVIX  TAKE 1 TABLET EVERY DAY   CoQ-10 10 MG Caps Take 10 mg by mouth daily.   Farxiga 10 MG Tabs tablet Generic drug: dapagliflozin propanediol Take 1 tablet (10 mg total) by mouth daily.   ferrous sulfate 325 (65 FE) MG EC tablet Take 325 mg by mouth 2 (two) times daily.   finasteride  5 MG tablet Commonly known as: PROSCAR  Take 1 tablet (5 mg total) by mouth daily.   IPRATROPIUM BROMIDE IN Place 1 spray into both nostrils 2 (two) times daily.   KRILL OIL PO Take 1,000 mg by mouth daily.   MAGnesium-Oxide 400 (240 Mg) MG tablet Generic drug: magnesium oxide Take 1 tablet by mouth daily.   montelukast 10 MG tablet Commonly known as: SINGULAIR Take 10 mg by mouth daily.   omeprazole 40 MG capsule Commonly known as: PRILOSEC Take 40 mg by mouth 2 (two) times daily.   OVER THE COUNTER MEDICATION Take 1 tablet by mouth daily. Tylenol ES   potassium chloride  SA 20 MEQ tablet Commonly known as: KLOR-CON  M Take 2 tablets (40 mEq total) by mouth daily.   spironolactone 25 MG tablet Commonly known as: ALDACTONE Take 1 tablet (25 mg total) by mouth daily.   Symbicort 160-4.5 MCG/ACT inhaler Generic drug: budesonide-formoterol Inhale 2 puffs into the lungs 2 (two) times daily.   tamsulosin 0.4 MG Caps capsule Commonly known as: FLOMAX Take 0.4 mg by mouth daily.   torsemide 20 MG  tablet Commonly known as: DEMADEX Take 3 tablets (60 mg total) by mouth daily.   zolpidem 10 MG tablet Commonly known as: AMBIEN Take 10 mg by mouth at bedtime.       Allergies  Allergen Reactions   Grass Pollen(K-O-R-T-Swt Vern) Other (See Comments)   Other Other (See Comments)    Pollen    Pollen Extract     Follow-up Information     Keren Vicenta BRAVO, MD. Go in 1 day(s).   Specialty: Internal Medicine Why: Hospital follow up appointment for  Tuesday, October 28,2025 at 11:30 AM.  PLEASE ARRIVE 10-15 minutes early for appointment.  PLEASE call to cancel/reschedule appointment if you CANNOT make it. Contact information: [address] [place] [address] [phone]         [facility] Follow up on 02/08/2024.   Specialty: Cardiology Why: Advanced  Heart Failure Clinic 2:30 PM Entrance C Contact information: [address] [place]  [phone]                 The results of significant diagnostics from this hospitalization (including imaging, microbiology, ancillary and laboratory) are listed below for reference.    Significant Diagnostic Studies: MR ANGIO CHEST W WO CONTRAST Result Date: 01/25/2024 CLINICAL DATA:  IVC thrombus visualized by echocardiography. EXAM: MRA CHEST WITH OR WITHOUT CONTRAST TECHNIQUE: Angiographic images of the chest were obtained using MRA technique with intravenous contrast. CONTRAST:  10mL GADAVIST GADOBUTROL 1 MMOL/ML IV SOLN COMPARISON:  Echocardiography report 01/23/2024, CTA chest, abdomen and pelvis 01/23/2024 FINDINGS: VASCULAR Aorta: Normally patent thoracic aorta demonstrating normal caliber and no evidence of dissection. Visualized proximal great vessels demonstrate normal patency and normal branching anatomy. Heart: Top-normal heart size. Dilated left atrium. Somewhat thickened appearance of the intraventricular septum. No  pericardial fluid identified. Pulmonary Arteries: Normal caliber central pulmonary arteries. No obvious pulmonary arterial filling defects. SVC/IVC: The SVC and visualized IVC demonstrate normal patency by MRA. The IVC is visualized through the intrahepatic segment and to just below the liver. No evidence IVC thrombus, stenosis or mass. The hepatic veins appear normally patent. NON-VASCULAR Small bilateral pleural effusions with bibasilar atelectasis. No incidental lymphadenopathy or obvious pulmonary masses. Visualized upper abdomen is unremarkable. No visualized bony abnormalities. IMPRESSION: 1. Normal MRA of the chest. No evidence of IVC thrombus, stenosis or mass. 2. Dilated left atrium. 3. Somewhat thickened appearance of the intraventricular septum. 4. Small bilateral pleural effusions with bibasilar atelectasis. Electronically Signed   By: Marcey Moan M.D.   On: 01/25/2024 09:21   VAS US  LOWER EXTREMITY VENOUS (DVT) Result Date: 01/23/2024  Lower Venous DVT Study Patient Name:  LUGENE BEOUGHER  Date of Exam:   01/23/2024 Medical Rec #: [id number]          Accession #:    [id number] Date of Birth: 1950-08-16         Patient Gender: M Patient Age:   28 years Exam Location[facility]  [facility] Procedure:      VAS US  LOWER EXTREMITY VENOUS (DVT) Referring Phys: SIGURD PAC --------------------------------------------------------------------------------  Indications: Edema.  Risk Factors: None identified. Limitations: Body habitus, poor ultrasound/tissue interface and bandages. Comparison Study: No prior studies. Performing Technologist: Cordella Collet RVT  Examination Guidelines: A complete evaluation includes B-mode imaging, spectral Doppler, color Doppler, and power Doppler as needed of all accessible portions of each vessel. Bilateral testing is considered an integral part of a complete examination. Limited examinations for reoccurring indications may be performed as noted. The reflux portion of  the exam is performed with the patient in reverse Trendelenburg.  +---------+---------------+---------+-----------+----------+-------------------+ RIGHT    CompressibilityPhasicitySpontaneityPropertiesThrombus Aging      +---------+---------------+---------+-----------+----------+-------------------+ CFV      Full           Yes      Yes                                      +---------+---------------+---------+-----------+----------+-------------------+ SFJ      Full                                                             +---------+---------------+---------+-----------+----------+-------------------+  FV Prox  Full                                                             +---------+---------------+---------+-----------+----------+-------------------+ FV Mid   Full                                                             +---------+---------------+---------+-----------+----------+-------------------+ FV Distal               Yes      Yes                                      +---------+---------------+---------+-----------+----------+-------------------+ PFV      Full                                                             +---------+---------------+---------+-----------+----------+-------------------+ POP      Full           Yes      Yes                                      +---------+---------------+---------+-----------+----------+-------------------+ PTV                                                   Not well visualized +---------+---------------+---------+-----------+----------+-------------------+ PERO                                                  Not well visualized +---------+---------------+---------+-----------+----------+-------------------+   +---------+---------------+---------+-----------+----------+-------------------+ LEFT     CompressibilityPhasicitySpontaneityPropertiesThrombus Aging       +---------+---------------+---------+-----------+----------+-------------------+ CFV      Full           Yes      Yes                                      +---------+---------------+---------+-----------+----------+-------------------+ SFJ      Full                                                             +---------+---------------+---------+-----------+----------+-------------------+ FV Prox  Full                                                             +---------+---------------+---------+-----------+----------+-------------------+  FV Mid   Full                                                             +---------+---------------+---------+-----------+----------+-------------------+ FV Distal               Yes      Yes                                      +---------+---------------+---------+-----------+----------+-------------------+ PFV      Full                                                             +---------+---------------+---------+-----------+----------+-------------------+ POP      Full           Yes      Yes                                      +---------+---------------+---------+-----------+----------+-------------------+ PTV                                                   Not well visualized +---------+---------------+---------+-----------+----------+-------------------+ PERO                                                  Not well visualized +---------+---------------+---------+-----------+----------+-------------------+     Summary: RIGHT: - There is no evidence of deep vein thrombosis in the lower extremity. However, portions of this examination were limited- see technologist comments above.  - No cystic structure found in the popliteal fossa.  LEFT: - There is no evidence of deep vein thrombosis in the lower extremity. However, portions of this examination were limited- see technologist comments above.  - No cystic  structure found in the popliteal fossa.  *See table(s) above for measurements and observations. Electronically signed by Debby Robertson on 01/23/2024 at 4:44:46 PM.    Final    CT Angio Chest Pulmonary Embolism (PE) W or WO Contrast Result Date: 01/23/2024 CLINICAL DATA:  thrombus, vs mass; Pulmonary embolism (PE) suspected, high prob. Shortness of breath. Mass versus thrombus. EXAM: CT ANGIOGRAPHY CHEST, ABDOMEN AND PELVIS TECHNIQUE: Non-contrast CT of the chest was initially obtained. Multidetector CT imaging through the chest, abdomen and pelvis was performed using the standard protocol during bolus administration of intravenous contrast. Multiplanar reconstructed images and MIPs were obtained and reviewed to evaluate the vascular anatomy. RADIATION DOSE REDUCTION: This exam was performed according to the departmental dose-optimization program which includes automated exposure control, adjustment of the mA and/or kV according to patient size and/or use of iterative reconstruction technique. CONTRAST:  100mL OMNIPAQUE IOHEXOL 350 MG/ML SOLN COMPARISON:  None Available. FINDINGS: CTA CHEST FINDINGS  Cardiovascular: Heart is normal size. Aorta is normal caliber. Prior CABG. Extensive aortic atherosclerosis. No filling defects in the pulmonary arteries to suggest pulmonary emboli. Mediastinum/Nodes: No mediastinal, hilar, or axillary adenopathy. Trachea and esophagus are unremarkable. Thyroid  unremarkable. Lungs/Pleura: Trace bilateral pleural effusions with dependent/compressive atelectasis in the lower lobes. Musculoskeletal: Chest wall soft tissues are unremarkable. No acute bony abnormality. Review of the MIP images confirms the above findings. CTA ABDOMEN AND PELVIS FINDINGS VASCULAR Aorta: Slight aneurysmal dilatation of the infrarenal abdominal aorta measuring 3 cm maximally. Diffuse aortic atherosclerosis. No dissection. Celiac: Patent SMA: Patent Renals: Patent IMA: Patent Inflow: Diffuse atherosclerosis.  No aneurysm, dissection or significant stenosis. Veins: No obvious venous abnormality within the limitations of this arterial phase study. Review of the MIP images confirms the above findings. NON-VASCULAR Hepatobiliary: No focal hepatic abnormality. Gallbladder unremarkable. Pancreas: No focal abnormality or ductal dilatation. Spleen: No focal abnormality.  Normal size.  With Adrenals/Urinary Tract: Bilateral low-density enlargement of both adrenal glands compatible with hyperplasia or adenomas. No renal mass or hydronephrosis. Urinary bladder unremarkable. Stomach/Bowel: Normal appendix. Sigmoid diverticulosis. No active diverticulitis. Stomach and small bowel decompressed, unremarkable. Lymphatic: No adenopathy Reproductive: No visible focal abnormality. Other: No free fluid or free air. Musculoskeletal: No acute bony abnormality. Degenerative disc and facet disease throughout the lumbar spine. Review of the MIP images confirms the above findings. IMPRESSION: Diffuse aortic atherosclerosis. Slight aneurysmal dilatation of the infrarenal abdominal aorta measuring 3 cm. Recommend follow-up ultrasound every 3 years. (Ref.: J Vasc Surg. 2018; 67:2-77 and J Am Coll Radiol 2013;10(10):789-794.) Trace bilateral pleural effusions. Dependent/compressive atelectasis in the lower lobes. Sigmoid diverticulosis.  No active diverticulitis. No acute findings in the abdomen or pelvis. Electronically Signed   By: Franky Crease M.D.   On: 01/23/2024 12:45   CT Angio Abd/Pel w/ and/or w/o Result Date: 01/23/2024 CLINICAL DATA:  thrombus, vs mass; Pulmonary embolism (PE) suspected, high prob. Shortness of breath. Mass versus thrombus. EXAM: CT ANGIOGRAPHY CHEST, ABDOMEN AND PELVIS TECHNIQUE: Non-contrast CT of the chest was initially obtained. Multidetector CT imaging through the chest, abdomen and pelvis was performed using the standard protocol during bolus administration of intravenous contrast. Multiplanar reconstructed  images and MIPs were obtained and reviewed to evaluate the vascular anatomy. RADIATION DOSE REDUCTION: This exam was performed according to the departmental dose-optimization program which includes automated exposure control, adjustment of the mA and/or kV according to patient size and/or use of iterative reconstruction technique. CONTRAST:  100mL OMNIPAQUE IOHEXOL 350 MG/ML SOLN COMPARISON:  None Available. FINDINGS: CTA CHEST FINDINGS Cardiovascular: Heart is normal size. Aorta is normal caliber. Prior CABG. Extensive aortic atherosclerosis. No filling defects in the pulmonary arteries to suggest pulmonary emboli. Mediastinum/Nodes: No mediastinal, hilar, or axillary adenopathy. Trachea and esophagus are unremarkable. Thyroid  unremarkable. Lungs/Pleura: Trace bilateral pleural effusions with dependent/compressive atelectasis in the lower lobes. Musculoskeletal: Chest wall soft tissues are unremarkable. No acute bony abnormality. Review of the MIP images confirms the above findings. CTA ABDOMEN AND PELVIS FINDINGS VASCULAR Aorta: Slight aneurysmal dilatation of the infrarenal abdominal aorta measuring 3 cm maximally. Diffuse aortic atherosclerosis. No dissection. Celiac: Patent SMA: Patent Renals: Patent IMA: Patent Inflow: Diffuse atherosclerosis. No aneurysm, dissection or significant stenosis. Veins: No obvious venous abnormality within the limitations of this arterial phase study. Review of the MIP images confirms the above findings. NON-VASCULAR Hepatobiliary: No focal hepatic abnormality. Gallbladder unremarkable. Pancreas: No focal abnormality or ductal dilatation. Spleen: No focal abnormality.  Normal size.  With Adrenals/Urinary Tract: Bilateral low-density enlargement of both  adrenal glands compatible with hyperplasia or adenomas. No renal mass or hydronephrosis. Urinary bladder unremarkable. Stomach/Bowel: Normal appendix. Sigmoid diverticulosis. No active diverticulitis. Stomach and small bowel  decompressed, unremarkable. Lymphatic: No adenopathy Reproductive: No visible focal abnormality. Other: No free fluid or free air. Musculoskeletal: No acute bony abnormality. Degenerative disc and facet disease throughout the lumbar spine. Review of the MIP images confirms the above findings. IMPRESSION: Diffuse aortic atherosclerosis. Slight aneurysmal dilatation of the infrarenal abdominal aorta measuring 3 cm. Recommend follow-up ultrasound every 3 years. (Ref.: J Vasc Surg. 2018; 67:2-77 and J Am Coll Radiol 2013;10(10):789-794.) Trace bilateral pleural effusions. Dependent/compressive atelectasis in the lower lobes. Sigmoid diverticulosis.  No active diverticulitis. No acute findings in the abdomen or pelvis. Electronically Signed   By: Franky Crease M.D.   On: 01/23/2024 12:45   ECHOCARDIOGRAM COMPLETE Result Date: 01/23/2024    ECHOCARDIOGRAM REPORT   Patient Name:   EDELMIRO INNOCENT Date of Exam: 01/23/2024 Medical Rec #:  [id number]         Height:       66.0 in Accession #:    [id number]        Weight:       236.1 lb Date of Birth:  08-31-1950        BSA:          2.146 m Patient Age:    72 years          BP:           107/63 mmHg Patient Gender: M                 HR:           70 bpm. Exam Location:  Inpatient Procedure: 2D Echo, Cardiac Doppler, Color Doppler and Intracardiac            Opacification Agent (Both Spectral and Color Flow Doppler were            utilized during procedure). Indications:    I50.40* Unspecified combined systolic (congestive) and diastolic                 (congestive) heart failure  History:        Patient has prior history of Echocardiogram examinations, most                 recent 12/26/2022. CHF, CAD and Previous Myocardial Infarction,                 Abnormal ECG, Arrythmias:Bradycardia, Signs/Symptoms:Dyspnea and                 Shortness of Breath; Risk Factors:Sleep Apnea, Hypertension,                 Dyslipidemia and Former Smoker.  Sonographer:    Ellouise Mose RDCS  Referring Phys: [phone] ALMA L DIAZ  Sonographer Comments: Technically difficult study due to poor echo windows and patient is obese. Image acquisition challenging due to patient body habitus and Image acquisition challenging due to respiratory motion. IMPRESSIONS  1. Left ventricular ejection fraction, by estimation, is 45 to 50%. The left ventricle has mildly decreased function. The left ventricle demonstrates regional wall motion abnormalities (see scoring diagram/findings for description). The left ventricular  internal cavity size was mildly dilated. There is mild eccentric left ventricular hypertrophy of the septal segment. Left ventricular diastolic parameters are consistent with Grade I diastolic dysfunction (impaired relaxation). There is abnormal (paradoxical) septal motion, consistent with right ventricular volume overload.  2. Right ventricular systolic function is mildly reduced. The right ventricular size is mildly enlarged. There is moderately elevated pulmonary artery systolic pressure. The estimated right ventricular systolic pressure is 50.0 mmHg.  3. Left atrial size was mildly dilated.  4. Right atrial size was mildly dilated.  5. The mitral valve is grossly normal. Mild mitral valve regurgitation. No evidence of mitral stenosis.  6. Tricuspid valve regurgitation is moderate.  7. The aortic valve is tricuspid. There is mild calcification of the aortic valve. There is mild thickening of the aortic valve. Aortic valve regurgitation is not visualized. Mild aortic valve stenosis.  8. 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 The inferior vena cava is dilated in size with <50% respiratory variability, suggesting right atrial pressure of 15 mmHg. Comparison(s): Prior images unable to be directly viewed, comparison made by report only. Conclusion(s)/Recommendation(s): Mass seen in IVC--thrombus vs. other etiology, recommend CT imaging to further evaluate. Findings communicated with  Dr. Fairy and Dr. Rolan. FINDINGS  Left Ventricle: Left ventricular ejection fraction, by estimation, is 45 to 50%. The left ventricle has mildly decreased function. The left ventricle demonstrates regional wall motion abnormalities. Definity contrast agent was given IV to delineate the left ventricular endocardial borders. The left ventricular internal cavity size was mildly dilated. There is mild eccentric left ventricular hypertrophy of the septal segment. Abnormal (paradoxical) septal motion, consistent with right ventricular volume  overload and abnormal (paradoxical) septal motion, consistent with left bundle branch block. Left ventricular diastolic parameters are consistent with Grade I diastolic dysfunction (impaired relaxation).  LV Wall Scoring: The inferior wall and posterior wall are hypokinetic. The entire anterior wall, antero-lateral wall, entire septum, and entire apex are normal. Right Ventricle: The right ventricular size is mildly enlarged. No increase in right ventricular wall thickness. Right ventricular systolic function is mildly reduced. There is moderately elevated pulmonary artery systolic pressure. The tricuspid regurgitant velocity is 2.96 m/s, and with an assumed right atrial pressure of 15 mmHg, the estimated right ventricular systolic pressure is 50.0 mmHg. Left Atrium: Left atrial size was mildly dilated. Right Atrium: Right atrial size was mildly dilated. Pericardium: There is no evidence of pericardial effusion. Mitral Valve: The mitral valve is grossly normal. Mild mitral valve regurgitation. No evidence of mitral valve stenosis. Tricuspid Valve: The tricuspid valve is normal in structure. Tricuspid valve regurgitation is moderate . No evidence of tricuspid stenosis. Aortic Valve: The aortic valve is tricuspid. There is mild calcification of the aortic valve. There is mild thickening of the aortic valve. Aortic valve regurgitation is not visualized. Mild aortic stenosis is  present. Aortic valve mean gradient measures  9.0 mmHg. Aortic valve peak gradient measures 17.4 mmHg. Aortic valve area, by VTI measures 1.75 cm. Pulmonic Valve: The pulmonic valve was grossly normal. Pulmonic valve regurgitation is mild to moderate. No evidence of pulmonic stenosis. Aorta: The aortic root and ascending aorta are structurally normal, with no evidence of dilitation. 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 The inferior vena cava is dilated in size with less than 50% respiratory variability, suggesting right atrial pressure of 15 mmHg. IAS/Shunts: The interatrial septum was not well visualized.  LEFT VENTRICLE PLAX 2D LVIDd:         5.50 cm      Diastology LVIDs:         4.00 cm      LV e' medial:    7.51 cm/s LV PW:  1.30 cm      LV E/e' medial:  13.6 LV IVS:        1.30 cm      LV e' lateral:   10.10 cm/s LVOT diam:     2.10 cm      LV E/e' lateral: 10.1 LV SV:         72 LV SV Index:   34 LVOT Area:     3.46 cm LV IVRT:       95 msec  LV Volumes (MOD) LV vol d, MOD A2C: 173.0 ml LV vol d, MOD A4C: 89.7 ml LV vol s, MOD A2C: 110.0 ml LV vol s, MOD A4C: 36.8 ml LV SV MOD A2C:     63.0 ml LV SV MOD A4C:     89.7 ml LV SV MOD BP:      65.0 ml RIGHT VENTRICLE            IVC RV Basal diam:  4.73 cm    IVC diam: 4.00 cm RV S prime:     8.05 cm/s TAPSE (M-mode): 1.4 cm LEFT ATRIUM             Index        RIGHT ATRIUM           Index LA diam:        5.40 cm 2.52 cm/m   RA Area:     21.20 cm LA Vol (A2C):   89.6 ml 41.75 ml/m  RA Volume:   64.60 ml  30.10 ml/m LA Vol (A4C):   49.2 ml 22.93 ml/m LA Biplane Vol: 66.0 ml 30.76 ml/m  AORTIC VALVE AV Area (Vmax):    1.75 cm AV Area (Vmean):   1.69 cm AV Area (VTI):     1.75 cm AV Vmax:           208.33 cm/s AV Vmean:          139.333 cm/s AV VTI:            0.411 m AV Peak Grad:      17.4 mmHg AV Mean Grad:      9.0 mmHg LVOT Vmax:         105.00 cm/s LVOT Vmean:        68.100 cm/s LVOT VTI:          0.208 m  LVOT/AV VTI ratio: 0.51  AORTA Ao Root diam: 3.50 cm Ao Asc diam:  3.60 cm MITRAL VALVE                TRICUSPID VALVE MV Area (PHT): 4.94 cm     TR Peak grad:   35.0 mmHg MV Decel Time: 154 msec     TR Vmax:        296.00 cm/s MV E velocity: 102.50 cm/s MV A velocity: 69.85 cm/s   SHUNTS MV E/A ratio:  1.47         Systemic VTI:  0.21 m                             Systemic Diam: 2.10 cm Shelda Bruckner MD Electronically signed by Shelda Bruckner MD Signature Date/Time: 01/23/2024/10:33:13 AM    Final    US  EKG SITE RITE Result Date: 01/22/2024 If Site Rite image not attached, placement could not be confirmed due to current cardiac rhythm.  DG Chest 2 View Result Date: 01/21/2024 CLINICAL DATA:  Short of  breath EXAM: CHEST - 2 VIEW COMPARISON:  09/18/2023 FINDINGS: Frontal and lateral views of the chest demonstrate postsurgical changes from median sternotomy. The cardiac silhouette is enlarged but stable. There is background emphysema, with superimposed increased interstitial prominence and trace pleural effusion since prior study, consistent with mild fluid overload. No focal consolidation or pneumothorax. No acute bony abnormalities. IMPRESSION: 1. Mild interstitial edema superimposed upon background emphysema. 2. Trace bilateral pleural effusions. Electronically Signed   By: Ozell Daring M.D.   On: 01/21/2024 15:01    Microbiology: No results found for this or any previous visit (from the past 240 hours).   Labs: Basic Metabolic Panel: Recent Labs  Lab 01/21/24 1956 01/22/24 0223 01/23/24 0305 01/24/24 0507 01/25/24 0531 01/26/24 0525  NA  --  140 140 143 139 139  K  --  3.5 3.9 3.3* 3.6 3.5  CL  --  104 105 107 94* 96*  CO2  --  28 26 29  32 32  GLUCOSE  --  96 111* 83 101* 100*  BUN  --  15 15 12 15 19   CREATININE  --  0.86 0.90 0.74 1.04 1.09  CALCIUM  --  8.5* 8.9 8.4* 9.7 9.8  MG 1.9  --  2.0 2.0  --   --   PHOS 3.2  --   --   --   --   --    Liver Function  Tests: No results for input(s): AST, ALT, ALKPHOS, BILITOT, PROT, ALBUMIN in the last 168 hours. No results for input(s): LIPASE, AMYLASE in the last 168 hours. No results for input(s): AMMONIA in the last 168 hours. CBC: Recent Labs  Lab 01/23/24 0305 01/24/24 0507 01/24/24 1739 01/25/24 0531 01/26/24 0525  WBC 7.0 6.7 9.0 7.3 7.9  HGB 7.8* 7.2* 8.4* 7.9* 8.6*  HCT 27.2* 25.2* 29.3* 26.9* 29.6*  MCV 83.4 83.2 83.2 81.8 83.6  PLT 213 185 210 202 208   Cardiac Enzymes: No results for input(s): CKTOTAL, CKMB, CKMBINDEX, TROPONINI in the last 168 hours. BNP: BNP (last 3 results) No results for input(s): BNP in the last 8760 hours.  ProBNP (last 3 results) Recent Labs    11/23/23 0926 01/21/24 1316  PROBNP 624* 750.0*    CBG: No results for input(s): GLUCAP in the last 168 hours.     Signed:  Sigurd Pac MD.  [facility] 01/26/2024, 1:20 PM

## 2024-01-29 DIAGNOSIS — D509 Iron deficiency anemia, unspecified: Secondary | ICD-10-CM | POA: Diagnosis not present

## 2024-01-29 DIAGNOSIS — I872 Venous insufficiency (chronic) (peripheral): Secondary | ICD-10-CM | POA: Diagnosis not present

## 2024-01-29 DIAGNOSIS — I110 Hypertensive heart disease with heart failure: Secondary | ICD-10-CM | POA: Diagnosis not present

## 2024-01-29 DIAGNOSIS — D62 Acute posthemorrhagic anemia: Secondary | ICD-10-CM | POA: Diagnosis not present

## 2024-01-29 DIAGNOSIS — I5043 Acute on chronic combined systolic (congestive) and diastolic (congestive) heart failure: Secondary | ICD-10-CM | POA: Diagnosis not present

## 2024-01-29 DIAGNOSIS — E785 Hyperlipidemia, unspecified: Secondary | ICD-10-CM | POA: Diagnosis not present

## 2024-01-29 DIAGNOSIS — J449 Chronic obstructive pulmonary disease, unspecified: Secondary | ICD-10-CM | POA: Diagnosis not present

## 2024-01-29 DIAGNOSIS — I2510 Atherosclerotic heart disease of native coronary artery without angina pectoris: Secondary | ICD-10-CM | POA: Diagnosis not present

## 2024-01-29 DIAGNOSIS — G4730 Sleep apnea, unspecified: Secondary | ICD-10-CM | POA: Diagnosis not present

## 2024-01-29 DIAGNOSIS — R739 Hyperglycemia, unspecified: Secondary | ICD-10-CM | POA: Diagnosis not present

## 2024-01-29 DIAGNOSIS — Z125 Encounter for screening for malignant neoplasm of prostate: Secondary | ICD-10-CM | POA: Diagnosis not present

## 2024-01-30 DIAGNOSIS — I110 Hypertensive heart disease with heart failure: Secondary | ICD-10-CM | POA: Diagnosis not present

## 2024-01-30 DIAGNOSIS — J9601 Acute respiratory failure with hypoxia: Secondary | ICD-10-CM | POA: Diagnosis not present

## 2024-01-30 DIAGNOSIS — K921 Melena: Secondary | ICD-10-CM | POA: Diagnosis not present

## 2024-01-30 DIAGNOSIS — I5022 Chronic systolic (congestive) heart failure: Secondary | ICD-10-CM | POA: Diagnosis not present

## 2024-01-30 DIAGNOSIS — D62 Acute posthemorrhagic anemia: Secondary | ICD-10-CM | POA: Diagnosis not present

## 2024-01-30 DIAGNOSIS — J441 Chronic obstructive pulmonary disease with (acute) exacerbation: Secondary | ICD-10-CM | POA: Diagnosis not present

## 2024-02-02 DIAGNOSIS — S61210A Laceration without foreign body of right index finger without damage to nail, initial encounter: Secondary | ICD-10-CM | POA: Diagnosis not present

## 2024-02-04 DIAGNOSIS — J9601 Acute respiratory failure with hypoxia: Secondary | ICD-10-CM | POA: Diagnosis not present

## 2024-02-04 DIAGNOSIS — D62 Acute posthemorrhagic anemia: Secondary | ICD-10-CM | POA: Diagnosis not present

## 2024-02-04 DIAGNOSIS — K921 Melena: Secondary | ICD-10-CM | POA: Diagnosis not present

## 2024-02-04 DIAGNOSIS — I5022 Chronic systolic (congestive) heart failure: Secondary | ICD-10-CM | POA: Diagnosis not present

## 2024-02-04 DIAGNOSIS — I110 Hypertensive heart disease with heart failure: Secondary | ICD-10-CM | POA: Diagnosis not present

## 2024-02-04 DIAGNOSIS — J441 Chronic obstructive pulmonary disease with (acute) exacerbation: Secondary | ICD-10-CM | POA: Diagnosis not present

## 2024-02-05 DIAGNOSIS — I5022 Chronic systolic (congestive) heart failure: Secondary | ICD-10-CM | POA: Diagnosis not present

## 2024-02-05 DIAGNOSIS — J441 Chronic obstructive pulmonary disease with (acute) exacerbation: Secondary | ICD-10-CM | POA: Diagnosis not present

## 2024-02-05 DIAGNOSIS — I110 Hypertensive heart disease with heart failure: Secondary | ICD-10-CM | POA: Diagnosis not present

## 2024-02-05 DIAGNOSIS — D62 Acute posthemorrhagic anemia: Secondary | ICD-10-CM | POA: Diagnosis not present

## 2024-02-05 DIAGNOSIS — J9601 Acute respiratory failure with hypoxia: Secondary | ICD-10-CM | POA: Diagnosis not present

## 2024-02-05 DIAGNOSIS — K921 Melena: Secondary | ICD-10-CM | POA: Diagnosis not present

## 2024-02-05 DIAGNOSIS — R509 Fever, unspecified: Secondary | ICD-10-CM | POA: Diagnosis not present

## 2024-02-05 DIAGNOSIS — K30 Functional dyspepsia: Secondary | ICD-10-CM | POA: Diagnosis not present

## 2024-02-06 ENCOUNTER — Ambulatory Visit: Attending: Cardiology | Admitting: Cardiology

## 2024-02-06 ENCOUNTER — Encounter: Payer: Self-pay | Admitting: Cardiology

## 2024-02-06 VITALS — BP 130/80 | HR 71 | Ht 66.0 in | Wt 235.0 lb

## 2024-02-06 DIAGNOSIS — R350 Frequency of micturition: Secondary | ICD-10-CM | POA: Insufficient documentation

## 2024-02-06 DIAGNOSIS — E785 Hyperlipidemia, unspecified: Secondary | ICD-10-CM | POA: Diagnosis not present

## 2024-02-06 DIAGNOSIS — J410 Simple chronic bronchitis: Secondary | ICD-10-CM | POA: Diagnosis not present

## 2024-02-06 DIAGNOSIS — I872 Venous insufficiency (chronic) (peripheral): Secondary | ICD-10-CM | POA: Diagnosis not present

## 2024-02-06 DIAGNOSIS — I2510 Atherosclerotic heart disease of native coronary artery without angina pectoris: Secondary | ICD-10-CM | POA: Diagnosis not present

## 2024-02-06 DIAGNOSIS — R0609 Other forms of dyspnea: Secondary | ICD-10-CM | POA: Insufficient documentation

## 2024-02-06 DIAGNOSIS — I10 Essential (primary) hypertension: Secondary | ICD-10-CM | POA: Insufficient documentation

## 2024-02-06 DIAGNOSIS — I5042 Chronic combined systolic (congestive) and diastolic (congestive) heart failure: Secondary | ICD-10-CM | POA: Diagnosis not present

## 2024-02-06 NOTE — Progress Notes (Unsigned)
 Cardiology Office Note:    Date:  02/06/2024   ID:  Phillip Dunn, DOB [birth year], MRN [id number]  PCP:  Keren Vicenta BRAVO, MD  Cardiologist:  Lamar Fitch, MD    Referring MD: Keren Vicenta BRAVO, MD   Chief Complaint  Patient presents with   Follow-up  I was in the hospital  History of Present Illness:    Phillip Dunn is a 73 y.o. male with past medical history significant for congestive heart failure, ejection fraction 45% with inferior wall hypokinesis, type 2 diabetes, dyslipidemia, hypertension, history of MI, lumbar radiculopathy, obstructive sleep apnea on CPAP, COPD, venous insufficiency, recent history of GI bleed that required some intervention, recently discharged from [facility] where he was admitted with decompensated CHF.  Excellent diuresis achieved 14 L negative, he lost 24 pounds.  He was discharged home.  On guideline directed medical therapy with Farxiga Aldactone and torsemide however he started complaining of having some dysuric symptoms and he did stop his Farxiga yesterday, urinalysis was done yesterday but have no results of this yet.  He also described a failure to get difficulty urinating he will urinate frequently very little he gets an urge to pee and to go to the restroom but that does not produce desired results.  Not much shortness of breath weakness fatigue tiredness.  No chest pain tightness squeezing pressure burning chest  Past Medical History:  Diagnosis Date   Abnormal endocrine laboratory test finding 08/02/2021   Adrenal disorder    Asthma    Benign hypertensive heart disease with CHF (congestive heart failure) (HCC)    Bradycardia    Carpal tunnel syndrome of right wrist 05/08/2019   Class 2 severe obesity with serious comorbidity and body mass index (BMI) of 39.0 to 39.9 in adult 08/08/2021   Coronary artery aneurysm 06/19/2017   Measuring 14 mm on CT from February 2019   Coronary artery disease    Coronary artery disease  involving native coronary artery of native heart without angina pectoris 12/08/2014   Overview:  Multiple stents to circumflex artery in March 18, 2014  Formatting of this note might be different from the original. 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   Myocardial infarct (HCC)    OSA (obstructive sleep apnea)    Secondary hypotension    Sleep apnea 11/28/2016   Strain of thoracic back region 01/17/2023   Strain of thoracic region 01/17/2023   Trigger finger, right middle finger 10/14/2019   Venous insufficiency of left lower extremity     Past Surgical History:  Procedure Laterality Date   CARDIAC CATHETERIZATION     CARPAL TUNNEL RELEASE Left    CATARACT EXTRACTION, BILATERAL     CORONARY ANGIOPLASTY     CORONARY ARTERY BYPASS GRAFT     Right Hip replacement Right 06/2020    Current Medications: Current Meds  Medication Sig   azelastine (ASTELIN) 0.1 % nasal spray Place 1 spray into both nostrils 2 (two) times daily.     Allergies:   Grass pollen([id number] vern), Other, and Pollen extract   Social History   Socioeconomic History   Marital status: Married    Spouse name:  Not on file   Number of children: Not on file   Years of education: Not on file   Highest education level: Not on file  Occupational History   Not on file  Tobacco Use   Smoking status: Former    Types: Cigarettes   Smokeless tobacco: Never  Vaping Use   Vaping status: Former  Substance and Sexual Activity   Alcohol use: Yes    Alcohol/week: 2.0 - 3.0 standard drinks of alcohol    Types: 2 - 3 Glasses of wine per week    Comment: daily   Drug use: No   Sexual  activity: Not on file  Other Topics Concern   Not on file  Social History Narrative   Not on file   Social Drivers of Health   Financial Resource Strain: Not on file  Food Insecurity: No Food Insecurity (01/21/2024)   Hunger Vital Sign    Worried About Running Out of Food in the Last Year: Never true    Ran Out of Food in the Last Year: Never true  Transportation Needs: No Transportation Needs (01/21/2024)   PRAPARE - Administrator, Civil Service (Medical): No    Lack of Transportation (Non-Medical): No  Physical Activity: Not on file  Stress: Not on file  Social Connections: Moderately Isolated (01/21/2024)   Social Connection and Isolation Panel    Frequency of Communication with Friends and Family: More than three times a week    Frequency of Social Gatherings with Friends and Family: Once a week    Attends Religious Services: Never    Database Administrator or Organizations: No    Attends Engineer, Structural: Never    Marital Status: Married     Family History: The patient's family history includes Stroke in his mother. ROS:   Please see the history of present illness.    All 14 point review of systems negative except as described per history of present illness  EKGs/Labs/Other Studies Reviewed:         Recent Labs: 09/17/2023: ALT 38 01/21/2024: Pro Brain Natriuretic Peptide 750.0 01/24/2024: Magnesium 2.0 01/26/2024: BUN 19; Creatinine, Ser 1.09; Hemoglobin 8.6; Platelets 208; Potassium 3.5; Sodium 139  Recent Lipid Panel    Component Value Date/Time   CHOL 114 02/01/2022 1546   TRIG 87 02/01/2022 1546   HDL 43 02/01/2022 1546   CHOLHDL 2.7 02/01/2022 1546   LDLCALC 54 02/01/2022 1546    Physical Exam:    VS:  BP 130/80   Pulse 71   Ht 5' 6 (1.676 m)   Wt 235 lb (106.6 kg)   SpO2 96%   BMI 37.93 kg/m     Wt Readings from Last 3 Encounters:  02/06/24 235 lb (106.6 kg)  01/26/24 231 lb 11.3 oz (105.1 kg)  01/16/24 254 lb  6.4 oz (115.4 kg)     GEN:  Well nourished, well developed in no acute distress HEENT: Normal NECK: No JVD; No carotid bruits LYMPHATICS: No lymphadenopathy CARDIAC: RRR, no murmurs, no rubs, no gallops RESPIRATORY:  Clear to auscultation without rales, wheezing or rhonchi  ABDOMEN: Soft, non-tender, non-distended MUSCULOSKELETAL:  No edema; No deformity  SKIN: Warm and dry LOWER EXTREMITIES: no swelling NEUROLOGIC:  Alert and oriented x 3 PSYCHIATRIC:  Normal affect   ASSESSMENT:    1. Coronary artery disease involving native coronary artery of native heart without angina pectoris   2. Chronic venous insufficiency   3. Essential hypertension  4. Simple chronic bronchitis (HCC)   5. Dyslipidemia   6. Chronic combined systolic and diastolic congestive heart failure (HCC)    PLAN:    In order of problems listed above:  Coronary disease: Stable from that point review no recent issues. Congestive heart failure.  Look compensated on her physical exam.  After good diuresis.  Weight is down, will check Chem-7 proBNP and CBC today, I asked him to check his weight every single day let me know if weight goes up.  Agree with discontinuation fark [employer] right now until we have the situation solved in terms of urinary tract potential infection, urinalysis is pending. Urine hesitancy, will schedule him to see a urologist. Dyslipidemia he is taking Lipitor 80 which I will continue. Overall return to deteriorated but hopefully will be able to put him back on the track.  He did have very good diuresis, will continue doing this, urology need to be seen for potential prostate hypertrophy, we will check his PSA as well Record from hospital reviewed   Medication Adjustments/Labs and Tests Ordered: Current medicines are reviewed at length with the patient today.  Concerns regarding medicines are outlined above.  No orders of the defined types were placed in this encounter.  Medication changes: No  orders of the defined types were placed in this encounter.   Signed, Lamar DOROTHA Fitch, MD, [facility] 02/06/2024 8:26 AM    [facility]

## 2024-02-06 NOTE — Patient Instructions (Addendum)
 Medication Instructions:  Your physician recommends that you continue on your current medications as directed. Please refer to the Current Medication list given to you today.  *If you need a refill on your cardiac medications before your next appointment, please call your pharmacy*   Lab Work: BMP, PSA, ProBMP, CBC- today If you have labs (blood work) drawn today and your tests are completely normal, you will receive your results only by: MyChart Message (if you have MyChart) OR A paper copy in the mail If you have any lab test that is abnormal or we need to change your treatment, we will call you to review the results.   Testing/Procedures: None Ordered   Follow-Up: At [facility], you and your health needs are our priority.  As part of our continuing mission to provide you with exceptional heart care, we have created designated Provider Care Teams.  These Care Teams include your primary Cardiologist (physician) and Advanced Practice Providers (APPs -  Physician Assistants and Nurse Practitioners) who all work together to provide you with the care you need, when you need it.  We recommend signing up for the patient portal called MyChart.  Sign up information is provided on this After Visit Summary.  MyChart is used to connect with patients for Virtual Visits (Telemedicine).  Patients are able to view lab/test results, encounter notes, upcoming appointments, etc.  Non-urgent messages can be sent to your provider as well.   To learn more about what you can do with MyChart, go to [email or web address].    Your next appointment:   2 week(s)  The format for your next appointment:   In Person  Provider:   Lamar Fitch, MD    Other Instructions Ref to urology- Germain Brothers, MD [facility]  Urology  [address] [place], [place] [address]   Phone: [phone] Fax: [phone]

## 2024-02-07 ENCOUNTER — Ambulatory Visit: Payer: Self-pay | Admitting: Cardiology

## 2024-02-07 DIAGNOSIS — E66813 Obesity, class 3: Secondary | ICD-10-CM | POA: Diagnosis not present

## 2024-02-07 DIAGNOSIS — J449 Chronic obstructive pulmonary disease, unspecified: Secondary | ICD-10-CM | POA: Diagnosis not present

## 2024-02-07 DIAGNOSIS — M5416 Radiculopathy, lumbar region: Secondary | ICD-10-CM | POA: Diagnosis not present

## 2024-02-07 DIAGNOSIS — Z7982 Long term (current) use of aspirin: Secondary | ICD-10-CM | POA: Diagnosis not present

## 2024-02-07 DIAGNOSIS — E119 Type 2 diabetes mellitus without complications: Secondary | ICD-10-CM | POA: Diagnosis not present

## 2024-02-07 DIAGNOSIS — I110 Hypertensive heart disease with heart failure: Secondary | ICD-10-CM | POA: Diagnosis not present

## 2024-02-07 DIAGNOSIS — Z9989 Dependence on other enabling machines and devices: Secondary | ICD-10-CM | POA: Diagnosis not present

## 2024-02-07 DIAGNOSIS — Z7951 Long term (current) use of inhaled steroids: Secondary | ICD-10-CM | POA: Diagnosis not present

## 2024-02-07 DIAGNOSIS — D62 Acute posthemorrhagic anemia: Secondary | ICD-10-CM | POA: Diagnosis not present

## 2024-02-07 DIAGNOSIS — I2510 Atherosclerotic heart disease of native coronary artery without angina pectoris: Secondary | ICD-10-CM | POA: Diagnosis not present

## 2024-02-07 DIAGNOSIS — K921 Melena: Secondary | ICD-10-CM | POA: Diagnosis not present

## 2024-02-07 DIAGNOSIS — J9611 Chronic respiratory failure with hypoxia: Secondary | ICD-10-CM | POA: Diagnosis not present

## 2024-02-07 DIAGNOSIS — Z6837 Body mass index (BMI) 37.0-37.9, adult: Secondary | ICD-10-CM | POA: Diagnosis not present

## 2024-02-07 DIAGNOSIS — I7143 Infrarenal abdominal aortic aneurysm, without rupture: Secondary | ICD-10-CM | POA: Diagnosis not present

## 2024-02-07 DIAGNOSIS — I252 Old myocardial infarction: Secondary | ICD-10-CM | POA: Diagnosis not present

## 2024-02-07 DIAGNOSIS — I872 Venous insufficiency (chronic) (peripheral): Secondary | ICD-10-CM | POA: Diagnosis not present

## 2024-02-07 DIAGNOSIS — Z955 Presence of coronary angioplasty implant and graft: Secondary | ICD-10-CM | POA: Diagnosis not present

## 2024-02-07 DIAGNOSIS — G4733 Obstructive sleep apnea (adult) (pediatric): Secondary | ICD-10-CM | POA: Diagnosis not present

## 2024-02-07 DIAGNOSIS — Z87891 Personal history of nicotine dependence: Secondary | ICD-10-CM | POA: Diagnosis not present

## 2024-02-07 DIAGNOSIS — E785 Hyperlipidemia, unspecified: Secondary | ICD-10-CM | POA: Diagnosis not present

## 2024-02-07 DIAGNOSIS — I5023 Acute on chronic systolic (congestive) heart failure: Secondary | ICD-10-CM | POA: Diagnosis not present

## 2024-02-07 DIAGNOSIS — N401 Enlarged prostate with lower urinary tract symptoms: Secondary | ICD-10-CM | POA: Diagnosis not present

## 2024-02-07 DIAGNOSIS — Z951 Presence of aortocoronary bypass graft: Secondary | ICD-10-CM | POA: Diagnosis not present

## 2024-02-07 DIAGNOSIS — Z7902 Long term (current) use of antithrombotics/antiplatelets: Secondary | ICD-10-CM | POA: Diagnosis not present

## 2024-02-07 DIAGNOSIS — R338 Other retention of urine: Secondary | ICD-10-CM | POA: Diagnosis not present

## 2024-02-07 LAB — CBC
Hematocrit: 37.7 % (ref 37.5–51.0)
Hemoglobin: 11.1 g/dL — ABNORMAL LOW (ref 13.0–17.7)
MCH: 24.7 pg — ABNORMAL LOW (ref 26.6–33.0)
MCHC: 29.4 g/dL — ABNORMAL LOW (ref 31.5–35.7)
MCV: 84 fL (ref 79–97)
Platelets: 229 x10E3/uL (ref 150–450)
RBC: 4.49 x10E6/uL (ref 4.14–5.80)
RDW: 19.7 % — ABNORMAL HIGH (ref 11.6–15.4)
WBC: 11.6 x10E3/uL — ABNORMAL HIGH (ref 3.4–10.8)

## 2024-02-07 LAB — BASIC METABOLIC PANEL WITH GFR
BUN/Creatinine Ratio: 21 (ref 10–24)
BUN: 22 mg/dL (ref 8–27)
CO2: 24 mmol/L (ref 20–29)
Calcium: 10.1 mg/dL (ref 8.6–10.2)
Chloride: 102 mmol/L (ref 96–106)
Creatinine, Ser: 1.04 mg/dL (ref 0.76–1.27)
Glucose: 88 mg/dL (ref 70–99)
Potassium: 4.9 mmol/L (ref 3.5–5.2)
Sodium: 142 mmol/L (ref 134–144)
eGFR: 76 mL/min/1.73 (ref >59)

## 2024-02-07 LAB — PRO B NATRIURETIC PEPTIDE: NT-Pro BNP: 1647 pg/mL — ABNORMAL HIGH (ref 0–376)

## 2024-02-07 LAB — PSA: Prostate Specific Ag, Serum: 2.2 ng/mL (ref 0.0–4.0)

## 2024-02-08 ENCOUNTER — Encounter (HOSPITAL_COMMUNITY): Payer: Self-pay

## 2024-02-08 ENCOUNTER — Inpatient Hospital Stay (HOSPITAL_BASED_OUTPATIENT_CLINIC_OR_DEPARTMENT_OTHER)
Admit: 2024-02-08 | Discharge: 2024-02-08 | Disposition: A | Discharge status: Home / Self Care | Attending: Physician Assistant | Admitting: Physician Assistant

## 2024-02-08 VITALS — BP 110/60 | HR 97 | Ht 66.0 in | Wt 231.0 lb

## 2024-02-08 DIAGNOSIS — I2510 Atherosclerotic heart disease of native coronary artery without angina pectoris: Secondary | ICD-10-CM | POA: Diagnosis not present

## 2024-02-08 DIAGNOSIS — I10 Essential (primary) hypertension: Secondary | ICD-10-CM

## 2024-02-08 DIAGNOSIS — I5022 Chronic systolic (congestive) heart failure: Secondary | ICD-10-CM | POA: Diagnosis not present

## 2024-02-08 DIAGNOSIS — G4733 Obstructive sleep apnea (adult) (pediatric): Secondary | ICD-10-CM | POA: Diagnosis not present

## 2024-02-08 DIAGNOSIS — D509 Iron deficiency anemia, unspecified: Secondary | ICD-10-CM | POA: Diagnosis not present

## 2024-02-08 MED ORDER — TORSEMIDE 20 MG PO TABS: 80.0000 mg | ORAL_TABLET | Freq: Every day | ORAL | 6 refills | Status: DC

## 2024-02-08 NOTE — Progress Notes (Addendum)
 Advanced Heart Failure Team Clinic Note  Cardiologist: Dr. Bernie HF Cardiologist: Dr. Rolan  Chief Complaint:   HPI: Phillip Dunn is a 73 y.o. male with chronic combined systolic and diastolic HF, CAD s/p CABG x2 00' and multiple PCI, chronic hypoxic resp failure on 4L, HTN, HLD, obesity, OSA on CPAP, chronic leg swelling, and hx GIB.   Echo 9/24: EF 55-60%. RV normal, trivial MR, mild AV stenosis, AV mean gradient 12 mmHg.    Echo 6/25 [facility]): EF 45-50%.    Recently admitted to [facility] for GIB 9/25. Got 4 units of blood. Scope showed duodenal ulcers and barrett's esophagus. ASA stopped, remained on plavix .    Seen in cardiology clinic 01/16/24. Started on lasix ? (Though he reports taking 120 mg daily for years). BP was soft at this time.    Admitted 01/21/24 with gradual weight gain, SOB, chest tightness and fatigue.  He was massively volume overloaded. Hypotensive with attempts to diuresis. Advanced Heart Failure consulted to assist with management. Diuresed with high dose IV lasix  and intermittent metolazone. Did not require inotropic support. 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 Subsequently had CT and MR venogram with no significant abnormality.  He saw his primary cardiologist, Dr. Bernie a few days ago. Stopped Farxiga d/t urinary hesitancy. Has been referred to Urology. His PCP started him on abx for a UTI.  Here today for CHF follow-up. His home weight is down 4 lb from discharge (233 > 229 lb). Lower extremity edema has significantly improved, wearing compression stockings.  However, still feels like he has some fluid on board. He is trying to watch fluid and sodium intake. Gets fatigued easily with activity. Has been using his stationary bike daily to try to improve his endurance. Uses CPAP nightly.  ROS: All systems negative except as listed in HPI, PMH and Problem List.  SH:  Social History   Socioeconomic History   Marital status: Married    Spouse  name: Not on file   Number of children: Not on file   Years of education: Not on file   Highest education level: Not on file  Occupational History   Not on file  Tobacco Use   Smoking status: Former    Types: Cigarettes   Smokeless tobacco: Never  Vaping Use   Vaping status: Former  Substance and Sexual Activity   Alcohol use: Yes    Alcohol/week: 2.0 - 3.0 standard drinks of alcohol    Types: 2 - 3 Glasses of wine per week    Comment: daily   Drug use: No   Sexual activity: Not on file  Other Topics Concern   Not on file  Social History Narrative   Not on file   Social Drivers of Health   Financial Resource Strain: Not on file  Food Insecurity: No Food Insecurity (01/21/2024)   Hunger Vital Sign    Worried About Running Out of Food in the Last Year: Never true    Ran Out of Food in the Last Year: Never true  Transportation Needs: No Transportation Needs (01/21/2024)   PRAPARE - Administrator, Civil Service (Medical): No    Lack of Transportation (Non-Medical): No  Physical Activity: Not on file  Stress: Not on file  Social Connections: Moderately Isolated (01/21/2024)   Social Connection and Isolation Panel    Frequency of Communication with Friends and Family: More than three times a week    Frequency of Social Gatherings with Friends and Family: Once  a week    Attends Religious Services: Never    Active Member of Clubs or Organizations: No    Attends Banker Meetings: Never    Marital Status: Married  Catering Manager Violence: Not At Risk (01/21/2024)   Humiliation, Afraid, Rape, and Kick questionnaire    Fear of Current or Ex-Partner: No    Emotionally Abused: No    Physically Abused: No    Sexually Abused: No    FH:  Family History  Problem Relation Age of Onset   Stroke Mother     Past Medical History:  Diagnosis Date   Abnormal endocrine laboratory test finding 08/02/2021   Adrenal disorder    Asthma    Benign  hypertensive heart disease with CHF (congestive heart failure) (HCC)    Bradycardia    Carpal tunnel syndrome of right wrist 05/08/2019   Class 2 severe obesity with serious comorbidity and body mass index (BMI) of 39.0 to 39.9 in adult 08/08/2021   Coronary artery aneurysm 06/19/2017   Measuring 14 mm on CT from February 2019   Coronary artery disease    Coronary artery disease involving native coronary artery of native heart without angina pectoris 12/08/2014   Overview:  Multiple stents to circumflex artery in March 18, 2014  Formatting of this note might be different from the original. 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   Myocardial infarct (HCC)    OSA (obstructive sleep apnea)    Secondary hypotension    Sleep apnea 11/28/2016   Strain of thoracic back region 01/17/2023   Strain of thoracic region 01/17/2023   Trigger finger, right middle finger 10/14/2019   Venous insufficiency of left lower extremity     Current Outpatient Medications  Medication Sig Dispense Refill   albuterol  (VENTOLIN  HFA) 108 (90 Base) MCG/ACT inhaler Inhale 2 puffs into the lungs every 4 (four) hours as needed for wheezing.     aspirin EC 81 MG tablet Take 81 mg by mouth daily.     atorvastatin (LIPITOR) 80 MG tablet Take 80 mg by mouth daily.     clopidogrel  (PLAVIX ) 75 MG tablet TAKE 1 TABLET EVERY DAY 90 tablet 2   Coenzyme Q10 (COQ-10) 10 MG CAPS Take 10 mg by mouth daily.     ferrous sulfate 325 (65 FE) MG EC tablet Take 325 mg by mouth 2 (two) times daily.     finasteride  (PROSCAR ) 5 MG tablet Take 1 tablet (5 mg total) by mouth daily. 90 tablet 1   IPRATROPIUM  BROMIDE IN Place 1 spray into both nostrils 2 (two) times daily.     KRILL OIL PO Take 1,000 mg by mouth daily.     MAGNESIUM-OXIDE 400 (240 Mg) MG tablet Take 1 tablet by mouth daily.     montelukast (SINGULAIR) 10 MG tablet Take 10 mg by mouth daily.     omeprazole (PRILOSEC) 40 MG capsule Take 40 mg by mouth 2 (two) times daily.     OVER THE COUNTER MEDICATION Take 1 tablet by mouth daily. Tylenol ES     potassium chloride  SA (KLOR-CON  M) 20 MEQ tablet Take 2 tablets (40 mEq  total) by mouth daily. 60 tablet 1   spironolactone (ALDACTONE) 25 MG tablet Take 1 tablet (25 mg total) by mouth daily. 30 tablet 1   SYMBICORT 160-4.5 MCG/ACT inhaler Inhale 2 puffs into the lungs 2 (two) times daily.     tamsulosin (FLOMAX) 0.4 MG CAPS capsule Take 0.4 mg by mouth daily.     zolpidem (AMBIEN) 10 MG tablet Take 10 mg by mouth at bedtime.     azelastine (ASTELIN) 0.1 % nasal spray Place 1 spray into both nostrils 2 (two) times daily.     torsemide (DEMADEX) 20 MG tablet Take 4 tablets (80 mg total) by mouth daily. 120 tablet 6   No current facility-administered medications for this encounter.    Vitals:   02/08/24 1437  BP: 110/60  Pulse: 97  SpO2: 97%  Weight: 104.8 kg (231 lb)  Height: 5' 6 (1.676 m)    PHYSICAL EXAM:  General:  Chronically ill appearing elderly male Cor: Jvp 8-9 cm. Regular rate & rhythm. No murmurs. Lungs: clear Abdomen: soft, nontender, nondistended.  Extremities: 1+ edema, + compression stockings, chronic venous stasis changes noted Neuro: alert & orientedx3. Affect pleasant.    ASSESSMENT & PLAN: Chronic HFmrEF: Ischemic cardiomyopathy.  - Echo 9/24: EF 55-60%. RV normal, trivial MR, mild AV stenosis, AV mean gradient 12 mmHg.  - Echo 6/25 [facility]): EF 45-50%, basal to mid inferior and inferolateral AK, mild RV dilatation with okay function, RVSP 57 mmhg - See last LHC in #2.  - Echo 10/25 EF 45-50%, inferior/inferolateral hypokinesis, mild LV dilation/mild  LVH, mild RV enlargement/mild RV dilation, moderate TR.  - Suspect component of ICM w/ inferior/inferolateral WMAs.  - NYHA III. Volume up on exam. ProBNP from 11/5 was elevated at 1647(previously averaged around 600). Increase Torsemide to 80 mg daily - Now off Farxiga after recent UTI - Continue spiro 25 mg daily - Discussed adding back low-dose losartan  to regimen. He would like to focus on volume removal first then optimize GDMT. Will defer to his Cardiologist, Dr. Krasowski. - Will update appointment notes for his Cardiology appointment in about 10 days to see if BMET and proBNP can be rechecked d/t adjustments in diuretics today.   CAD s/p CABG and multiple PCI - Coronary CT 8/19: severe 3V CAD, occluded prox RCA, mid LAD/ patent LIMA to mid LAD, mid and distal LAD. LCx long stented area, unable to determine patency.  - LHC ([facility]) 01/2019 multivessel CAD, acute OM1 occlusion, known CTO of RCA and SVG to PDA, known patent LIMA to LAD graft, mild LV dysfunction. Unsuccessful PCI of OM1 due to thrombus burden within the known OM1 aneurysmal segment. guidewire would not fully traverse the occluded segment which thrombosed after brief interruption of DAPT 5 years after prior OM intervention. Medically managed.  - No chest pain. - On aspirin + plavix  long-term - Continue statin   HTN - BP controlled - Meds as above - titrating GDMT for HF rather than BP goal   Recent GIB Iron deficiency anemia - Scope 9/25 with duodenal ulcers and barretts esophagus.  - Hgb recently improved to 11 from 7s - IV iron during recent admit.   Venous insufficiency Chronic leg swelling - Wears compression stockings   COPD Chronic hypoxic respiratory failure - on home 4L   OSA on CPAP - Wears CPAP nightly   8.  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  Venous dopplers negative for DVT and CTA chest negative for PE. CT abdomen/pelvis with no  renal mass or evidence for renal cell carcinoma.   -  Pending MRA/MRV chest read (study done) to assess for IVC thrombus => hopefully can make decision on anticoagulation when read is back.       Follow up: As needed per patient request. We can see him back in future should need arise. He is a longstanding patient of Dr. Bernie.  Manuelita Dutch, PA-C Advanced Heart Failure

## 2024-02-08 NOTE — Patient Instructions (Addendum)
 Increase torsemide to 80 mg daily - updated Rx sent. Please get labs drawn at your next visit with Dr. Bernie - added the request to your clinic appointment notes. Please call us  at [phone] if any questions or concerns prior to your next visit.

## 2024-02-11 DIAGNOSIS — J449 Chronic obstructive pulmonary disease, unspecified: Secondary | ICD-10-CM | POA: Diagnosis not present

## 2024-02-11 DIAGNOSIS — J9611 Chronic respiratory failure with hypoxia: Secondary | ICD-10-CM | POA: Diagnosis not present

## 2024-02-11 DIAGNOSIS — I5023 Acute on chronic systolic (congestive) heart failure: Secondary | ICD-10-CM | POA: Diagnosis not present

## 2024-02-11 DIAGNOSIS — E119 Type 2 diabetes mellitus without complications: Secondary | ICD-10-CM | POA: Diagnosis not present

## 2024-02-11 DIAGNOSIS — I110 Hypertensive heart disease with heart failure: Secondary | ICD-10-CM | POA: Diagnosis not present

## 2024-02-11 DIAGNOSIS — K921 Melena: Secondary | ICD-10-CM | POA: Diagnosis not present

## 2024-02-12 DIAGNOSIS — J9611 Chronic respiratory failure with hypoxia: Secondary | ICD-10-CM | POA: Diagnosis not present

## 2024-02-12 DIAGNOSIS — J449 Chronic obstructive pulmonary disease, unspecified: Secondary | ICD-10-CM | POA: Diagnosis not present

## 2024-02-12 DIAGNOSIS — I110 Hypertensive heart disease with heart failure: Secondary | ICD-10-CM | POA: Diagnosis not present

## 2024-02-12 DIAGNOSIS — K921 Melena: Secondary | ICD-10-CM | POA: Diagnosis not present

## 2024-02-12 DIAGNOSIS — I5023 Acute on chronic systolic (congestive) heart failure: Secondary | ICD-10-CM | POA: Diagnosis not present

## 2024-02-12 DIAGNOSIS — E119 Type 2 diabetes mellitus without complications: Secondary | ICD-10-CM | POA: Diagnosis not present

## 2024-02-13 ENCOUNTER — Telehealth: Payer: Self-pay

## 2024-02-13 NOTE — Telephone Encounter (Signed)
 Spoke with pt. He stated that he was feeling good and his weight is down. He reported that he finished the antibiotic and his UTI has cleared. Encouraged pt to call with any changes in his weight, shortness of breath or any questions. Pt verbalized understanding and had no further questions.

## 2024-02-14 DIAGNOSIS — I110 Hypertensive heart disease with heart failure: Secondary | ICD-10-CM | POA: Diagnosis not present

## 2024-02-14 DIAGNOSIS — E119 Type 2 diabetes mellitus without complications: Secondary | ICD-10-CM | POA: Diagnosis not present

## 2024-02-14 DIAGNOSIS — J9611 Chronic respiratory failure with hypoxia: Secondary | ICD-10-CM | POA: Diagnosis not present

## 2024-02-14 DIAGNOSIS — K921 Melena: Secondary | ICD-10-CM | POA: Diagnosis not present

## 2024-02-14 DIAGNOSIS — J449 Chronic obstructive pulmonary disease, unspecified: Secondary | ICD-10-CM | POA: Diagnosis not present

## 2024-02-14 DIAGNOSIS — I5023 Acute on chronic systolic (congestive) heart failure: Secondary | ICD-10-CM | POA: Diagnosis not present

## 2024-02-19 ENCOUNTER — Ambulatory Visit: Attending: Cardiology | Admitting: Cardiology

## 2024-02-19 ENCOUNTER — Encounter: Payer: Self-pay | Admitting: Cardiology

## 2024-02-19 VITALS — BP 120/70 | HR 72 | Ht 66.0 in | Wt 236.0 lb

## 2024-02-19 DIAGNOSIS — I110 Hypertensive heart disease with heart failure: Secondary | ICD-10-CM | POA: Diagnosis not present

## 2024-02-19 DIAGNOSIS — I5042 Chronic combined systolic (congestive) and diastolic (congestive) heart failure: Secondary | ICD-10-CM | POA: Insufficient documentation

## 2024-02-19 DIAGNOSIS — K921 Melena: Secondary | ICD-10-CM | POA: Diagnosis not present

## 2024-02-19 DIAGNOSIS — I10 Essential (primary) hypertension: Secondary | ICD-10-CM | POA: Insufficient documentation

## 2024-02-19 DIAGNOSIS — E785 Hyperlipidemia, unspecified: Secondary | ICD-10-CM | POA: Insufficient documentation

## 2024-02-19 DIAGNOSIS — J449 Chronic obstructive pulmonary disease, unspecified: Secondary | ICD-10-CM | POA: Diagnosis not present

## 2024-02-19 DIAGNOSIS — I5023 Acute on chronic systolic (congestive) heart failure: Secondary | ICD-10-CM | POA: Diagnosis not present

## 2024-02-19 DIAGNOSIS — E119 Type 2 diabetes mellitus without complications: Secondary | ICD-10-CM | POA: Diagnosis not present

## 2024-02-19 DIAGNOSIS — G4733 Obstructive sleep apnea (adult) (pediatric): Secondary | ICD-10-CM | POA: Insufficient documentation

## 2024-02-19 DIAGNOSIS — I2510 Atherosclerotic heart disease of native coronary artery without angina pectoris: Secondary | ICD-10-CM | POA: Diagnosis not present

## 2024-02-19 DIAGNOSIS — J9611 Chronic respiratory failure with hypoxia: Secondary | ICD-10-CM | POA: Diagnosis not present

## 2024-02-19 MED ORDER — LOSARTAN POTASSIUM 25 MG PO TABS: 25.0000 mg | ORAL_TABLET | Freq: Every day | ORAL | 3 refills | Status: DC

## 2024-02-19 MED ORDER — POTASSIUM CHLORIDE CRYS ER 20 MEQ PO TBCR: 20.0000 meq | EXTENDED_RELEASE_TABLET | Freq: Every day | ORAL | 1 refills | Status: DC

## 2024-02-19 NOTE — Patient Instructions (Addendum)
 Medication Instructions:  DECREASE: Potassium to 1 daily  START: Losartan  25mg  1 tablet daily     Lab Work: Your physician recommends that you return for lab work in: Monday Nov 24 You need to have labs done when you are fasting.  You can come Monday through Friday 8:30 am to 12:00 pm and 1:15 to 4:30. You do not need to make an appointment as the order has already been placed.     Testing/Procedures: None Ordered   Follow-Up: At [facility], you and your health needs are our priority.  As part of our continuing mission to provide you with exceptional heart care, we have created designated Provider Care Teams.  These Care Teams include your primary Cardiologist (physician) and Advanced Practice Providers (APPs -  Physician Assistants and Nurse Practitioners) who all work together to provide you with the care you need, when you need it.  We recommend signing up for the patient portal called MyChart.  Sign up information is provided on this After Visit Summary.  MyChart is used to connect with patients for Virtual Visits (Telemedicine).  Patients are able to view lab/test results, encounter notes, upcoming appointments, etc.  Non-urgent messages can be sent to your provider as well.   To learn more about what you can do with MyChart, go to [email or web address].    Your next appointment:   10 day(s)  The format for your next appointment:   In Person  Provider:   Lamar Fitch, MD    Other Instructions NA

## 2024-02-19 NOTE — Addendum Note (Signed)
 Addended by: ARLOA PLANAS D on: 02/19/2024 08:42 AM   Modules accepted: Orders

## 2024-02-19 NOTE — Progress Notes (Signed)
 Cardiology Office Note:    Date:  02/19/2024   ID:  Phillip Dunn, DOB May 09, 1950, MRN [id number]  PCP:  Keren Vicenta BRAVO, MD  Cardiologist:  Lamar Fitch, MD    Referring MD: Keren Vicenta BRAVO, MD   Chief Complaint  Patient presents with   Follow-up  Doing fine  History of Present Illness:    Phillip Dunn is a 73 y.o. male  with past medical history significant for congestive heart failure, ejection fraction 45% with inferior wall hypokinesis, type 2 diabetes, dyslipidemia, hypertension, history of MI, lumbar radiculopathy, obstructive sleep apnea on CPAP, COPD, venous insufficiency, recent history of GI bleed that required some intervention, recently discharged from [facility] where he was admitted with decompensated CHF. Excellent diuresis achieved 14 L negative, he lost 24 pounds.  Comes today 2 months for follow-up overall he looks good feels well.  Gained 2 pounds since last time I concerned about it.  We did have to stop fark [employer] because of a urinary tract infection.  That being treated with antibiotic doing well from that aspect.  Swelling of lower extremities acceptable, shortness of breath still there he complained of being weak and tired especially in the afternoon.  No chest pain tightness squeezing pressure in chest  Past Medical History:  Diagnosis Date   Abnormal endocrine laboratory test finding 08/02/2021   Adrenal disorder    Asthma    Benign hypertensive heart disease with CHF (congestive heart failure) (HCC)    Bradycardia    Carpal tunnel syndrome of right wrist 05/08/2019   Class 2 severe obesity with serious comorbidity and body mass index (BMI) of 39.0 to 39.9 in adult 08/08/2021   Coronary artery aneurysm 06/19/2017   Measuring 14 mm on CT from February 2019   Coronary artery disease    Coronary artery disease involving native coronary artery of native heart without angina pectoris 12/08/2014   Overview:  Multiple stents to circumflex  artery in March 18, 2014  Formatting of this note might be different from the original. 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   Myocardial infarct (HCC)    OSA (obstructive sleep apnea)    Secondary hypotension    Sleep apnea 11/28/2016   Strain of thoracic back region 01/17/2023   Strain of thoracic region 01/17/2023   Trigger finger, right middle finger 10/14/2019   Venous insufficiency of left lower extremity     Past Surgical History:  Procedure Laterality Date   CARDIAC CATHETERIZATION     CARPAL TUNNEL RELEASE Left    CATARACT EXTRACTION, BILATERAL     CORONARY ANGIOPLASTY     CORONARY ARTERY BYPASS GRAFT     Right Hip replacement Right 06/2020    Current Medications: Current Meds  Medication Sig   albuterol  (VENTOLIN  HFA) 108 (90 Base) MCG/ACT inhaler Inhale 2 puffs into the lungs every 4 (four) hours as needed for wheezing.   aspirin EC 81 MG tablet Take 81 mg by mouth daily.   atorvastatin (LIPITOR) 80 MG tablet Take 80 mg by mouth daily.   clopidogrel  (PLAVIX ) 75 MG tablet TAKE 1 TABLET EVERY DAY   Coenzyme Q10 (COQ-10) 10 MG  CAPS Take 10 mg by mouth daily.   ferrous sulfate 325 (65 FE) MG EC tablet Take 325 mg by mouth 2 (two) times daily.   finasteride  (PROSCAR ) 5 MG tablet Take 1 tablet (5 mg total) by mouth daily.   IPRATROPIUM BROMIDE IN Place 1 spray into both nostrils 2 (two) times daily.   KRILL OIL PO Take 1,000 mg by mouth daily.   MAGNESIUM-OXIDE 400 (240 Mg) MG tablet Take 1 tablet by mouth daily.   montelukast (SINGULAIR) 10 MG tablet Take 10 mg by mouth daily.   omeprazole (PRILOSEC) 40 MG capsule Take 40 mg by mouth 2 (two)  times daily.   OVER THE COUNTER MEDICATION Take 1 tablet by mouth daily. Tylenol ES   potassium chloride  SA (KLOR-CON  M) 20 MEQ tablet Take 2 tablets (40 mEq total) by mouth daily.   spironolactone (ALDACTONE) 25 MG tablet Take 1 tablet (25 mg total) by mouth daily.   SYMBICORT 160-4.5 MCG/ACT inhaler Inhale 2 puffs into the lungs 2 (two) times daily.   tamsulosin (FLOMAX) 0.4 MG CAPS capsule Take 0.4 mg by mouth daily.   torsemide (DEMADEX) 20 MG tablet Take 4 tablets (80 mg total) by mouth daily.   zolpidem (AMBIEN) 10 MG tablet Take 10 mg by mouth at bedtime.     Allergies:   Dapagliflozin, Grass pollen([id number] vern), Other, and Pollen extract   Social History   Socioeconomic History   Marital status: Married    Spouse name: Not on file   Number of children: Not on file   Years of education: Not on file   Highest education level: Not on file  Occupational History   Not on file  Tobacco Use   Smoking status: Former    Types: Cigarettes   Smokeless tobacco: Never  Vaping Use   Vaping status: Former  Substance and Sexual Activity   Alcohol use: Yes    Alcohol/week: 2.0 - 3.0 standard drinks of alcohol    Types: 2 - 3 Glasses of wine per week    Comment: daily   Drug use: No   Sexual activity: Not on file  Other Topics Concern   Not on file  Social History Narrative   Not on file   Social Drivers of Health   Financial Resource Strain: Not on file  Food Insecurity: No Food Insecurity (01/21/2024)   Hunger Vital Sign    Worried About Running Out of Food in the Last Year: Never true    Ran Out of Food in the Last Year: Never true  Transportation Needs: No Transportation Needs (01/21/2024)   PRAPARE - Administrator, Civil Service (Medical): No    Lack of Transportation (Non-Medical): No  Physical Activity: Not on file  Stress: Not on file  Social Connections: Moderately Isolated (01/21/2024)   Social Connection and Isolation Panel    Frequency of  Communication with Friends and Family: More than three times a week    Frequency of Social Gatherings with Friends and Family: Once a week    Attends Religious Services: Never    Database Administrator or Organizations: No    Attends Engineer, Structural: Never    Marital Status: Married     Family History: The patient's family history includes Stroke in his mother. ROS:   Please see the history of present illness.    All 14 point review of systems negative except as described per history of present illness  EKGs/Labs/Other Studies Reviewed:  Recent Labs: 09/17/2023: ALT 38 01/24/2024: Magnesium 2.0 02/06/2024: BUN 22; Creatinine, Ser 1.04; Hemoglobin 11.1; NT-Pro BNP 1,647; Platelets 229; Potassium 4.9; Sodium 142  Recent Lipid Panel    Component Value Date/Time   CHOL 114 02/01/2022 1546   TRIG 87 02/01/2022 1546   HDL 43 02/01/2022 1546   CHOLHDL 2.7 02/01/2022 1546   LDLCALC 54 02/01/2022 1546    Physical Exam:    VS:  BP 120/70   Pulse 72   Ht 5' 6 (1.676 m)   Wt 236 lb (107 kg)   SpO2 97%   BMI 38.09 kg/m     Wt Readings from Last 3 Encounters:  02/19/24 236 lb (107 kg)  02/08/24 231 lb (104.8 kg)  02/06/24 235 lb (106.6 kg)     GEN:  Well nourished, well developed in no acute distress HEENT: Normal NECK: No JVD; No carotid bruits LYMPHATICS: No lymphadenopathy CARDIAC: RRR, no murmurs, no rubs, no gallops RESPIRATORY:  Clear to auscultation without rales, wheezing or rhonchi  ABDOMEN: Soft, non-tender, non-distended MUSCULOSKELETAL:  No edema; No deformity  SKIN: Warm and dry LOWER EXTREMITIES: no swelling NEUROLOGIC:  Alert and oriented x 3 PSYCHIATRIC:  Normal affect   ASSESSMENT:    1. Chronic combined systolic and diastolic congestive heart failure (HCC)   2. Coronary artery disease involving native coronary artery of native heart without angina pectoris   3. Essential hypertension   4. OSA on CPAP   5. Dyslipidemia     PLAN:    In order of problems listed above:  Coronary disease: Stable from that point review. Congestive heart failure.  Will cut down his potassium to only 1 tablet 20 mg a day, we will put him on losartan  25 mg daily, Monday we will do Chem-7 and I see him back in about 10 days in the office. Most likely sleep apnea on CPAP continue present management. Dyslipidemia doing well from that aspect. He is essential hypertension is well-controlled   Medication Adjustments/Labs and Tests Ordered: Current medicines are reviewed at length with the patient today.  Concerns regarding medicines are outlined above.  No orders of the defined types were placed in this encounter.  Medication changes: No orders of the defined types were placed in this encounter.   Signed, Lamar DOROTHA Fitch, MD, [facility] 02/19/2024 8:20 AM    [facility]

## 2024-02-19 NOTE — Addendum Note (Signed)
 Addended by: ARLOA PLANAS D on: 02/19/2024 08:37 AM   Modules accepted: Orders

## 2024-02-20 DIAGNOSIS — I5023 Acute on chronic systolic (congestive) heart failure: Secondary | ICD-10-CM | POA: Diagnosis not present

## 2024-02-20 DIAGNOSIS — I110 Hypertensive heart disease with heart failure: Secondary | ICD-10-CM | POA: Diagnosis not present

## 2024-02-20 DIAGNOSIS — J449 Chronic obstructive pulmonary disease, unspecified: Secondary | ICD-10-CM | POA: Diagnosis not present

## 2024-02-20 DIAGNOSIS — J9611 Chronic respiratory failure with hypoxia: Secondary | ICD-10-CM | POA: Diagnosis not present

## 2024-02-20 DIAGNOSIS — E119 Type 2 diabetes mellitus without complications: Secondary | ICD-10-CM | POA: Diagnosis not present

## 2024-02-20 DIAGNOSIS — K921 Melena: Secondary | ICD-10-CM | POA: Diagnosis not present

## 2024-02-21 ENCOUNTER — Other Ambulatory Visit: Payer: Self-pay

## 2024-02-21 ENCOUNTER — Encounter: Payer: Self-pay | Admitting: Cardiology

## 2024-02-21 DIAGNOSIS — I5022 Chronic systolic (congestive) heart failure: Secondary | ICD-10-CM

## 2024-02-21 MED ORDER — SPIRONOLACTONE 25 MG PO TABS: 25.0000 mg | ORAL_TABLET | Freq: Every day | ORAL | 3 refills | Status: DC

## 2024-02-21 MED ORDER — TORSEMIDE 20 MG PO TABS: 80.0000 mg | ORAL_TABLET | Freq: Every day | ORAL | 3 refills | Status: DC

## 2024-02-21 MED ORDER — LOSARTAN POTASSIUM 25 MG PO TABS: 25.0000 mg | ORAL_TABLET | Freq: Every day | ORAL | 3 refills | Status: AC

## 2024-02-22 DIAGNOSIS — I5023 Acute on chronic systolic (congestive) heart failure: Secondary | ICD-10-CM | POA: Diagnosis not present

## 2024-02-22 DIAGNOSIS — I110 Hypertensive heart disease with heart failure: Secondary | ICD-10-CM | POA: Diagnosis not present

## 2024-02-22 DIAGNOSIS — E119 Type 2 diabetes mellitus without complications: Secondary | ICD-10-CM | POA: Diagnosis not present

## 2024-02-22 DIAGNOSIS — J9611 Chronic respiratory failure with hypoxia: Secondary | ICD-10-CM | POA: Diagnosis not present

## 2024-02-22 DIAGNOSIS — J449 Chronic obstructive pulmonary disease, unspecified: Secondary | ICD-10-CM | POA: Diagnosis not present

## 2024-02-22 DIAGNOSIS — K921 Melena: Secondary | ICD-10-CM | POA: Diagnosis not present

## 2024-02-25 DIAGNOSIS — K921 Melena: Secondary | ICD-10-CM | POA: Diagnosis not present

## 2024-02-25 DIAGNOSIS — J449 Chronic obstructive pulmonary disease, unspecified: Secondary | ICD-10-CM | POA: Diagnosis not present

## 2024-02-25 DIAGNOSIS — I110 Hypertensive heart disease with heart failure: Secondary | ICD-10-CM | POA: Diagnosis not present

## 2024-02-25 DIAGNOSIS — I10 Essential (primary) hypertension: Secondary | ICD-10-CM | POA: Diagnosis not present

## 2024-02-25 DIAGNOSIS — I5023 Acute on chronic systolic (congestive) heart failure: Secondary | ICD-10-CM | POA: Diagnosis not present

## 2024-02-25 DIAGNOSIS — J9611 Chronic respiratory failure with hypoxia: Secondary | ICD-10-CM | POA: Diagnosis not present

## 2024-02-25 DIAGNOSIS — E119 Type 2 diabetes mellitus without complications: Secondary | ICD-10-CM | POA: Diagnosis not present

## 2024-02-26 DIAGNOSIS — I110 Hypertensive heart disease with heart failure: Secondary | ICD-10-CM | POA: Diagnosis not present

## 2024-02-26 DIAGNOSIS — E119 Type 2 diabetes mellitus without complications: Secondary | ICD-10-CM | POA: Diagnosis not present

## 2024-02-26 DIAGNOSIS — I5023 Acute on chronic systolic (congestive) heart failure: Secondary | ICD-10-CM | POA: Diagnosis not present

## 2024-02-26 DIAGNOSIS — J9611 Chronic respiratory failure with hypoxia: Secondary | ICD-10-CM | POA: Diagnosis not present

## 2024-02-26 DIAGNOSIS — J449 Chronic obstructive pulmonary disease, unspecified: Secondary | ICD-10-CM | POA: Diagnosis not present

## 2024-02-26 DIAGNOSIS — K921 Melena: Secondary | ICD-10-CM | POA: Diagnosis not present

## 2024-02-26 LAB — BASIC METABOLIC PANEL WITH GFR
BUN/Creatinine Ratio: 27 — ABNORMAL HIGH (ref 10–24)
BUN: 20 mg/dL (ref 8–27)
CO2: 23 mmol/L (ref 20–29)
Calcium: 10.2 mg/dL (ref 8.6–10.2)
Chloride: 102 mmol/L (ref 96–106)
Creatinine, Ser: 0.75 mg/dL — ABNORMAL LOW (ref 0.76–1.27)
Glucose: 80 mg/dL (ref 70–99)
Potassium: 4.7 mmol/L (ref 3.5–5.2)
Sodium: 140 mmol/L (ref 134–144)
eGFR: 95 mL/min/1.73 (ref >59)

## 2024-02-27 ENCOUNTER — Telehealth: Payer: Self-pay

## 2024-02-27 ENCOUNTER — Ambulatory Visit: Payer: Self-pay | Admitting: Cardiology

## 2024-02-27 NOTE — Telephone Encounter (Signed)
 Pt viewed lab results on My Chart per Dr. Karry note. Routed to PCP.

## 2024-03-04 ENCOUNTER — Ambulatory Visit: Attending: Cardiology | Admitting: Cardiology

## 2024-03-04 ENCOUNTER — Encounter: Payer: Self-pay | Admitting: Cardiology

## 2024-03-04 VITALS — BP 108/66 | HR 62 | Ht 66.0 in | Wt 237.0 lb

## 2024-03-04 DIAGNOSIS — I110 Hypertensive heart disease with heart failure: Secondary | ICD-10-CM | POA: Diagnosis not present

## 2024-03-04 DIAGNOSIS — I2510 Atherosclerotic heart disease of native coronary artery without angina pectoris: Secondary | ICD-10-CM | POA: Diagnosis not present

## 2024-03-04 DIAGNOSIS — I5042 Chronic combined systolic (congestive) and diastolic (congestive) heart failure: Secondary | ICD-10-CM | POA: Insufficient documentation

## 2024-03-04 DIAGNOSIS — J9611 Chronic respiratory failure with hypoxia: Secondary | ICD-10-CM | POA: Diagnosis not present

## 2024-03-04 DIAGNOSIS — I10 Essential (primary) hypertension: Secondary | ICD-10-CM | POA: Diagnosis not present

## 2024-03-04 DIAGNOSIS — E782 Mixed hyperlipidemia: Secondary | ICD-10-CM | POA: Diagnosis not present

## 2024-03-04 DIAGNOSIS — G4733 Obstructive sleep apnea (adult) (pediatric): Secondary | ICD-10-CM | POA: Insufficient documentation

## 2024-03-04 DIAGNOSIS — I5023 Acute on chronic systolic (congestive) heart failure: Secondary | ICD-10-CM | POA: Diagnosis not present

## 2024-03-04 DIAGNOSIS — E119 Type 2 diabetes mellitus without complications: Secondary | ICD-10-CM | POA: Diagnosis not present

## 2024-03-04 DIAGNOSIS — J449 Chronic obstructive pulmonary disease, unspecified: Secondary | ICD-10-CM | POA: Diagnosis not present

## 2024-03-04 DIAGNOSIS — R0609 Other forms of dyspnea: Secondary | ICD-10-CM | POA: Diagnosis not present

## 2024-03-04 DIAGNOSIS — K921 Melena: Secondary | ICD-10-CM | POA: Diagnosis not present

## 2024-03-04 NOTE — Progress Notes (Signed)
 Cardiology Office Note:    Date:  03/04/2024   ID:  Phillip Dunn, DOB 1950/08/18, MRN [id number]  PCP:  Keren Vicenta BRAVO, MD  Cardiologist:  Lamar Fitch, MD    Referring MD: Keren Vicenta BRAVO, MD   Chief Complaint  Patient presents with   Follow-up  Doing fine  History of Present Illness:    Phillip Dunn is a 73 y.o. male  with past medical history significant for congestive heart failure, ejection fraction 45% with inferior wall hypokinesis, type 2 diabetes, dyslipidemia, hypertension, history of MI, lumbar radiculopathy, obstructive sleep apnea on CPAP, COPD, venous insufficiency, recent history of GI bleed that required some intervention, recently discharged from [facility] where he was admitted with decompensated CHF. Excellent diuresis achieved 14 L negative, he lost 24 pounds.  Comes today to months for follow-up overall doing better still fixed carburators,, denies have any chest pain tightness squeezing pressure burning chest.  Swelling of lower extremity still there but much better weight maintained stable  Past Medical History:  Diagnosis Date   Abnormal endocrine laboratory test finding 08/02/2021   Adrenal disorder    Asthma    Benign hypertensive heart disease with CHF (congestive heart failure) (HCC)    Bradycardia    Carpal tunnel syndrome of right wrist 05/08/2019   Class 2 severe obesity with serious comorbidity and body mass index (BMI) of 39.0 to 39.9 in adult 08/08/2021   Coronary artery aneurysm 06/19/2017   Measuring 14 mm on CT from February 2019   Coronary artery disease    Coronary artery disease involving native coronary artery of native heart without angina pectoris 12/08/2014   Overview:  Multiple stents to circumflex artery in March 18, 2014  Formatting of this note might be different from the original. Multiple stents to circumflex artery in March 18, 2014   Diabetes mellitus without complication (HCC)    Dyslipidemia  12/08/2014   Elevated cortisol level 08/02/2021   Ganglion cyst of dorsum of right wrist 07/07/2019   Hepatitis    Hyperlipidemia    Left wrist pain 05/07/2019   Low back pain 01/17/2023   Low serum adrenocorticotropic hormone (ACTH ) 08/08/2021   Myocardial infarct (HCC)    OSA (obstructive sleep apnea)    Secondary hypotension    Sleep apnea 11/28/2016   Strain of thoracic back region 01/17/2023   Strain of thoracic region 01/17/2023   Trigger finger, right middle finger 10/14/2019   Venous insufficiency of left lower extremity     Past Surgical History:  Procedure Laterality Date   CARDIAC CATHETERIZATION     CARPAL TUNNEL RELEASE Left    CATARACT EXTRACTION, BILATERAL     CORONARY ANGIOPLASTY     CORONARY ARTERY BYPASS GRAFT     Right Hip replacement Right 06/2020    Current Medications: Current Meds  Medication Sig   albuterol  (VENTOLIN  HFA) 108 (90 Base) MCG/ACT inhaler Inhale 2 puffs into the lungs every 4 (four) hours as needed for wheezing.   aspirin  EC 81 MG tablet Take 81 mg by mouth daily.   atorvastatin  (LIPITOR ) 80 MG tablet Take 80 mg by mouth daily.   clopidogrel  (PLAVIX ) 75 MG tablet TAKE 1 TABLET EVERY DAY   Coenzyme Q10 (COQ-10) 10 MG CAPS Take 10 mg by mouth daily.   ferrous sulfate  325 (65 FE) MG EC tablet Take 325 mg by mouth 2 (two) times daily.   finasteride  (PROSCAR ) 5 MG tablet Take 1 tablet (5 mg total) by mouth daily.  IPRATROPIUM BROMIDE  IN Place 1 spray into both nostrils 2 (two) times daily.   KRILL OIL PO Take 1,000 mg by mouth daily.   losartan  (COZAAR ) 25 MG tablet Take 1 tablet (25 mg total) by mouth daily.   MAGNESIUM -OXIDE 400 (240 Mg) MG tablet Take 1 tablet by mouth daily.   montelukast  (SINGULAIR ) 10 MG tablet Take 10 mg by mouth daily.   omeprazole (PRILOSEC) 40 MG capsule Take 40 mg by mouth 2 (two) times daily.   OVER THE COUNTER MEDICATION Take 1 tablet by mouth daily. Tylenol ES   potassium chloride  SA (KLOR-CON  M) 20 MEQ  tablet Take 1 tablet (20 mEq total) by mouth daily.   spironolactone  (ALDACTONE ) 25 MG tablet Take 1 tablet (25 mg total) by mouth daily.   SYMBICORT 160-4.5 MCG/ACT inhaler Inhale 2 puffs into the lungs 2 (two) times daily.   tamsulosin  (FLOMAX ) 0.4 MG CAPS capsule Take 0.4 mg by mouth daily.   torsemide  (DEMADEX ) 20 MG tablet Take 4 tablets (80 mg total) by mouth daily.   zolpidem  (AMBIEN ) 10 MG tablet Take 10 mg by mouth at bedtime.     Allergies:   Dapagliflozin , Grass pollen([id number] vern), Other, and Pollen extract   Social History   Socioeconomic History   Marital status: Married    Spouse name: Not on file   Number of children: Not on file   Years of education: Not on file   Highest education level: Not on file  Occupational History   Not on file  Tobacco Use   Smoking status: Former    Types: Cigarettes   Smokeless tobacco: Never  Vaping Use   Vaping status: Former  Substance and Sexual Activity   Alcohol use: Yes    Alcohol/week: 2.0 - 3.0 standard drinks of alcohol    Types: 2 - 3 Glasses of wine per week    Comment: daily   Drug use: No   Sexual activity: Not on file  Other Topics Concern   Not on file  Social History Narrative   Not on file   Social Drivers of Health   Financial Resource Strain: Not on file  Food Insecurity: No Food Insecurity (01/21/2024)   Hunger Vital Sign    Worried About Running Out of Food in the Last Year: Never true    Ran Out of Food in the Last Year: Never true  Transportation Needs: No Transportation Needs (01/21/2024)   PRAPARE - Administrator, Civil Service (Medical): No    Lack of Transportation (Non-Medical): No  Physical Activity: Not on file  Stress: Not on file  Social Connections: Moderately Isolated (01/21/2024)   Social Connection and Isolation Panel    Frequency of Communication with Friends and Family: More than three times a week    Frequency of Social Gatherings with Friends and Family: Once a  week    Attends Religious Services: Never    Database Administrator or Organizations: No    Attends Engineer, Structural: Never    Marital Status: Married     Family History: The patient's family history includes Stroke in his mother. ROS:   Please see the history of present illness.    All 14 point review of systems negative except as described per history of present illness  EKGs/Labs/Other Studies Reviewed:         Recent Labs: 09/17/2023: ALT 38 01/24/2024: Magnesium  2.0 02/06/2024: Hemoglobin 11.1; NT-Pro BNP 1,647; Platelets 229 02/25/2024: BUN 20; Creatinine, Ser  0.75; Potassium 4.7; Sodium 140  Recent Lipid Panel    Component Value Date/Time   CHOL 114 02/01/2022 1546   TRIG 87 02/01/2022 1546   HDL 43 02/01/2022 1546   CHOLHDL 2.7 02/01/2022 1546   LDLCALC 54 02/01/2022 1546    Physical Exam:    VS:  BP 108/66   Pulse 62   Ht 5' 6 (1.676 m)   Wt 237 lb (107.5 kg)   SpO2 93%   BMI 38.25 kg/m     Wt Readings from Last 3 Encounters:  03/04/24 237 lb (107.5 kg)  02/19/24 236 lb (107 kg)  02/08/24 231 lb (104.8 kg)     GEN:  Well nourished, well developed in no acute distress HEENT: Normal NECK: No JVD; No carotid bruits LYMPHATICS: No lymphadenopathy CARDIAC: RRR, no murmurs, no rubs, no gallops RESPIRATORY:  Clear to auscultation without rales, wheezing or rhonchi  ABDOMEN: Soft, non-tender, non-distended MUSCULOSKELETAL:  No edema; No deformity  SKIN: Warm and dry LOWER EXTREMITIES: no swelling NEUROLOGIC:  Alert and oriented x 3 PSYCHIATRIC:  Normal affect   ASSESSMENT:    1. Chronic combined systolic and diastolic congestive heart failure (HCC)   2. Essential hypertension   3. Coronary artery disease involving native coronary artery of native heart without angina pectoris   4. OSA on CPAP   5. Mixed hyperlipidemia    PLAN:    In order of problems listed above:  Chronic systolic diastolic congestive heart failure.  Seems to be  compensated on physical exam.  Will continue present management, check Chem-7 proBNP today. Essential hypertension blood pressure well-controlled. Coronary disease denies of any chest pain tightness squeezing pressure burning chest. Obstructive sleep apnea, on CPAP mask, that being taken care of by primary care physician. Mixed dyslipidemia I will continue with Lipitor  80, last fasting lipid profile have is from October 28 which show LDL 44 HDL 52 continue present management   Medication Adjustments/Labs and Tests Ordered: Current medicines are reviewed at length with the patient today.  Concerns regarding medicines are outlined above.  No orders of the defined types were placed in this encounter.  Medication changes: No orders of the defined types were placed in this encounter.   Signed, Lamar DOROTHA Fitch, MD, [facility] 03/04/2024 9:56 AM     [facility]

## 2024-03-04 NOTE — Patient Instructions (Signed)
 Medication Instructions:  Your physician recommends that you continue on your current medications as directed. Please refer to the Current Medication list given to you today.  *If you need a refill on your cardiac medications before your next appointment, please call your pharmacy*   Lab Work: BMP, ProBNP- today If you have labs (blood work) drawn today and your tests are completely normal, you will receive your results only by: MyChart Message (if you have MyChart) OR A paper copy in the mail If you have any lab test that is abnormal or we need to change your treatment, we will call you to review the results.   Testing/Procedures: None Ordered   Follow-Up: At [facility], you and your health needs are our priority.  As part of our continuing mission to provide you with exceptional heart care, we have created designated Provider Care Teams.  These Care Teams include your primary Cardiologist (physician) and Advanced Practice Providers (APPs -  Physician Assistants and Nurse Practitioners) who all work together to provide you with the care you need, when you need it.  We recommend signing up for the patient portal called "MyChart".  Sign up information is provided on this After Visit Summary.  MyChart is used to connect with patients for Virtual Visits (Telemedicine).  Patients are able to view lab/test results, encounter notes, upcoming appointments, etc.  Non-urgent messages can be sent to your provider as well.   To learn more about what you can do with MyChart, go to [email or web address].    Your next appointment:   6 week(s)  The format for your next appointment:   In Person  Provider:   Gypsy Balsam, MD    Other Instructions NA

## 2024-03-04 NOTE — Addendum Note (Signed)
 Addended by: ARLOA PLANAS D on: 03/04/2024 10:04 AM   Modules accepted: Orders

## 2024-03-05 LAB — PRO B NATRIURETIC PEPTIDE: NT-Pro BNP: 663 pg/mL — ABNORMAL HIGH (ref 0–376)

## 2024-03-05 LAB — BASIC METABOLIC PANEL WITH GFR
BUN/Creatinine Ratio: 22 (ref 10–24)
BUN: 20 mg/dL (ref 8–27)
CO2: 24 mmol/L (ref 20–29)
Calcium: 10.3 mg/dL — ABNORMAL HIGH (ref 8.6–10.2)
Chloride: 101 mmol/L (ref 96–106)
Creatinine, Ser: 0.91 mg/dL (ref 0.76–1.27)
Glucose: 90 mg/dL (ref 70–99)
Potassium: 4.7 mmol/L (ref 3.5–5.2)
Sodium: 141 mmol/L (ref 134–144)
eGFR: 89 mL/min/1.73 (ref >59)

## 2024-03-07 ENCOUNTER — Telehealth: Payer: Self-pay

## 2024-03-07 ENCOUNTER — Ambulatory Visit: Payer: Self-pay | Admitting: Cardiology

## 2024-03-07 DIAGNOSIS — R918 Other nonspecific abnormal finding of lung field: Secondary | ICD-10-CM | POA: Diagnosis not present

## 2024-03-07 DIAGNOSIS — J449 Chronic obstructive pulmonary disease, unspecified: Secondary | ICD-10-CM | POA: Diagnosis not present

## 2024-03-07 DIAGNOSIS — G4733 Obstructive sleep apnea (adult) (pediatric): Secondary | ICD-10-CM | POA: Diagnosis not present

## 2024-03-07 DIAGNOSIS — J301 Allergic rhinitis due to pollen: Secondary | ICD-10-CM | POA: Diagnosis not present

## 2024-03-07 NOTE — Telephone Encounter (Signed)
 Left message on My Chart with lab results per Dr. Karry note. Routed to PCP

## 2024-03-10 ENCOUNTER — Encounter: Payer: Self-pay | Admitting: Cardiology

## 2024-03-10 DIAGNOSIS — R300 Dysuria: Secondary | ICD-10-CM | POA: Diagnosis not present

## 2024-03-12 ENCOUNTER — Telehealth: Payer: Self-pay

## 2024-03-12 NOTE — Telephone Encounter (Signed)
 Pt viewed lab results on My Chart per Dr. Karry note. Routed to PCP.

## 2024-03-13 ENCOUNTER — Ambulatory Visit: Admitting: Cardiology

## 2024-04-02 ENCOUNTER — Other Ambulatory Visit (HOSPITAL_COMMUNITY): Payer: Self-pay

## 2024-04-02 MED ORDER — POTASSIUM CHLORIDE CRYS ER 20 MEQ PO TBCR: 20.0000 meq | EXTENDED_RELEASE_TABLET | Freq: Every day | ORAL | 1 refills | Status: AC

## 2024-04-02 MED ORDER — SPIRONOLACTONE 25 MG PO TABS: 25.0000 mg | ORAL_TABLET | Freq: Every day | ORAL | 1 refills | Status: AC

## 2024-04-09 ENCOUNTER — Encounter: Payer: Self-pay | Admitting: Cardiology

## 2024-04-11 ENCOUNTER — Other Ambulatory Visit (HOSPITAL_COMMUNITY): Payer: Self-pay | Admitting: Cardiology

## 2024-04-11 DIAGNOSIS — I5022 Chronic systolic (congestive) heart failure: Secondary | ICD-10-CM

## 2024-04-11 MED ORDER — TORSEMIDE 20 MG PO TABS: 80.0000 mg | ORAL_TABLET | Freq: Every day | ORAL | 3 refills | Status: AC

## 2024-04-15 ENCOUNTER — Encounter: Payer: Self-pay | Admitting: Cardiology

## 2024-04-15 ENCOUNTER — Ambulatory Visit: Attending: Cardiology | Admitting: Cardiology

## 2024-04-15 VITALS — BP 100/70 | HR 65 | Ht 66.0 in | Wt 233.0 lb

## 2024-04-15 DIAGNOSIS — I2541 Coronary artery aneurysm: Secondary | ICD-10-CM | POA: Diagnosis present

## 2024-04-15 DIAGNOSIS — Z9861 Coronary angioplasty status: Secondary | ICD-10-CM | POA: Insufficient documentation

## 2024-04-15 DIAGNOSIS — I10 Essential (primary) hypertension: Secondary | ICD-10-CM | POA: Diagnosis not present

## 2024-04-15 DIAGNOSIS — E785 Hyperlipidemia, unspecified: Secondary | ICD-10-CM | POA: Insufficient documentation

## 2024-04-15 DIAGNOSIS — I2510 Atherosclerotic heart disease of native coronary artery without angina pectoris: Secondary | ICD-10-CM | POA: Diagnosis present

## 2024-04-15 DIAGNOSIS — G4733 Obstructive sleep apnea (adult) (pediatric): Secondary | ICD-10-CM | POA: Diagnosis not present

## 2024-04-15 DIAGNOSIS — I5023 Acute on chronic systolic (congestive) heart failure: Secondary | ICD-10-CM | POA: Insufficient documentation

## 2024-04-15 NOTE — Patient Instructions (Signed)

## 2024-04-15 NOTE — Progress Notes (Signed)
 " Cardiology Office Note:    Date:  04/15/2024   ID:  Phillip Dunn, DOB 11/08/1950, MRN [id number]  PCP:  Keren Vicenta BRAVO, MD  Cardiologist:  Lamar Fitch, MD    Referring MD: Keren Vicenta BRAVO, MD   Chief Complaint  Patient presents with   Follow-up  Doing fine cardiac wise  History of Present Illness:    Phillip Dunn is a 74 y.o. male past medical history significant for cardiomyopathy with ejection fraction 45% with inferior wall hypokinesis, type 2 diabetes, dyslipidemia, hypertension, lumbar radiculopathy, obstructive sleep apnea on CPAP, COPD, venous insufficiency, history of GI bleed comes today to my office for follow-up, cardiac wise seems to be doing well weight is stable swelling is manageable, he does have some lesion of his scrotum and he is very concerned about it.  He is scheduled to see urologist but appointment has not been scheduled yet.  He did have a CT of his abdomen.  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  Past Medical History:  Diagnosis Date   Abnormal endocrine laboratory test finding 08/02/2021   Adrenal disorder    Asthma    Benign hypertensive heart disease with CHF (congestive heart failure) (HCC)    Bradycardia    Carpal tunnel syndrome of right wrist 05/08/2019   Class 2 severe obesity with serious comorbidity and body mass index (BMI) of 39.0 to 39.9 in adult 08/08/2021   Coronary artery aneurysm 06/19/2017   Measuring 14 mm on CT from February 2019   Coronary artery disease    Coronary artery disease involving native coronary artery of native heart without angina pectoris 12/08/2014   Overview:  Multiple stents to circumflex artery in March 18, 2014  Formatting of this note might be different from the original. Multiple stents to circumflex artery in March 18, 2014   Diabetes mellitus without complication (HCC)    Dyslipidemia  12/08/2014   Elevated cortisol level 08/02/2021   Ganglion cyst of dorsum of right wrist 07/07/2019   Hepatitis    Hyperlipidemia    Left wrist pain 05/07/2019   Low back pain 01/17/2023   Low serum adrenocorticotropic hormone (ACTH ) 08/08/2021   Myocardial infarct (HCC)    OSA (obstructive sleep apnea)    Secondary hypotension    Sleep apnea 11/28/2016   Strain of thoracic back region 01/17/2023   Strain of thoracic region 01/17/2023   Trigger finger, right middle finger 10/14/2019   Venous insufficiency of left lower extremity     Past Surgical History:  Procedure Laterality Date   CARDIAC CATHETERIZATION     CARPAL TUNNEL RELEASE Left    CATARACT EXTRACTION, BILATERAL     CORONARY ANGIOPLASTY     CORONARY ARTERY BYPASS GRAFT     Right Hip replacement Right 06/2020    Current Medications: Active Medications[1]   Allergies:   Dapagliflozin , Grass pollen(k-o-r-t-swt vern), Other, and Pollen extract   Social History   Socioeconomic History   Marital status: Married    Spouse name: Not on file   Number of children: Not on file   Years of education: Not on file   Highest education level: Not on file  Occupational History   Not on file  Tobacco Use   Smoking status: Former    Types: Cigarettes   Smokeless tobacco: Never  Vaping Use   Vaping status: Former  Substance and Sexual Activity   Alcohol use: Yes    Alcohol/week:  2.0 - 3.0 standard drinks of alcohol    Types: 2 - 3 Glasses of wine per week    Comment: daily   Drug use: No   Sexual activity: Not on file  Other Topics Concern   Not on file  Social History Narrative   Not on file   Social Drivers of Health   Tobacco Use: Medium Risk (04/15/2024)   Patient History    Smoking Tobacco Use: Former    Smokeless Tobacco Use: Never    Passive Exposure: Not on Actuary Strain: Not on file  Food Insecurity: No Food Insecurity (01/21/2024)   Epic    Worried About Programme Researcher, Broadcasting/film/video in the  Last Year: Never true    Ran Out of Food in the Last Year: Never true  Transportation Needs: No Transportation Needs (01/21/2024)   Epic    Lack of Transportation (Medical): No    Lack of Transportation (Non-Medical): No  Physical Activity: Not on file  Stress: Not on file  Social Connections: Moderately Isolated (01/21/2024)   Social Connection and Isolation Panel    Frequency of Communication with Friends and Family: More than three times a week    Frequency of Social Gatherings with Friends and Family: Once a week    Attends Religious Services: Never    Database Administrator or Organizations: No    Attends Banker Meetings: Never    Marital Status: Married  Depression (PHQ2-9): Not on file  Alcohol Screen: Not on file  Housing: Low Risk (01/21/2024)   Epic    Unable to Pay for Housing in the Last Year: No    Number of Times Moved in the Last Year: 0    Homeless in the Last Year: No  Utilities: Not At Risk (01/21/2024)   Epic    Threatened with loss of utilities: No  Health Literacy: Not on file     Family History: The patient's family history includes Stroke in his mother. ROS:   Please see the history of present illness.    All 14 point review of systems negative except as described per history of present illness  EKGs/Labs/Other Studies Reviewed:         Recent Labs: 09/17/2023: ALT 38 01/24/2024: Magnesium  2.0 02/06/2024: Hemoglobin 11.1; Platelets 229 03/04/2024: BUN 20; Creatinine, Ser 0.91; NT-Pro BNP 663; Potassium 4.7; Sodium 141  Recent Lipid Panel    Component Value Date/Time   CHOL 114 02/01/2022 1546   TRIG 87 02/01/2022 1546   HDL 43 02/01/2022 1546   CHOLHDL 2.7 02/01/2022 1546   LDLCALC 54 02/01/2022 1546    Physical Exam:    VS:  BP 100/70   Pulse 65   Ht 5' 6 (1.676 m)   Wt 233 lb (105.7 kg)   SpO2 95%   BMI 37.61 kg/m     Wt Readings from Last 3 Encounters:  04/15/24 233 lb (105.7 kg)  03/04/24 237 lb (107.5 kg)   02/19/24 236 lb (107 kg)     GEN:  Well nourished, well developed in no acute distress HEENT: Normal NECK: No JVD; No carotid bruits LYMPHATICS: No lymphadenopathy CARDIAC: RRR, no murmurs, no rubs, no gallops RESPIRATORY:  Clear to auscultation without rales, wheezing or rhonchi  ABDOMEN: Soft, non-tender, non-distended MUSCULOSKELETAL:  No edema; No deformity  SKIN: Warm and dry LOWER EXTREMITIES: no swelling NEUROLOGIC:  Alert and oriented x 3 PSYCHIATRIC:  Normal affect   ASSESSMENT:    1. Acute  on chronic systolic CHF (congestive heart failure) (HCC)   2. CAD S/P percutaneous coronary angioplasty   3. Coronary artery aneurysm   4. Essential hypertension   5. OSA on CPAP   6. Dyslipidemia    PLAN:    In order of problems listed above:  Congestive heart failure.  Compensated on physical exam continue present management. Coronary disease stable on guideline directed medical therapy. Coronary risk noted. Essential hypertension blood pressure well-controlled. Obstructive sleep apnea follow-up by internal medicine team. Dyslipidemia I did review [facility] which show me data from October with LDL 44 HDL 52 continue present management   Medication Adjustments/Labs and Tests Ordered: Current medicines are reviewed at length with the patient today.  Concerns regarding medicines are outlined above.  No orders of the defined types were placed in this encounter.  Medication changes: No orders of the defined types were placed in this encounter.   Signed, Lamar DOROTHA Fitch, MD, [facility] 04/15/2024 10:34 AM    [facility]    [1]  Current Meds  Medication Sig   albuterol  (VENTOLIN  HFA) 108 (90 Base) MCG/ACT inhaler Inhale 2 puffs into the lungs every 4 (four) hours as needed for wheezing.   aspirin  EC 81 MG tablet Take 81 mg by mouth daily.   atorvastatin  (LIPITOR ) 80 MG tablet Take 80 mg by mouth daily.   ciprofloxacin (CIPRO) 250 MG tablet Take 250 mg by  mouth 2 (two) times daily.   clopidogrel  (PLAVIX ) 75 MG tablet TAKE 1 TABLET EVERY DAY   Coenzyme Q10 (COQ-10) 10 MG CAPS Take 10 mg by mouth daily.   ferrous sulfate  325 (65 FE) MG EC tablet Take 325 mg by mouth 2 (two) times daily.   finasteride  (PROSCAR ) 5 MG tablet Take 1 tablet (5 mg total) by mouth daily.   IPRATROPIUM BROMIDE  IN Place 1 spray into both nostrils 2 (two) times daily.   KRILL OIL PO Take 1,000 mg by mouth daily.   losartan  (COZAAR ) 25 MG tablet Take 1 tablet (25 mg total) by mouth daily.   MAGNESIUM -OXIDE 400 (240 Mg) MG tablet Take 1 tablet by mouth daily.   montelukast  (SINGULAIR ) 10 MG tablet Take 10 mg by mouth daily.   omeprazole (PRILOSEC) 40 MG capsule Take 40 mg by mouth 2 (two) times daily.   OVER THE COUNTER MEDICATION Take 1 tablet by mouth daily. Tylenol ES   potassium chloride  SA (KLOR-CON  M) 20 MEQ tablet Take 1 tablet (20 mEq total) by mouth daily.   spironolactone  (ALDACTONE ) 25 MG tablet Take 1 tablet (25 mg total) by mouth daily.   SYMBICORT 160-4.5 MCG/ACT inhaler Inhale 2 puffs into the lungs 2 (two) times daily.   tamsulosin  (FLOMAX ) 0.4 MG CAPS capsule Take 0.4 mg by mouth daily.   torsemide  (DEMADEX ) 20 MG tablet Take 4 tablets (80 mg total) by mouth daily.   zolpidem  (AMBIEN ) 10 MG tablet Take 10 mg by mouth at bedtime.   "

## 2024-04-16 ENCOUNTER — Encounter: Payer: Self-pay | Admitting: Cardiology

## 2024-07-14 ENCOUNTER — Ambulatory Visit: Admitting: Cardiology
